# Patient Record
Sex: Male | Born: 1950 | Race: White | Hispanic: No | Marital: Single
Health system: Midwestern US, Community
[De-identification: ages and names within clinical notes are randomized; demographics above are authoritative.]

## PROBLEM LIST (undated history)

## (undated) DIAGNOSIS — E785 Hyperlipidemia, unspecified: Secondary | ICD-10-CM

## (undated) DIAGNOSIS — K219 Gastro-esophageal reflux disease without esophagitis: Secondary | ICD-10-CM

## (undated) DIAGNOSIS — Z87442 Personal history of urinary calculi: Secondary | ICD-10-CM

## (undated) DIAGNOSIS — N200 Calculus of kidney: Secondary | ICD-10-CM

## (undated) DIAGNOSIS — R0789 Other chest pain: Secondary | ICD-10-CM

## (undated) DIAGNOSIS — E669 Obesity, unspecified: Secondary | ICD-10-CM

## (undated) DIAGNOSIS — I10 Essential (primary) hypertension: Secondary | ICD-10-CM

## (undated) DIAGNOSIS — M1990 Unspecified osteoarthritis, unspecified site: Secondary | ICD-10-CM

## (undated) DIAGNOSIS — I350 Nonrheumatic aortic (valve) stenosis: Secondary | ICD-10-CM

## (undated) DIAGNOSIS — R011 Cardiac murmur, unspecified: Secondary | ICD-10-CM

## (undated) DIAGNOSIS — R739 Hyperglycemia, unspecified: Secondary | ICD-10-CM

## (undated) DIAGNOSIS — R5383 Other fatigue: Secondary | ICD-10-CM

## (undated) DIAGNOSIS — Z0000 Encounter for general adult medical examination without abnormal findings: Secondary | ICD-10-CM

## (undated) DIAGNOSIS — N529 Male erectile dysfunction, unspecified: Secondary | ICD-10-CM

## (undated) DIAGNOSIS — E039 Hypothyroidism, unspecified: Secondary | ICD-10-CM

## (undated) DIAGNOSIS — R1012 Left upper quadrant pain: Secondary | ICD-10-CM

## (undated) DIAGNOSIS — R5381 Other malaise: Secondary | ICD-10-CM

## (undated) DIAGNOSIS — L089 Local infection of the skin and subcutaneous tissue, unspecified: Secondary | ICD-10-CM

## (undated) DIAGNOSIS — Z13220 Encounter for screening for lipoid disorders: Secondary | ICD-10-CM

## (undated) DIAGNOSIS — S81812A Laceration without foreign body, left lower leg, initial encounter: Secondary | ICD-10-CM

## (undated) DIAGNOSIS — M25512 Pain in left shoulder: Secondary | ICD-10-CM

## (undated) DIAGNOSIS — M79602 Pain in left arm: Secondary | ICD-10-CM

## (undated) DIAGNOSIS — Z139 Encounter for screening, unspecified: Secondary | ICD-10-CM

## (undated) DIAGNOSIS — K635 Polyp of colon: Secondary | ICD-10-CM

## (undated) DIAGNOSIS — M79675 Pain in left toe(s): Principal | ICD-10-CM

## (undated) DIAGNOSIS — L309 Dermatitis, unspecified: Secondary | ICD-10-CM

## (undated) HISTORY — DX: Essential (primary) hypertension: I10

## (undated) HISTORY — DX: Calculus of kidney: N20.0

## (undated) HISTORY — PX: APPENDECTOMY: SHX54

## (undated) HISTORY — DX: Atherosclerotic heart disease of native coronary artery without angina pectoris: I25.10

## (undated) HISTORY — DX: Hyperglycemia, unspecified: R73.9

## (undated) HISTORY — DX: Obesity, unspecified: E66.9

## (undated) HISTORY — DX: Hyperlipidemia, unspecified: E78.5

## (undated) HISTORY — DX: Unspecified osteoarthritis, unspecified site: M19.90

## (undated) HISTORY — DX: Other chest pain: R07.89

## (undated) HISTORY — DX: Nonrheumatic aortic (valve) stenosis: I35.0

---

## 1999-10-03 ENCOUNTER — Ambulatory Visit (HOSPITAL_COMMUNITY): Admission: RE | Admit: 1999-10-03 | Discharge: 1999-10-03 | Payer: Self-pay | Admitting: Cardiology

## 2001-02-28 ENCOUNTER — Encounter: Payer: Self-pay | Admitting: Emergency Medicine

## 2001-02-28 ENCOUNTER — Emergency Department (HOSPITAL_COMMUNITY): Admission: EM | Admit: 2001-02-28 | Discharge: 2001-02-28 | Payer: Self-pay | Admitting: Emergency Medicine

## 2001-08-11 ENCOUNTER — Ambulatory Visit (HOSPITAL_BASED_OUTPATIENT_CLINIC_OR_DEPARTMENT_OTHER): Admission: RE | Admit: 2001-08-11 | Discharge: 2001-08-11 | Payer: Self-pay | Admitting: Orthopedic Surgery

## 2007-05-16 ENCOUNTER — Ambulatory Visit: Payer: Self-pay | Admitting: Gastroenterology

## 2007-05-26 ENCOUNTER — Encounter: Payer: Self-pay | Admitting: Gastroenterology

## 2007-05-26 ENCOUNTER — Ambulatory Visit: Payer: Self-pay | Admitting: Gastroenterology

## 2008-07-30 ENCOUNTER — Encounter: Admission: RE | Admit: 2008-07-30 | Discharge: 2008-07-30 | Payer: Self-pay | Admitting: Emergency Medicine

## 2010-04-17 ENCOUNTER — Encounter: Payer: Self-pay | Admitting: Emergency Medicine

## 2010-08-08 NOTE — Assessment & Plan Note (Signed)
[facility]                         GASTROENTEROLOGY OFFICE NOTE   NAME:[name], [name]                     MRN:          [id number]  DATE:05/16/2007                            DOB:          07-30-1950    REASON FOR CONSULTATION:  Colorectal cancer screening on Aggrenox.   HISTORY OF PRESENT ILLNESS:  Malik [name] is a 60 year old white male  who relates a history of a prior cerebrovascular accident.  He is  followed by Dr. Avie [name] and he has been maintained on Aggrenox for  about the past 18 months.  He states he has occasional hemorrhoidal  swelling but no bleeding or itching.  He notes no change in bowel  movements, melena, hematochezia, or change in stool caliber.  There is  no family history of colon cancer, colon polyps, or inflammatory bowel  disease.   PAST MEDICAL HISTORY:  1. Hypertension.  2. Prior cerebrovascular accident.  3. Possible sleep apnea.   PAST SURGICAL HISTORY:  1. Status post left knee surgery for repair of a meniscus tear, 2003.  2. Status post appendectomy, 1968.   CURRENT MEDICATIONS:  Listed on the chart updated and reviewed.   MEDICATION ALLERGIES:  None known.   SOCIAL HISTORY:  Per the handwritten form.   REVIEW OF SYSTEMS:  Per the handwritten form.   PHYSICAL EXAMINATION:  GENERAL:  An obese white male in no acute  distress.  VITAL SIGNS:  Height 6 feet, weight 332.6 pounds, blood pressure is  116/80, pulse 64 and regular.  HEENT:  Anicteric sclerae.  Oropharynx clear.  CHEST:  Clear to auscultation bilaterally.  CARDIAC:  Regular rate and rhythm without murmurs appreciated.  ABDOMEN:  Large, soft, nontender, nondistended.  Normoactive bowel  sounds.  No palpable organomegaly, masses, or hernias.  RECTAL:  Deferred to time of colonoscopy.  EXTREMITIES:  Without clubbing, cyanosis, or edema.  NEUROLOGIC:  Alert and oriented x3.  Grossly nonfocal.   ASSESSMENT/PLAN:  Average risk for colon cancer.   Colonoscopy  recommended for colorectal cancer screening.  Risks, benefits, and  alternatives to colonoscopy with possible biopsy and possible  polypectomy, performed off Aggrenox for 7 days, discussed with the  patient and he consents to proceed.  This will be scheduled electively.  We will obtain clearance from Dr. Sandria [name] for a temporary hold on Aggrenox  with plan to restart Aggrenox on the day of the procedure.     Venita Lick. Russella Dar, MD, [facility]  Electronically Signed    MTS/MedQ  DD: 05/16/2007  DT: 05/17/2007  Job #: [id number]   cc:   Malik Lacks, MD  Stan Head Malik [name], M.D.

## 2010-08-11 NOTE — Cardiovascular Report (Signed)
[facility]  Patient:    Malik [name], Malik [name]                     MRN: [id number] Proc. Date: 10/03/99 Adm. Date:  [id number] Attending:  [place], Peter Manning CC:         [name], M.D.                        Cardiac Catheterization  INDICATION FOR PROCEDURE:  The patient is a 60 year old white male, who presents with refractory chest pain symptoms.  Prior adenosine Cardiolite study was equivocal.  The patient has multiple cardiac risk factors including hyperlipidemia, hypertension, obesity, and family history of early coronary disease.  ACCESS:  Via the right femoral artery using the standard Seldinger technique.  EQUIPMENT:  A 6 French 4 cm right and left Judkins catheter, 6 French pigtail catheter, 6 French arterial sheath.  MEDICATIONS:  Local anesthesia with 1% Xylocaine.  CONTRAST:  Omnipaque 140 cc.  HEMODYNAMIC DATA:  Aortic pressure is 130/86, left ventricular pressure is 126 with an EDP of 20.  ANGIOGRAPHIC DATA:  Left coronary artery:  The left coronary artery arises and distributes normally.  Left main:  The left main coronary artery is normal.  Left anterior descending:  The left anterior descending artery is normal.  Left circumflex:  The left circumflex coronary artery gives rise to a single large obtuse marginal vessel and then proceeds in the AV groove.  The circumflex distribution has minor wall irregularities less than 10%.  Right coronary artery:  The right coronary artery arises and distributes normally and is a dominant vessel.  There are minor wall irregularities in the proximal mid vessel that are less than 10%.  LEFT VENTRICULAR ANGIOGRAPHY:  The left ventricular angiography was performed in the RAO view.  This demonstrates normal left ventricular size and contractility with normal systolic function.  The ejection fraction is difficult to estimate due to catheter-induced PVCs, but appears within normal limits,  55-65%.  There is no significant mitral insufficiency.  FINAL INTERPRETATION: 1. No significant atherosclerotic coronary artery disease. 2. Normal left ventricular function. DD:  10/03/99 TD:  10/03/99 Job: [id number]

## 2011-05-09 ENCOUNTER — Other Ambulatory Visit: Payer: Self-pay | Admitting: Family Medicine

## 2011-05-09 MED ORDER — SIMVASTATIN 80 MG PO TABS: 80.0000 mg | ORAL_TABLET | Freq: Every day | ORAL | Status: DC

## 2011-05-10 ENCOUNTER — Other Ambulatory Visit: Payer: Self-pay

## 2011-05-10 MED ORDER — METOPROLOL SUCCINATE ER 100 MG PO TB24: 100.0000 mg | ORAL_TABLET | Freq: Every day | ORAL | Status: DC

## 2011-07-10 ENCOUNTER — Ambulatory Visit: Payer: Self-pay | Admitting: Emergency Medicine

## 2011-08-12 ENCOUNTER — Other Ambulatory Visit: Payer: Self-pay | Admitting: Emergency Medicine

## 2011-08-21 ENCOUNTER — Encounter: Payer: Self-pay | Admitting: Emergency Medicine

## 2011-08-21 ENCOUNTER — Ambulatory Visit: Payer: 59

## 2011-08-21 ENCOUNTER — Ambulatory Visit (INDEPENDENT_AMBULATORY_CARE_PROVIDER_SITE_OTHER): Payer: 59 | Admitting: Emergency Medicine

## 2011-08-21 VITALS — BP 129/76 | HR 68 | Temp 96.4°F | Resp 16 | Ht 71.5 in | Wt 321.0 lb

## 2011-08-21 DIAGNOSIS — E785 Hyperlipidemia, unspecified: Secondary | ICD-10-CM

## 2011-08-21 DIAGNOSIS — I6529 Occlusion and stenosis of unspecified carotid artery: Secondary | ICD-10-CM

## 2011-08-21 DIAGNOSIS — M1990 Unspecified osteoarthritis, unspecified site: Secondary | ICD-10-CM

## 2011-08-21 DIAGNOSIS — M159 Polyosteoarthritis, unspecified: Secondary | ICD-10-CM

## 2011-08-21 DIAGNOSIS — R739 Hyperglycemia, unspecified: Secondary | ICD-10-CM

## 2011-08-21 DIAGNOSIS — R7989 Other specified abnormal findings of blood chemistry: Secondary | ICD-10-CM

## 2011-08-21 DIAGNOSIS — E782 Mixed hyperlipidemia: Secondary | ICD-10-CM

## 2011-08-21 DIAGNOSIS — I10 Essential (primary) hypertension: Secondary | ICD-10-CM

## 2011-08-21 LAB — LIPID PANEL
Cholesterol: 115 mg/dL (ref 0–200)
HDL: 23 mg/dL — ABNORMAL LOW (ref >39)
Total CHOL/HDL Ratio: 5 Ratio
VLDL: 20 mg/dL (ref 0–40)

## 2011-08-21 LAB — COMPREHENSIVE METABOLIC PANEL
AST: 18 U/L (ref 0–37)
Albumin: 4.3 g/dL (ref 3.5–5.2)
Alkaline Phosphatase: 58 U/L (ref 39–117)
BUN: 17 mg/dL (ref 6–23)
Potassium: 4 mEq/L (ref 3.5–5.3)
Total Bilirubin: 0.7 mg/dL (ref 0.3–1.2)

## 2011-08-21 MED ORDER — LISINOPRIL 20 MG PO TABS: 20.0000 mg | ORAL_TABLET | Freq: Every day | ORAL | Status: DC

## 2011-08-21 MED ORDER — HYDROCHLOROTHIAZIDE 25 MG PO TABS: 25.0000 mg | ORAL_TABLET | Freq: Every day | ORAL | Status: DC

## 2011-08-21 MED ORDER — MELOXICAM 15 MG PO TABS: 15.0000 mg | ORAL_TABLET | Freq: Every day | ORAL | Status: AC

## 2011-08-21 MED ORDER — METOPROLOL SUCCINATE ER 100 MG PO TB24: 100.0000 mg | ORAL_TABLET | Freq: Every day | ORAL | Status: DC

## 2011-08-21 MED ORDER — SIMVASTATIN 80 MG PO TABS: 80.0000 mg | ORAL_TABLET | Freq: Every day | ORAL | Status: DC

## 2011-08-21 NOTE — Progress Notes (Signed)
  Subjective:    Patient ID: Malik [name], male    DOB: [birth year], 61 y.o.   MRN: [id number]  HPI    Review of Systems     Objective:   Physical Exam    [facility] reading (PRIMARY) by  Dr. Cleta Alberts x-ray reveals narrowing of the medial joint space without other acute abnormality . Results for orders placed in visit on 08/21/11  GLUCOSE, POCT (MANUAL RESULT ENTRY)      Component Value Range   POC Glucose 99  70 - 99 (mg/dl)  POCT GLYCOSYLATED HEMOGLOBIN (HGB A1C)      Component Value Range   Hemoglobin A1C 5.6       Assessment & Plan:  Patient stable. meloxicam refilled

## 2011-08-24 ENCOUNTER — Ambulatory Visit
Admission: RE | Admit: 2011-08-24 | Discharge: 2011-08-24 | Disposition: A | Discharge status: Home / Self Care | Payer: 59 | Source: Ambulatory Visit | Attending: Emergency Medicine | Admitting: Emergency Medicine

## 2011-08-24 DIAGNOSIS — I6529 Occlusion and stenosis of unspecified carotid artery: Secondary | ICD-10-CM

## 2011-08-29 ENCOUNTER — Telehealth: Payer: Self-pay | Admitting: Radiology

## 2011-08-29 NOTE — Telephone Encounter (Signed)
lmom of normal results. 

## 2011-08-29 NOTE — Telephone Encounter (Signed)
Message copied by Luretha Murphy on Wed Aug 29, 2011  8:48 AM ------      Message from: [name] A      Created: Fri Aug 24, 2011  7:57 PM       Please call patient let them know there was no significant stenosis on his ultrasound study.

## 2011-09-25 ENCOUNTER — Other Ambulatory Visit: Payer: Self-pay | Admitting: *Deleted

## 2011-09-25 DIAGNOSIS — I10 Essential (primary) hypertension: Secondary | ICD-10-CM

## 2011-09-25 MED ORDER — HYDROCHLOROTHIAZIDE 25 MG PO TABS: 25.0000 mg | ORAL_TABLET | Freq: Every day | ORAL | Status: DC

## 2011-12-18 ENCOUNTER — Ambulatory Visit (INDEPENDENT_AMBULATORY_CARE_PROVIDER_SITE_OTHER): Payer: 59 | Admitting: Emergency Medicine

## 2011-12-18 VITALS — BP 113/78 | HR 67 | Temp 96.8°F | Resp 16 | Ht 72.0 in | Wt 324.0 lb

## 2011-12-18 DIAGNOSIS — M1990 Unspecified osteoarthritis, unspecified site: Secondary | ICD-10-CM

## 2011-12-18 DIAGNOSIS — R739 Hyperglycemia, unspecified: Secondary | ICD-10-CM | POA: Insufficient documentation

## 2011-12-18 DIAGNOSIS — E669 Obesity, unspecified: Secondary | ICD-10-CM | POA: Insufficient documentation

## 2011-12-18 DIAGNOSIS — E785 Hyperlipidemia, unspecified: Secondary | ICD-10-CM

## 2011-12-18 DIAGNOSIS — R7309 Other abnormal glucose: Secondary | ICD-10-CM

## 2011-12-18 DIAGNOSIS — I10 Essential (primary) hypertension: Secondary | ICD-10-CM

## 2011-12-18 DIAGNOSIS — Z23 Encounter for immunization: Secondary | ICD-10-CM

## 2011-12-18 LAB — POCT GLYCOSYLATED HEMOGLOBIN (HGB A1C): Hemoglobin A1C: 6

## 2011-12-18 NOTE — Progress Notes (Signed)
  Subjective:    Patient ID: Malik [name], male    DOB: 1950/10/31, 61 y.o.   MRN: [id number]  HPI patient enters for followup of high blood pressure hyperlipidemia, hyperglycemia. Last hemoglobin A1c was in range. Since his last visit here he has done great well without complaints he denies chest pain shortness of breath problems with his lower extremities. He continues to exercise everyday.    Review of Systems     Objective:   Physical Exam HEENT exam is unremarkable. His neck supple without carotid bruits. Chest is clear to auscultation and percussion. Cardiac exam is without murmur extremity exam reveals onychomycosis dorsalis pedis posterior tibial pulses 2+ without peripheral edema.  Results for orders placed in visit on 12/18/11  GLUCOSE, POCT (MANUAL RESULT ENTRY)      Component Value Range   POC Glucose 105 (*) 70 - 99 mg/dl  POCT GLYCOSYLATED HEMOGLOBIN (HGB A1C)      Component Value Range   Hemoglobin A1C 6.0          Assessment & Plan:  Stable at present hemoglobin A1c is at goal. No change in medications at present we'll do a complete physical in 3-4 months.

## 2011-12-18 NOTE — Patient Instructions (Signed)
Continued to work on diet and exercise and weight loss. No change in current medications. Recheck in 3-4 months with complete physical at that time.

## 2012-05-06 ENCOUNTER — Ambulatory Visit (INDEPENDENT_AMBULATORY_CARE_PROVIDER_SITE_OTHER): Payer: 59 | Admitting: Emergency Medicine

## 2012-05-06 ENCOUNTER — Ambulatory Visit: Payer: 59

## 2012-05-06 ENCOUNTER — Encounter: Payer: Self-pay | Admitting: Emergency Medicine

## 2012-05-06 VITALS — BP 138/78 | HR 74 | Temp 97.6°F | Resp 16 | Ht 72.0 in | Wt 322.0 lb

## 2012-05-06 DIAGNOSIS — E785 Hyperlipidemia, unspecified: Secondary | ICD-10-CM

## 2012-05-06 DIAGNOSIS — M79643 Pain in unspecified hand: Secondary | ICD-10-CM

## 2012-05-06 DIAGNOSIS — M79609 Pain in unspecified limb: Secondary | ICD-10-CM

## 2012-05-06 DIAGNOSIS — E119 Type 2 diabetes mellitus without complications: Secondary | ICD-10-CM

## 2012-05-06 LAB — LIPID PANEL
Cholesterol: 132 mg/dL (ref 0–200)
Triglycerides: 121 mg/dL (ref <150)
VLDL: 24 mg/dL (ref 0–40)

## 2012-05-06 NOTE — Progress Notes (Signed)
  Subjective:    Patient ID: Malik [name], male    DOB: 1950-10-05, 62 y.o.   MRN: [id number]  HPI patient here to recheck diabetes high blood high cholesterol. He may have altered some over the holidays but overall has been doing well. He continues to exercise regularly. He has had no chest pain shortness of breath or swelling in his legs. Sensation maintains normal feeling in his legs    Review of Systems     Objective:   Physical Exam HEENT exam is unremarkable. His neck is supple. Chest clear to auscultation and percussion. Heart regular rate no murmurs. Abdomen obese without masses extremities he does have normal filament testing he does have significant onychomycosis  [facility] reading (PRIMARY) by  Dr.Daub from film is normal there are 2 accessory bones around the MCP joint of the thumb       Results for orders placed in visit on 05/06/12  GLUCOSE, POCT (MANUAL RESULT ENTRY)      Result Value Range   POC Glucose 103 (*) 70 - 99 mg/dl  POCT GLYCOSYLATED HEMOGLOBIN (HGB A1C)      Result Value Range   Hemoglobin A1C 5.6     Assessment & Plan:  Patient stable at present he does not want to see the orthopedist. No changes in medication. His hemoglobin A1c is 5.6 which is normal due to his continued exercise program

## 2012-08-28 ENCOUNTER — Telehealth: Payer: Self-pay | Admitting: Radiology

## 2012-08-28 DIAGNOSIS — I10 Essential (primary) hypertension: Secondary | ICD-10-CM

## 2012-08-28 MED ORDER — HYDROCHLOROTHIAZIDE 25 MG PO TABS: 25.0000 mg | ORAL_TABLET | Freq: Every day | ORAL | Status: DC

## 2012-08-28 MED ORDER — LISINOPRIL 20 MG PO TABS: 20.0000 mg | ORAL_TABLET | Freq: Every day | ORAL | Status: DC

## 2012-08-28 NOTE — Telephone Encounter (Signed)
Patients meds sent in for one month supply he is due for follow up prior to additional refills.

## 2012-09-16 ENCOUNTER — Encounter: Payer: Self-pay | Admitting: Emergency Medicine

## 2012-09-16 ENCOUNTER — Ambulatory Visit (INDEPENDENT_AMBULATORY_CARE_PROVIDER_SITE_OTHER): Payer: 59 | Admitting: Emergency Medicine

## 2012-09-16 VITALS — BP 130/84 | HR 71 | Temp 97.6°F | Resp 16 | Ht 72.0 in | Wt 325.4 lb

## 2012-09-16 DIAGNOSIS — E782 Mixed hyperlipidemia: Secondary | ICD-10-CM

## 2012-09-16 DIAGNOSIS — Z0000 Encounter for general adult medical examination without abnormal findings: Secondary | ICD-10-CM

## 2012-09-16 DIAGNOSIS — I10 Essential (primary) hypertension: Secondary | ICD-10-CM

## 2012-09-16 DIAGNOSIS — R011 Cardiac murmur, unspecified: Secondary | ICD-10-CM

## 2012-09-16 DIAGNOSIS — E785 Hyperlipidemia, unspecified: Secondary | ICD-10-CM

## 2012-09-16 LAB — CBC WITH DIFFERENTIAL/PLATELET
Eosinophils Relative: 3 % (ref 0–5)
HCT: 46.2 % (ref 39.0–52.0)
Hemoglobin: 16 g/dL (ref 13.0–17.0)
Lymphocytes Relative: 31 % (ref 12–46)
Lymphs Abs: 1.5 10*3/uL (ref 0.7–4.0)
MCV: 88 fL (ref 78.0–100.0)
Monocytes Absolute: 0.6 10*3/uL (ref 0.1–1.0)
Monocytes Relative: 11 % (ref 3–12)
RBC: 5.25 MIL/uL (ref 4.22–5.81)
RDW: 13.6 % (ref 11.5–15.5)
WBC: 5 10*3/uL (ref 4.0–10.5)

## 2012-09-16 LAB — POCT URINALYSIS DIPSTICK
Bilirubin, UA: NEGATIVE
Ketones, UA: NEGATIVE
Protein, UA: NEGATIVE
Spec Grav, UA: 1.015

## 2012-09-16 LAB — COMPREHENSIVE METABOLIC PANEL
Albumin: 4.4 g/dL (ref 3.5–5.2)
BUN: 14 mg/dL (ref 6–23)
CO2: 30 mEq/L (ref 19–32)
Calcium: 10.6 mg/dL — ABNORMAL HIGH (ref 8.4–10.5)
Chloride: 99 mEq/L (ref 96–112)
Creat: 1.08 mg/dL (ref 0.50–1.35)
Potassium: 4.1 mEq/L (ref 3.5–5.3)

## 2012-09-16 LAB — TSH: TSH: 2.383 u[IU]/mL (ref 0.350–4.500)

## 2012-09-16 LAB — LIPID PANEL
HDL: 31 mg/dL — ABNORMAL LOW (ref >39)
Triglycerides: 152 mg/dL — ABNORMAL HIGH (ref <150)

## 2012-09-16 LAB — PSA: PSA: 0.62 ng/mL (ref <=4.00)

## 2012-09-16 MED ORDER — SIMVASTATIN 80 MG PO TABS: 80.0000 mg | ORAL_TABLET | Freq: Every day | ORAL | Status: DC

## 2012-09-16 MED ORDER — METOPROLOL SUCCINATE ER 100 MG PO TB24: 100.0000 mg | ORAL_TABLET | Freq: Every day | ORAL | Status: DC

## 2012-09-16 MED ORDER — HYDROCHLOROTHIAZIDE 25 MG PO TABS: 25.0000 mg | ORAL_TABLET | Freq: Every day | ORAL | Status: DC

## 2012-09-16 MED ORDER — LISINOPRIL 20 MG PO TABS: 20.0000 mg | ORAL_TABLET | Freq: Every day | ORAL | Status: DC

## 2012-09-16 NOTE — Progress Notes (Signed)
  Subjective:    Patient ID: Malik [name], male    DOB: 02/18/1951, 62 y.o.   MRN: [id number]  HPI    Review of Systems  Constitutional: Negative.   HENT: Negative.   Eyes: Negative.   Respiratory: Negative.   Cardiovascular: Negative.   Gastrointestinal: Negative.   Endocrine: Negative.   Genitourinary: Negative.   Musculoskeletal: Negative.   Skin: Negative.   Allergic/Immunologic: Negative.   Neurological: Negative.   Hematological: Negative.   Psychiatric/Behavioral: Negative.        Objective:   Physical Exam reveals an overweight gentleman who is in no distress. Pupils are equal and reactive. TMs are clear. Throat is normal. Chest clear to auscultation percussion. Heart regular rate there is a 2/6 systolic murmur at the left are more. Abdomen is obese without tenderness genitourinary is normal. Rectal exam is normal prostate. He was sure was obtained.  Results for orders placed in visit on 09/16/12  IFOBT (OCCULT BLOOD)      Result Value Range   IFOBT Negative    POCT URINALYSIS DIPSTICK      Result Value Range   Color, UA yellow     Clarity, UA clear     Glucose, UA neg     Bilirubin, UA neg     Ketones, UA neg     Spec Grav, UA 1.015     Blood, UA trace     pH, UA 5.5     Protein, UA neg     Urobilinogen, UA 0.2     Nitrite, UA neg     Leukocytes, UA Negative          Assessment & Plan:  Meds refilled for one year recheck 3-4 months .

## 2012-09-23 LAB — HEMOGLOBIN A1C
Estimated Avg Glucose: 108 mg/dL
Hemoglobin A1C: 5.4 % (ref 4.0–6.0)

## 2012-09-23 LAB — DHEA-SULFATE: DHEAS (DHEA Sulfate): 54.1 ug/dL (ref 42–290)

## 2012-09-26 LAB — ESTROGENS, FRACTIONATED
Estradiol: 20.1 pg/mL
Estrogen Total: 40.8 pg/mL
Estrone: 20.7 pg/mL

## 2012-11-17 ENCOUNTER — Ambulatory Visit (INDEPENDENT_AMBULATORY_CARE_PROVIDER_SITE_OTHER): Payer: 59 | Admitting: Cardiovascular Disease

## 2012-11-17 ENCOUNTER — Encounter: Payer: Self-pay | Admitting: *Deleted

## 2012-11-17 VITALS — BP 148/90 | HR 83 | Wt 326.0 lb

## 2012-11-17 DIAGNOSIS — R011 Cardiac murmur, unspecified: Secondary | ICD-10-CM | POA: Insufficient documentation

## 2012-11-17 DIAGNOSIS — R0600 Dyspnea, unspecified: Secondary | ICD-10-CM | POA: Insufficient documentation

## 2012-11-17 DIAGNOSIS — E785 Hyperlipidemia, unspecified: Secondary | ICD-10-CM

## 2012-11-17 DIAGNOSIS — I10 Essential (primary) hypertension: Secondary | ICD-10-CM

## 2012-11-17 DIAGNOSIS — E669 Obesity, unspecified: Secondary | ICD-10-CM

## 2012-11-17 NOTE — Patient Instructions (Addendum)
  Your physician recommends that you schedule a follow-up appointment in: AS NEEDED  Your physician recommends that you continue on your current medications as directed. Please refer to the Current Medication list given to you today.  Your physician has requested that you have an exercise tolerance test. For further information please visit [email or web address] Please also follow instruction sheet, as given.   Your physician has requested that you have an echocardiogram. Echocardiography is a painless test that uses sound waves to create images of your heart. It provides your doctor with information about the size and shape of your heart and how well your heart's chambers and valves are working. This procedure takes approximately one hour. There are no restrictions for this procedure. CALCIUM  SCORE ONLY OUT OF  POCKET  $150.00

## 2012-11-17 NOTE — Assessment & Plan Note (Signed)
Cholesterol is at goal.  Continue current dose of statin and diet Rx.  No myalgias or side effects.  F/U  LFT's in 6 months. Lab Results  Component Value Date   LDLCALC 71 09/16/2012

## 2012-11-17 NOTE — Assessment & Plan Note (Signed)
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

## 2012-11-17 NOTE — Assessment & Plan Note (Signed)
Discussed low carb diet and lap band surgery with patient.  He has lots of work to do on diet and exercise

## 2012-11-17 NOTE — Progress Notes (Signed)
Patient ID: Malik [name], male   DOB: 1950-04-30, 62 y.o.   MRN: [id number] 62 yo with HTN and elevated lipids  Referred by primary Daub for murmur  Not previously described He is a retired Theatre stage manager.  He has bad knees that limits exercise His diet is poor and he likes to eat too much.  Exertional dyspnea worse over the last year.  Compliant with meds.  No chest pain but limited activity due to knee pain and dyspnea  TC 132 LDL 71 from 09/17/12  ROS: Denies fever, malais, weight loss, blurry vision, decreased visual acuity, cough, sputum, SOB, hemoptysis, pleuritic pain, palpitaitons, heartburn, abdominal pain, melena, lower extremity edema, claudication, or rash.  All other systems reviewed and negative   General: Affect appropriate Obese white male  HEENT: normal Neck supple with no adenopathy JVP normal no bruits no thyromegaly Lungs clear with no wheezing and good diaphragmatic motion Heart:  S1/S2 systolic ejection murmur 3/6 no ,rub, gallop or click PMI normal Abdomen: benighn, BS positve, no tenderness, no AAA no bruit.  No HSM or HJR Distal pulses intact with no bruits No edema Neuro non-focal Skin warm and dry No muscular weakness  Medications Current Outpatient Prescriptions  Medication Sig Dispense Refill  . aspirin 325 MG EC tablet Take 325 mg by mouth daily.      . hydrochlorothiazide (HYDRODIURIL) 25 MG tablet Take 1 tablet (25 mg total) by mouth daily. NEED REFILLS FOR 1 YEAR  90 tablet  3  . lisinopril (PRINIVIL,ZESTRIL) 20 MG tablet Take 1 tablet (20 mg total) by mouth daily. NEED REFILLS FOR 1 YEAR  90 tablet  3  . metoprolol succinate (TOPROL-XL) 100 MG 24 hr tablet Take 1 tablet (100 mg total) by mouth daily. Take with or immediately following a meal. NEED REFILLS FOR 1 YEAR  90 tablet  3  . simvastatin (ZOCOR) 80 MG tablet Take 1 tablet (80 mg total) by mouth at bedtime. NEED REFILLS FOR  1 YEAR  90 tablet  3   No current facility-administered medications  for this visit.    Allergies Review of patient's allergies indicates no known allergies.  Family History: Family History  Problem Relation Age of Onset  . Cancer Mother   . Heart disease Father     Social History: History   Social History  . Marital Status: Divorced    Spouse Name: N/A    Number of Children: N/A  . Years of Education: N/A   Occupational History  . Not on file.   Social History Main Topics  . Smoking status: Former Smoker -- 20 years    Types: Cigarettes, Cigars    Quit date: 08/19/1978  . Smokeless tobacco: Not on file  . Alcohol Use: No  . Drug Use: Not on file  . Sexual Activity: Not on file   Other Topics Concern  . Not on file   Social History Narrative  . No narrative on file    Electrocardiogram:  SR rate 83 normal ECG   Assessment and Plan

## 2012-11-17 NOTE — Assessment & Plan Note (Signed)
Likely from obesity  F/U ETT  To clear for exercise program and possible lab band eval.

## 2012-11-17 NOTE — Assessment & Plan Note (Signed)
Well controlled.  Continue current medications and low sodium Dash type diet.    

## 2012-11-25 ENCOUNTER — Encounter

## 2012-11-25 MED ORDER — SILDENAFIL CITRATE 100 MG PO TABS
100 | ORAL_TABLET | ORAL | Status: DC | PRN
Start: 2012-11-25 — End: 2012-11-28

## 2012-11-25 NOTE — Telephone Encounter (Signed)
rx printed; ok to fax

## 2012-11-25 NOTE — Telephone Encounter (Signed)
PT CALLING INTO REQUEST WE FAX OVER A NEW ORDER FOR HIS VIAGRA TO [facility] [phone] PLEASE ADVISE

## 2012-11-28 ENCOUNTER — Encounter

## 2012-12-02 MED ORDER — SILDENAFIL CITRATE 100 MG PO TABS
100 | ORAL_TABLET | ORAL | Status: DC | PRN
Start: 2012-12-02 — End: 2014-05-13

## 2012-12-02 NOTE — Telephone Encounter (Signed)
Pt needs to pick up viagra rx to [facility] to [facility], since it is in [place].    [facility]

## 2012-12-02 NOTE — Telephone Encounter (Signed)
Ok per [employer]

## 2012-12-03 MED ORDER — SILDENAFIL CITRATE 100 MG PO TABS
100 | ORAL_TABLET | ORAL | Status: DC | PRN
Start: 2012-12-03 — End: 2012-12-05

## 2012-12-03 NOTE — Telephone Encounter (Signed)
This is done

## 2012-12-05 ENCOUNTER — Encounter: Payer: Self-pay | Admitting: *Deleted

## 2012-12-05 MED ORDER — SILDENAFIL CITRATE 100 MG PO TABS
100 | ORAL_TABLET | ORAL | Status: DC | PRN
Start: 2012-12-05 — End: 2013-11-26

## 2012-12-09 ENCOUNTER — Ambulatory Visit (INDEPENDENT_AMBULATORY_CARE_PROVIDER_SITE_OTHER): Payer: 59 | Admitting: Physician Assistant

## 2012-12-09 DIAGNOSIS — R0602 Shortness of breath: Secondary | ICD-10-CM

## 2012-12-09 DIAGNOSIS — R0600 Dyspnea, unspecified: Secondary | ICD-10-CM

## 2012-12-09 NOTE — Progress Notes (Signed)
Exercise Treadmill Test  Pre-Exercise Testing Evaluation Rhythm: normal sinus  Rate: 69     Test  Exercise Tolerance Test Ordering MD: [name], MD  Interpreting MD: Tereso Newcomer, PA-C  Unique Test No: 1  Treadmill:  1  Indication for ETT: exertional dyspnea  Contraindication to ETT: No   Stress Modality: exercise - treadmill  Cardiac Imaging Performed: non   Protocol: modified Bruce - maximal  Max BP:  192/57  Max MPHR (bpm):  158 85% MPR (bpm):  134  MPHR obtained (bpm):  141 % MPHR obtained:  89  Reached 85% MPHR (min:sec):  10:59 Total Exercise Time (min-sec):  12:00  Workload in METS:  7.0 Borg Scale: 15  Reason ETT Terminated:  patient's desire to stop    ST Segment Analysis At Rest: normal ST segments - no evidence of significant ST depression With Exercise: no evidence of significant ST depression  Other Information Arrhythmia:  No Angina during ETT:  absent (0) Quality of ETT:  diagnostic  ETT Interpretation:  normal - no evidence of ischemia by ST analysis  Comments: Fair exercise capacity. No chest pain. Normal BP response to exercise. No significant ST-T changes to suggest ischemia.   Recommendations: F/u with Dr. [name] as directed. Signed,  Tereso Newcomer, PA-C   12/09/2012 3:38 PM

## 2012-12-09 NOTE — Progress Notes (Signed)
Normal ETT 

## 2012-12-12 ENCOUNTER — Ambulatory Visit (HOSPITAL_COMMUNITY): Payer: 59 | Attending: Cardiology | Admitting: Radiology

## 2012-12-12 ENCOUNTER — Other Ambulatory Visit (HOSPITAL_COMMUNITY): Payer: 59 | Admitting: Radiology

## 2012-12-12 ENCOUNTER — Ambulatory Visit (INDEPENDENT_AMBULATORY_CARE_PROVIDER_SITE_OTHER)
Admission: RE | Admit: 2012-12-12 | Discharge: 2012-12-12 | Disposition: A | Discharge status: Home / Self Care | Payer: Self-pay | Source: Ambulatory Visit | Attending: Cardiovascular Disease | Admitting: Cardiovascular Disease

## 2012-12-12 DIAGNOSIS — R0609 Other forms of dyspnea: Secondary | ICD-10-CM

## 2012-12-12 DIAGNOSIS — R011 Cardiac murmur, unspecified: Secondary | ICD-10-CM

## 2012-12-12 DIAGNOSIS — I079 Rheumatic tricuspid valve disease, unspecified: Secondary | ICD-10-CM | POA: Insufficient documentation

## 2012-12-12 DIAGNOSIS — E669 Obesity, unspecified: Secondary | ICD-10-CM

## 2012-12-12 DIAGNOSIS — R0600 Dyspnea, unspecified: Secondary | ICD-10-CM

## 2012-12-12 MED ORDER — PERFLUTREN PROTEIN A MICROSPH IV SUSP
1.5000 mL | Freq: Once | INTRAVENOUS | Status: AC
  Administered 2012-12-12: 1.5 mL via INTRAVENOUS

## 2012-12-12 NOTE — Progress Notes (Signed)
Echocardiogram performed with [employer].  

## 2012-12-16 ENCOUNTER — Telehealth: Payer: Self-pay | Admitting: *Deleted

## 2012-12-16 NOTE — Telephone Encounter (Signed)
[name] More Detail >>      Wendall Stade, MD      Sent: Sat December 13, 2012  2:02 PM      To: Collene Gobble, MD; Alois Cliche, LPN              Message      [name] - make sure patient knows to have f/u no contrast CT in a year for LUL lung nodule seen by radiologist on calcium score.           Attached Reports    The sender attached the following reports to this message:                     Results   CT Cardiac Scoring (Order [id number])        CT Cardiac Scoring  Status: Edited Result - FINAL     Visible to patient: This result is not viewable by the patient.     Next appt: 01/06/2013 at 08:45 AM in Family Medicine (DAUB, Stan Head, MD)     Dx: Dyspnea; Obesity            Details    Reading Physician Reading Date Result Priority    Wendall Stade, MD [name], MD 12/12/2012 12/12/2012         Addendum    **ADDENDUM** CREATED: 12/12/2012 13:13:58  OVER-READ INTERPRETATION - CT CHEST  The following report is an over-read performed by radiologist Dr. [name], M.D. of [facility] on 12/12/2012 13:13:58.  This over-read does not include interpretation of cardiac or coronary anatomy or pathology.  The CTA interpretation by the cardiologist is attached.  Comparison:  07/30/2008  Findings: Within the left upper lobe there is a 5 mm, image 9/series 4.  This is new when compared with the previous exam.  No airspace consolidation or atelectasis identified.  The heart size appears normal.  There is no pericardial effusion.  No adenopathy identified.  The visualized portions of the esophagus appear normal.  Coronary artery calcifications are noted involving the LAD, left circumflex, and RCA coronary arteries.  Spondylosis is identified within the thoracic spine.  IMPRESSION:  1.  5 mm nodule is identified in the left upper lobe.  Not seen on previous exam. If the patient is at high risk for bronchogenic carcinoma,  follow-up chest CT at 6-12 months is recommended.  If the patient is at low risk for bronchogenic carcinoma, follow-up chest CT at 12 months is recommended.  This recommendation follows the consensus statement: Guidelines for Management of Small Pulmonary Nodules Detected on CT Scans: A Statement from the Fleischner Society as published in Radiology 2005; 237:395-400.  These results will be called to the ordering clinician or representative by the Radiologist Assistant, and communication documented in the PACS Dashboard.  **END ADDENDUM** SIGNED BY: [name], M.D.    Signed by [name], MD on 12/12/2012  1:20 PM       Narrative        Coronary Calcium Score:  Indication: Risk Stratification  Protocol:  The patient was scanned on a Siemens Sensation 16 slice scanner.  3mm axial non contrast images were carried out through the heart. The data set was scored on a dedicated work station using the [employer]   Findings:  Ascneding Aorta some calcification and mildly dilated 3.8 cm  Pericardium Normal  Calcium Score 141 scattered in the mid and  distal LAD , RCA and proximal circumflex  Impression:  Coronary calcium Score 141  This is 66th percentile for age and sex matched controls.  [name] MD [facility]  Original Report Authenticated By: [name], M.D.        Last Resulted: 12/12/12 10:19 AM                  Order Questions    Question Answer Comment    Reason for Exam (SYMPTOM  OR DIAGNOSIS REQUIRED) DYSPNEA  OBESITY      Note:  Appropriate reason for exam on pre-op X-rays should indicate the reason for the surgery. Examples include: Coronary Artery disease, osteoartiritis of the knee, brain tumor, etc.    Preferred imaging location? [address]                  Reviewed by List    Wendall Stade, MD on 12/13/2012  2:07 PM        Encounter    View Encounter       Result Information    Status    Edited Result - FINAL (12/12/2012  10:19 AM)    Provider Status: Reviewed        Lab Information     RADIOLOGY               PACS Images    Show images for CT Cardiac Scoring       Signed      Addended on 12/12/12 at 1019 EDT   Electronically signed on 12/12/12 at 1019 EDT       Order-Level Documents:    There are no order-level documents.        CT Cardiac Scoring (Order [id number])  Imaging  Order: [id number]   Released By: Automatic Release User  Authorizing: Wendall Stade, MD   Date: 12/12/2012  Department: [facility] CT IMAGING [address]               Wendall Stade, MD  NPI: [id number]             Order Information    Order Date/Time Release Date/Time Start Date/Time End Date/Time    12/12/12 08:57 AM 12/12/12 08:57 AM 12/12/12 08:57 AM 12/12/2012       Order Details    Frequency Duration Priority Order Class    As needed 1  occurrence Routine Ancillary Performed       Comments    RM/SHARON X879/NO LABS NEEDED/COLLECT $150.00 AT TIME OF VISIT, PT AWARE) (ECHO TO FOLLOW CT)       Order Questions    Question Answer Comment    Reason for Exam (SYMPTOM  OR DIAGNOSIS REQUIRED) DYSPNEA  OBESITY      Note:  Appropriate reason for exam on pre-op X-rays should indicate the reason for the surgery. Examples include: Coronary Artery disease, osteoartiritis of the knee, brain tumor, etc.    Preferred imaging location? [place]          Order History Inpatient    Date/Time Action Taken User Additional Information     0000 Result Marily Lente In process    12/12/12 8469 Release Elsie Saas From Order: [id number]    12/12/12 1019 Result Rad Results In Interface Final-Edited    12/12/12 1019 Result Rad Results In Interface Final           Associated Diagnoses    Dyspnea       Obesity  Appointments for this Order    12/12/2012  9:00 AM  - 30 min Lbct-Ct 1 (Resource) L[facility] Imaging        Collection Information    Resulting Agency: [facility]                   Verbal Order Info    Action Order Mode Communicator Responsible Provider Signed By Signed On    Ordering Verbal with readback Alois Cliche, LPN Wendall Stade, MD Wendall Stade, MD 11/17/12 1224         Patient Information    Patient Name Sex DOB SSN    Malik [name], Malik [name] Male July 13, 1950 [id number]       Additional Information    Associated Reports    View Parent Encounter    Priority and Order Details       Reviewed by List    Wendall Stade, MD on 12/13/2012  2:07 PM       Order Provider Info        Office phone Pager/beeper E-mail    Ordering User Alois Cliche, LPN [phone] -- [email or web address]    Authorizing Provider Wendall Stade, MD [phone] -- [email or web address]    Billing Provider Wendall Stade, MD [phone] -- [email or web address]         Encounter    View Encounter        Order-Level Documents:    There are no order-level documents.      LMTCB .Zack Seal

## 2012-12-16 NOTE — Progress Notes (Signed)
[facility] [place]  [address]  [place] [address]  Dept: [phone]  Dept Fax: [phone]    Dustin Zimmerman is a 62 y.o. male who presents today for his medical conditions/complaints as noted below.  Dustin Zimmerman is c/o of Rash      Have you seen any other physician or provider since your last visit? yes - ophthalmologist     Have you had any other diagnostic tests since your last visit? no    Have you changed or stopped any medications since your last visit including any over-the-counter medicines, vitamins, or herbal medicines? no     Are you taking all your prescribed medications? Yes  If NO, why? -  N/A           Patient Self-Management Goal for this visit.   What is your goal for your visit today? - discuss referral   Barriers to success: none   Plan for overcoming my barriers: N/A      Confidence: 10/10   Date goal set: 12/16/2012   Date expected to reach goal: 3months    HPI:     Rash  This is a recurrent problem. The current episode started more than 1 month ago (years ago). The problem has been gradually worsening since onset. The rash is diffuse. The rash is characterized by itchiness. Pertinent negatives include no shortness of breath. The treatment provided no relief.       Past Medical History   Diagnosis Date   ??? Hypogonadism    ??? Lumbago    ??? Malaise and fatigue    ??? Myalgia       Past Surgical History   Procedure Laterality Date   ??? Foot surgery       middle toe   ??? Knee surgery     ??? Shoulder surgery         No family history on file.    History   Substance Use Topics   ??? Smoking status: Never Smoker    ??? Smokeless tobacco: Never Used   ??? Alcohol Use: Yes      Current Outpatient Prescriptions   Medication Sig Dispense Refill   ??? dorzolamide-timolol (COSOPT) 22.3-6.8 MG/ML ophthalmic solution        ??? LOTEMAX 0.5 % ophthalmic gel        ??? levothyroxine (SYNTHROID) 50 MCG tablet Take 1 tablet by mouth daily.  90 tablet  3   ???  testosterone (ANDROGEL; TESTIM) 50 MG/5GM GEL 1% gel Apply 0.05 g topically daily.  90 Tube  3   ??? prednisoLONE acetate (PRED FORTE) 1 % ophthalmic suspension        ??? sildenafil (VIAGRA) 100 MG tablet Take 1 tablet by mouth as needed for Erectile Dysfunction.  40 tablet  3   ??? sildenafil (VIAGRA) 100 MG tablet Take 1 tablet by mouth as needed for Erectile Dysfunction.  40 tablet  1     No current facility-administered medications for this visit.     Allergies   Allergen Reactions   ??? Motrin [Ibuprofen]    ??? Percocet [Oxycodone-Acetaminophen] Hives       Health Maintenance   Topic Date Due   ??? FLU VACCINE YEARLY (ADULT)  01/23/2013   ??? ZOSTAVAX VACCINE  02/22/2013   ??? TETANUS VACCINE ADULT (11 YEARS AND UP)  12/17/2022   ??? COLON CANCER SCREENING COLONOSCOPY  12/17/2022       Subjective:  Review of Systems   Constitutional: Positive for appetite change (traveling to up due to sick mother). Negative for activity change.   Respiratory: Negative for chest tightness and shortness of breath.    Skin: Positive for rash.       Objective:     Physical Exam   Constitutional: He appears well-developed.   HENT:   Head: Normocephalic.   Eyes: Pupils are equal, round, and reactive to light.   Neck: Normal range of motion.   Cardiovascular: Normal rate.    Pulmonary/Chest: Effort normal.   Abdominal: Soft.   Musculoskeletal: Normal range of motion.   Skin: Rash (generalized; dry; also some duiscrete dark colored lesion) noted.   Nursing note and vitals reviewed.    BP 118/86   Pulse 74   Temp(Src) 98.2 ??F (36.8 ??C) (Oral)   Wt 202 lb (91.627 kg)   SpO2 97%    Assessment:      1. Hypothyroidism     2. Hypogonadism     3. Lumbago     4. Skin lesions  External Referral To Dermatology   5. Plantar wart of left foot         Plan:    1. Check labs next visit  2. Podiatry referral; he will call with name.  Return in about 6 months (around 06/15/2013).    Orders Placed This Encounter   Procedures   ??? External Referral To Dermatology      Referral Priority:  Routine     Referral Type:  Consult for Advice and Opinion     Referral Reason:  Specialty Services Required     Requested Specialty:  Dermatology     Number of Visits Requested:  1     No orders of the defined types were placed in this encounter.       Patient given educational materials - see patient instructions.  Discussed use, benefit, and side effects of prescribed medications.  All patient questions answered.  Pt voiced understanding. Reviewed health maintenance.  Instructed to continue current medications, diet and exercise.  Patient agreed with treatment plan. Follow up as directed.     Electronically signed by Alger Memos, MD on 12/16/2012 at 9:36 AM

## 2012-12-18 NOTE — Telephone Encounter (Signed)
Follow up ° ° °Pt returning your call °

## 2012-12-18 NOTE — Telephone Encounter (Signed)
LMTCB ./CY 

## 2012-12-19 NOTE — Telephone Encounter (Signed)
PT  AWARE OF CARDIAC CT RESULTS .Zack Seal

## 2012-12-19 NOTE — Telephone Encounter (Signed)
Follow Up ° °Pt returning call.  °

## 2013-01-06 ENCOUNTER — Ambulatory Visit (INDEPENDENT_AMBULATORY_CARE_PROVIDER_SITE_OTHER): Payer: 59 | Admitting: Emergency Medicine

## 2013-01-06 ENCOUNTER — Encounter: Payer: Self-pay | Admitting: Emergency Medicine

## 2013-01-06 VITALS — BP 130/84 | HR 70 | Temp 98.4°F | Resp 16 | Ht 71.5 in | Wt 325.0 lb

## 2013-01-06 DIAGNOSIS — E119 Type 2 diabetes mellitus without complications: Secondary | ICD-10-CM

## 2013-01-06 DIAGNOSIS — R739 Hyperglycemia, unspecified: Secondary | ICD-10-CM

## 2013-01-06 DIAGNOSIS — M25561 Pain in right knee: Secondary | ICD-10-CM

## 2013-01-06 DIAGNOSIS — I350 Nonrheumatic aortic (valve) stenosis: Secondary | ICD-10-CM

## 2013-01-06 DIAGNOSIS — I359 Nonrheumatic aortic valve disorder, unspecified: Secondary | ICD-10-CM

## 2013-01-06 DIAGNOSIS — M25569 Pain in unspecified knee: Secondary | ICD-10-CM

## 2013-01-06 DIAGNOSIS — R7309 Other abnormal glucose: Secondary | ICD-10-CM

## 2013-01-06 DIAGNOSIS — I10 Essential (primary) hypertension: Secondary | ICD-10-CM

## 2013-01-06 LAB — POCT GLYCOSYLATED HEMOGLOBIN (HGB A1C): Hemoglobin A1C: 5.5

## 2013-01-06 NOTE — Progress Notes (Signed)
  Subjective:    Patient ID: Malik [name], male    DOB: Feb 08, 1951, 62 y.o.   MRN: [id number]  HPI patient enters for followup of his diabetes. He has been doing well recently with regard to this. He recently had an echocardiogram done at the cardiologist and was found to have moderate aortic stenosis. A repeat echocardiogram has been suggested to be done in one year. While having his exercise stress test he injured his right knee and he has had persistent pain in the knee since that time. He [facility] his flu shot [facility] and overall except for his right knee pain he is doing well     Review of Systems     Objective:   Physical Exam   HEENT exam is unremarkable. His carotids are normal without bruits his chest is clear his cardiac exam reveals a 2/6 systolic murmur at the left sternal border. His abdomen is without tenderness. Extremities revealed 2+ pulses with a normal filament test. His right knee exam reveals degenerative changes. He does have normal flexion extension his McMurray's testing anterior drawer testing are all normal the Results for orders placed in visit on 01/06/13  GLUCOSE, POCT (MANUAL RESULT ENTRY)      Result Value Range   POC Glucose 104 (*) 70 - 99 mg/dl  POCT GLYCOSYLATED HEMOGLOBIN (HGB A1C)      Result Value Range   Hemoglobin A1C 5.5        Assessment & Plan:      His diabetes is doing well. I am concerned about his knee and he will let know if he continues to have problems. He has had flu shot. We'll recheck in 3 months. I did advise him to be sure he needs to have his echo done once year to follow his aortic stenosis.

## 2013-01-14 ENCOUNTER — Telehealth: Payer: Self-pay | Admitting: Cardiovascular Disease

## 2013-01-14 NOTE — Telephone Encounter (Signed)
PT  HAD QUESTIONS,  RE  BILL RECEIVED,  PT  STATES  HE OWES  $300.00 AND SOME  DOLLARS    WANTED  TO KNOW ABOUT    CODING  INFORMED PT   DX  WAS  STATED AS   MURMUR WHICH  SHOULD COVER  NOT SURE  WHAT TYPE OF COVERAGE  PT  HAS  PER PT  WILL STILL INVESTIGATE./CY

## 2013-01-14 NOTE — Telephone Encounter (Signed)
New problem   A procedure that he had ( echo ) he has questions about it. Please call patient before 5pm if possible

## 2013-01-15 ENCOUNTER — Telehealth: Payer: Self-pay | Admitting: Cardiovascular Disease

## 2013-01-15 NOTE — Telephone Encounter (Signed)
New message   Pt had an echo and treadmill test. He said it should have been coded as "preventive" because that is what he was told. He got a Optometrist

## 2013-01-15 NOTE — Telephone Encounter (Signed)
Reviewed with Charmaine in Precert/Billing dept and pt should call number at bottom of bill.  If unable to resolve after calling this number he should call our office back and ask to speak with Danielle Dess in the billing dept. I spoke with pt and gave him this information. He reports he has called the number on bill several times already and issue not resolved.  I told pt I would forward to [place] in billing to contact pt.  Will also forward to Baron Sane, business Furniture conservator/restorer.

## 2013-02-23 ENCOUNTER — Inpatient Hospital Stay
Admit: 2013-02-23 | Disposition: A | Discharge status: Home / Self Care | Payer: BLUE CROSS/BLUE SHIELD | Admitting: Internal Medicine

## 2013-02-23 LAB — MICROSCOPIC URINALYSIS
Epithelial Cells UA: 0 /HPF
RBC, UA: 5 /HPF (ref 0–2)
WBC, UA: 0 /HPF (ref 0–5)

## 2013-02-23 LAB — BASIC METABOLIC PANEL
Anion Gap: 11 mmol/L
BUN: 15 mg/dL (ref 8–23)
Bun/Cre Ratio: 20 (ref 9–20)
CO2: 32 mmol/L — ABNORMAL HIGH (ref 20–31)
Calcium: 8.6 mg/dL (ref 8.6–10.4)
Chloride: 97 mmol/L — ABNORMAL LOW (ref 98–107)
Creatinine: 0.76 mg/dL (ref 0.70–1.20)
GFR African American: >60 mL/min (ref >60)
GFR Non-African American: >60 mL/min (ref >60)
Glucose: 150 mg/dL — ABNORMAL HIGH (ref 70–99)
Potassium: 4.1 mmol/L (ref 3.7–5.3)
Sodium: 136 mmol/L (ref 135–144)

## 2013-02-23 LAB — CBC WITH AUTO DIFFERENTIAL
Absolute Eos #: 0 10*3/uL (ref 0.0–0.4)
Absolute Lymph #: 0.84 10*3/uL — ABNORMAL LOW (ref 1.0–4.8)
Absolute Mono #: 0.82 10*3/uL — ABNORMAL HIGH (ref 0.2–0.8)
Basophils Absolute: 0.14 10*3/uL (ref 0.0–0.2)
Basophils: 1 % (ref 0–2)
Eosinophils %: 0 % — ABNORMAL LOW (ref 1–4)
Hematocrit: 47.2 % (ref 41–53)
Hemoglobin: 15.9 g/dL (ref 13.5–17.5)
Lymphocytes: 5 % — ABNORMAL LOW (ref 24–44)
MCH: 31.7 pg (ref 26–34)
MCHC: 33.7 g/dL (ref 31–37)
MCV: 94 fL (ref 80–100)
MPV: 7.5 fL (ref 6.0–12.0)
Monocytes: 5 % (ref 1–7)
Platelets: 293 10*3/uL (ref 130–400)
RBC: 5.02 m/uL (ref 4.5–5.9)
RDW: 13.9 % (ref 11.5–14.5)
Seg Neutrophils: 89 % — ABNORMAL HIGH (ref 36–66)
Segs Absolute: 14.71 10*3/uL — ABNORMAL HIGH (ref 1.8–7.7)
WBC: 16.5 10*3/uL — ABNORMAL HIGH (ref 3.5–11.0)

## 2013-02-23 LAB — HEPATIC FUNCTION PANEL
ALT: 19 U/L (ref 5–41)
AST: 21 U/L (ref <40)
Albumin: 4 g/dL (ref 3.5–5.2)
Alkaline Phosphatase: 62 U/L (ref 40–129)
Bilirubin, Direct: <0.08 mg/dL (ref <0.31)
Total Bilirubin: 0.43 mg/dL (ref 0.3–1.2)
Total Protein: 6.8 g/dL (ref 6.4–8.3)

## 2013-02-23 LAB — URINALYSIS
Bilirubin Urine: NEGATIVE
Ketones, Urine: NEGATIVE
Leukocyte Esterase, Urine: NEGATIVE
Nitrite, Urine: NEGATIVE
Protein, UA: NEGATIVE
Specific Gravity, UA: 1.025 (ref 1.005–1.030)
Urobilinogen, Urine: NORMAL
pH, UA: 6.5 (ref 5.0–8.0)

## 2013-02-23 LAB — LIPASE: Lipase: 115 U/L — ABNORMAL HIGH (ref 13–60)

## 2013-02-23 LAB — AMYLASE: Amylase: 98 U/L (ref 28–100)

## 2013-02-23 MED ORDER — HYDROMORPHONE HCL PF 1 MG/ML IJ SOLN
1 | INTRAMUSCULAR | Status: DC | PRN
Start: 2013-02-23 — End: 2013-02-25

## 2013-02-23 MED ADMIN — morphine (PF) injection 2 mg: INTRAVENOUS | @ 16:00:00 | NDC 00409189001

## 2013-02-23 MED ADMIN — HYDROmorphone (DILAUDID) injection 2 mg: INTRAVENOUS | @ 21:00:00 | NDC 00409131230

## 2013-02-23 MED ADMIN — ondansetron (ZOFRAN) injection 4 mg: INTRAVENOUS | @ 16:00:00 | NDC 23155037831

## 2013-02-23 MED ADMIN — ioversol (OPTIRAY) 74 % injection 125 mL: INTRAVENOUS | @ 18:00:00 | NDC 00019133387

## 2013-02-23 MED ADMIN — pantoprazole (PROTONIX) injection 40 mg: INTRAVENOUS | @ 21:00:00 | NDC 00008400101

## 2013-02-23 MED ADMIN — sodium chloride (PF) 0.9 % injection 10 mL: INTRAVENOUS | @ 21:00:00 | NDC 00409488810

## 2013-02-23 MED ADMIN — 0.9 % sodium chloride infusion: INTRAVENOUS | @ 21:00:00 | NDC 00338004904

## 2013-02-23 MED ADMIN — 0.9 % sodium chloride bolus: INTRAVENOUS | @ 16:00:00 | NDC 00338004904

## 2013-02-23 MED FILL — ONDANSETRON HCL 4 MG/2ML IJ SOLN: 4 MG/2ML | INTRAMUSCULAR | Qty: 2

## 2013-02-23 MED FILL — PROTONIX 40 MG IV SOLR: 40 MG | INTRAVENOUS | Qty: 40

## 2013-02-23 MED FILL — HYDROMORPHONE HCL 2 MG/ML IJ SOLN: 2 MG/ML | INTRAMUSCULAR | Qty: 1

## 2013-02-23 MED FILL — MORPHINE SULFATE (PF) 2 MG/ML IV SOLN: 2 mg/mL | INTRAVENOUS | Qty: 1

## 2013-02-23 MED FILL — HYDROMORPHONE HCL PF 1 MG/ML IJ SOLN: 1 MG/ML | INTRAMUSCULAR | Qty: 1

## 2013-02-23 NOTE — ED Notes (Signed)
Pt sts decrease in nausea and pain     Lavonda JumboKim M Kaleb Sek, RN  02/23/13 1257

## 2013-02-23 NOTE — ED Notes (Signed)
C/o pain to lower abd with vomiting started yesterday.denies diarrhea or fever.denies urinary symptoms.abd soft.sts pain constant and feels better to lay on side.small bm this am.color fair skin warm et dry.    Lavonda JumboKim M Tiandra Swoveland, RN  02/23/13 1140

## 2013-02-23 NOTE — ED Notes (Signed)
Returned from ct    Lavonda JumboKim M Magin Balbi, RN  02/23/13 1256

## 2013-02-23 NOTE — Other (Signed)
02/23/13 1900   Encounter Summary   Services provided to: Patient and family together   Referral/Consult From: Rounding   Support System Family members   Continue Visiting (02-23-13)   Complexity of Encounter Low   Length of Encounter 15 minutes   Routine   Type Initial   Assessment Calm;Approachable   Intervention Explored feelings, thoughts, concerns   Outcome Refused/declined

## 2013-02-23 NOTE — Other (Signed)
Abdominal pain states has no ride home, so dilaudid at this time held until [place] NP has talked to pt and decided on plan of care.

## 2013-02-23 NOTE — Other (Signed)
Pt admitted to room 2008-1 from the ED.  Pt oriented to room and surroundings.   Reviewed doctors orders. Call light with in reach. No distress noted.

## 2013-02-23 NOTE — ED Provider Notes (Signed)
[facility]  eMERGENCY dEPARTMENT eNCOUnter      Pt Name: Dustin Zimmerman  MRN: [id number]  Birthdate 01/04/1951  Date of evaluation: 02/23/2013  Provider: Bufford Spikes, MD    CHIEF COMPLAINT       Chief Complaint   Patient presents with   ??? Abdominal Pain         HISTORY OF PRESENT ILLNESS  (Location/Symptom, Timing/Onset, Context/Setting, Quality, Duration, Modifying Factors, Severity.)   Dustin Zimmerman is a 62 y.o. male who presents to the emergency department sense with history of abdominal pain which she describes as a 10 on a scale of 1-10 left upper quadrant exacerbated by movement or cough.  Nausea without vomiting feels constipated denies any diarrhea .  Patient was seen with [facility] nurse practitioner  Nursing Notes were reviewed.    ALLERGIES     Motrin and Percocet    CURRENT MEDICATIONS       Discharge Medication List as of 02/25/2013  1:47 PM      CONTINUE these medications which have NOT CHANGED    Details   Levothyroxine Sodium (SYNTHROID PO) Take  by mouth daily.             PAST MEDICAL HISTORY         Diagnosis Date   ??? Hypothyroid        SURGICAL HISTORY           Procedure Laterality Date   ??? Eye surgery           FAMILY HISTORY     History reviewed. No pertinent family history.  No family status information on file.        SOCIAL HISTORY      reports that he has never smoked. He does not have any smokeless tobacco history on file. He reports that he drinks alcohol.    REVIEW OF SYSTEMS    (2-9 systems for level 4, 10 or more for level 5)     Review of Systems   Constitutional: Positive for activity change, appetite change and fatigue.   HENT: Negative.    Eyes: Negative.    Respiratory: Positive for cough and shortness of breath. Negative for choking, wheezing and stridor.    Cardiovascular: Negative for chest pain, palpitations and leg swelling.   Gastrointestinal: Positive for nausea, abdominal pain, constipation and abdominal distention. Negative for diarrhea, blood in stool and anal  bleeding.   Endocrine: Negative.    Genitourinary: Negative for dysuria, frequency, hematuria, flank pain and enuresis.   Musculoskeletal: Positive for back pain. Negative for myalgias, joint swelling, arthralgias and gait problem.   Skin: Negative.    Allergic/Immunologic: Negative.    Neurological: Negative.    Hematological: Negative.    Psychiatric/Behavioral: Negative.         Except as noted above the remainder of the review of systems was reviewed and negative.     PHYSICAL EXAM    (up to 7 for level 4, 8 or more for level 5)   ED Triage Vitals   BP Temp Temp Source Pulse Resp SpO2 Height Weight   02/23/13 1019 02/23/13 1019 02/23/13 1019 02/23/13 1019 02/23/13 1019 02/23/13 1019 02/23/13 1019 02/23/13 1019   143/81 mmHg 97.5 ??F (36.4 ??C) Oral 91 16 97 % 6\' 1"  (1.854 m) 200 lb 6.4 oz (90.9 kg)       By exam was a secondary exam after medication which his spleen had improved considerably there was  no evidence of acute surgical abdomen.      DIAGNOSTIC RESULTS     EKG: All EKG's are interpreted by the Emergency Department Physician who either signs or Co-signs this chart in the absence of a cardiologist.        RADIOLOGY:   Non-plain film images such as CT, Ultrasound and MRI are read by the radiologist. Plain radiographic images are visualized and preliminarily interpreted by the emergency physician with the below findings:    Changes consistent with pancreatitis are noted there is also pericolonic gutter fluid and dilatation of the common bile duct    Interpretation per the Radiologist below, if available at the time of this note:    CT ABDOMEN PELVIS W IV CONTRAST    Final Result:        CT ABDOMEN PELVIS W IV CONTRAST    (Canceled)         ED BEDSIDE ULTRASOUND:   Performed by ED Physician - none    LABS:  Labs Reviewed   CBC WITH AUTO DIFFERENTIAL - Abnormal; Notable for the following:     WBC 16.5 (*)     Seg Neutrophils 89 (*)     Lymphocytes 5 (*)     Eosinophils Relative Percent 0 (*)     Segs Absolute  14.71 (*)     Absolute Lymph # 0.84 (*)     Absolute Mono # 0.82 (*)     All other components within normal limits   BASIC METABOLIC PANEL - Abnormal; Notable for the following:     Glucose 150 (*)     Chloride 97 (*)     CO2 32 (*)     All other components within normal limits   URINALYSIS - Abnormal; Notable for the following:     Turbidity UA SLIGHTLY CLOUDY (*)     Glucose, Ur TRACE (*)     Urine Hgb TRACE (*)     All other components within normal limits   MICROSCOPIC URINALYSIS - Abnormal; Notable for the following:     Mucus, UA 2+ (*)     All other components within normal limits   LIPASE - Abnormal; Notable for the following:     Lipase 115 (*)     All other components within normal limits   COMP METABOLIC W/ BILI PROFILE - Abnormal; Notable for the following:     Alb 3.1 (*)     Calcium 7.9 (*)     Glucose 119 (*)     Total Protein 5.4 (*)     All other components within normal limits   COMPREHENSIVE METABOLIC PANEL - Abnormal; Notable for the following:     Glucose 136 (*)     Calcium 8.0 (*)     Total Protein 5.3 (*)     Alb 2.9 (*)     All other components within normal limits   AMYLASE   HEPATIC FUNCTION PANEL   LIPASE   AMYLASE   LIPID PANEL   HEMOGLOBIN A1C   LIPASE       All other labs were within normal range or not returned as of this dictation.    EMERGENCY DEPARTMENT COURSE and DIFFERENTIAL DIAGNOSIS/MDM:   Vitals:    Filed Vitals:    02/23/13 1019 02/24/13 0800 02/24/13 2000 02/25/13 0700   BP:  96/50 102/61 103/86   Pulse:  90 84 86   Temp:  98.2 ??F (36.8 ??C) 99.1 ??F (37.3 ??C) 97.7 ??F (36.5 ??C)  TempSrc:  Oral Oral Oral   Resp:  16 16 20    Height: 6\' 1"  (1.854 m)      Weight: 200 lb 6.4 oz (90.9 kg)      SpO2:  94% 94% 96%     Patient will be admitted to the hospital for further evaluation and treatment was observed in the emergency department for over 9 hours was admitted to the hospital    CONSULTS:  IP CONSULT TO HOSPITALIST  IP CONSULT TO GI    PROCEDURES:  Not clinically  indicated.      FINAL IMPRESSION    No diagnosis found.      DISPOSITION/PLAN   DISPOSITION Admitted    PATIENT REFERRED TO:   Alger Memos, MD  [address]  [place] [address]  [phone]            DISCHARGE MEDICATIONS:     Discharge Medication List as of 02/25/2013  1:47 PM              (Please note that portions of this note were completed with a voice recognition program.  Efforts were made to edit the dictations but occasionally words are mis-transcribed.)    Bufford Spikes, MD  Attending Emergency Physician          Bufford Spikes, MD  02/27/13 [phone]

## 2013-02-23 NOTE — ED Provider Notes (Signed)
eMERGENCY dEPARTMENT eNCOUnter      CHIEF COMPLAINT    Chief Complaint   Patient presents with   ??? Abdominal Pain       HPI    Dustin Zimmerman is a 62 y.o. male who presents With complaints of lower abdominal pain that started yesterday.  He complains of low abdominal pain over the bladder and in the left and right lower quadrants that he rates a 9 and describes as bloating and cramping.  He admits to nausea and vomiting since this pain started yesterday as well.  No diarrhea or constipation.  His last bowel movement was this morning.  No recent fevers or illnesses.  The pain does not radiate to the groin or back.  No dysuria or hematuria.  No chest pain or shortness of breath.  He does get some relief when laying on his side.  The abdominal pain is worse when laying flat on his back.    REVIEW OF SYSTEMS    See HPI for further details. Review of systems otherwise negative.     PAST MEDICAL HISTORY    Past Medical History   Diagnosis Date   ??? Hypothyroid        SURGICAL HISTORY    Past Surgical History   Procedure Laterality Date   ??? Eye surgery         CURRENT MEDICATIONS    Current Outpatient Rx   Name  Route  Sig  Dispense  Refill   ??? Levothyroxine Sodium (SYNTHROID PO)    Oral    Take  by mouth.                 ALLERGIES    Allergies   Allergen Reactions   ??? Motrin [Ibuprofen]    ??? Percocet [Oxycodone-Acetaminophen]        FAMILY HISTORY    History reviewed. No pertinent family history.    SOCIAL HISTORY    History     Social History   ??? Marital Status: Single     Spouse Name: N/A     Number of Children: N/A   ??? Years of Education: N/A     Social History Main Topics   ??? Smoking status: Never Smoker    ??? Smokeless tobacco: None   ??? Alcohol Use: Yes      Comment: occ   ??? Drug Use: None   ??? Sexual Activity: None     Other Topics Concern   ??? None     Social History Narrative       PHYSICAL EXAM    VITAL SIGNS: BP 143/81   Pulse 91   Temp(Src) 97.5 ??F (36.4 ??C) (Oral)   Resp 16   Ht 6\' 1"  (1.854 m)   Wt 200 lb 6.4  oz (90.9 kg)   BMI 26.45 kg/m2   SpO2 97%  Constitutional:  Well developed, well nourished, no acute distress, non-toxic appearance   Eyes:  conjunctiva normal   HENT:  Atraumatic, external ears normal, nose normal, oropharynx moist. Neck supple   Respiratory:  No respiratory distress, normal breath sounds   Cardiovascular:  Normal rate, normal rhythm  GI:  Abdomen is soft and slightly distended.  He is tender with palpation of the right and left lower quadrants as well as over the bladder.  No masses could be appreciated.   GU:  No CVA tenderness   Musculoskeletal:  No edema   Integument:  Well hydrated     RADIOLOGY/PROCEDURES  Ct Abdomen Pelvis W Iv Contrast  02/23/2013   ** FINAL ** Procedure:  ACT  Feb 23 2013 12:53PM   [id number] CT ABD AND PELVIS WITH  CONTRAST  Reason for Exam:  ^Pain IMPRESSION:     1. There are inflammatory changes around the pancreas with associated  free fluid in the peripancreatic region as well as the right paracolic  gutter. Findings are consistent with pancreatitis. No focal fluid  collection is present to suggest an abscess or pseudocyst. 2. Common duct measures 6-7 mm but no intrahepatic biliary ductal  dilatation is identified. 3. Small left pleural effusion  Report Electronically signed by Tillie Rung, M.D. on 02/23/2013 1:13 PM Transcribed by: [facility] on Feb 23 2013  1:15P  Read by:  Tillie Rung, M.D.  [phone] on Feb 23 2013  1:15P  Electronically Signed by:  DR. Tillie Rung, M.D. on:  Feb 23 2013   1:15P                                                                                  ED COURSE & MEDICAL DECISION MAKING    Pertinent Labs & Imaging studies reviewed. (See chart for details)   This is a pleasant 62 year old male who is not in any acute distress. IV access was obtained. He was hydrated with normal saline and medicated for pain. Urinalysis shows trace hemoglobin but no signs of urinary tract infection.  Blood work was obtained and his white count is elevated at  16.5. Amylase is normal and lipase is elevated at 115.  He has normal liver function panel.  CT of the abdomen demonstrates pancreatitis as above.  He continued to have abdominal pain and it was decided that he would be admitted for further observation.  He was agreeable to this admission.    FINAL IMPRESSION    1.  Abdominal pain  2.  pancreatitis      Julaine Fusi Jaxson Keener, NP  02/23/13 1604

## 2013-02-23 NOTE — ED Notes (Signed)
Informed unit will be up at 1800 hrs for report and transfer of care of pt.     Casimiro Needle, RN  02/23/13 1750

## 2013-02-23 NOTE — ED Notes (Signed)
Taken for ct    Lavonda JumboKim M Azel Gumina, RN  02/23/13 1256

## 2013-02-24 LAB — COMPREHENSIVE METABOLIC PANEL WITH BILIRUBIN
ALT: 13 U/L (ref 5–41)
AST: 14 U/L (ref <40)
Albumin: 3.1 g/dL — ABNORMAL LOW (ref 3.5–5.2)
Alkaline Phosphatase: 46 U/L (ref 40–129)
Anion Gap: 11 mmol/L
BUN: 13 mg/dL (ref 8–23)
Bilirubin, Direct: <0.08 mg/dL (ref <0.31)
CO2: 30 mmol/L (ref 20–31)
Calcium: 7.9 mg/dL — ABNORMAL LOW (ref 8.6–10.4)
Chloride: 103 mmol/L (ref 98–107)
Creatinine: 0.86 mg/dL (ref 0.70–1.20)
GFR African American: >60 mL/min (ref >60)
GFR Non-African American: >60 mL/min (ref >60)
Glucose: 119 mg/dL — ABNORMAL HIGH (ref 70–99)
Potassium: 4.4 mmol/L (ref 3.7–5.3)
Sodium: 140 mmol/L (ref 135–144)
Total Bilirubin: 0.43 mg/dL (ref 0.3–1.2)
Total Protein: 5.4 g/dL — ABNORMAL LOW (ref 6.4–8.3)

## 2013-02-24 LAB — LIPID PANEL
Chol/HDL Ratio: 2.8 (ref <5)
Cholesterol: 125 mg/dL (ref <200)
HDL: 45 mg/dL (ref >40)
LDL Cholesterol: 66 mg/dL (ref 0–130)
Triglycerides: 70 mg/dL (ref <150)

## 2013-02-24 LAB — AMYLASE: Amylase: 57 U/L (ref 28–100)

## 2013-02-24 LAB — LIPASE: Lipase: 48 U/L (ref 13–60)

## 2013-02-24 MED ADMIN — 0.9 % sodium chloride infusion: INTRAVENOUS | @ 14:00:00 | NDC 00338004904

## 2013-02-24 MED ADMIN — pantoprazole (PROTONIX) injection 40 mg: INTRAVENOUS | @ 14:00:00 | NDC 00008400101

## 2013-02-24 MED ADMIN — 0.9 % sodium chloride infusion: INTRAVENOUS | @ 04:00:00 | NDC 00338004904

## 2013-02-24 MED ADMIN — sodium chloride (PF) 0.9 % injection 10 mL: INTRAVENOUS | @ 14:00:00 | NDC 00409488810

## 2013-02-24 MED FILL — PROTONIX 40 MG IV SOLR: 40 MG | INTRAVENOUS | Qty: 40

## 2013-02-24 NOTE — Consults (Signed)
Gastroenterology Consult Note      Patient: Dustin Zimmerman  DOB: 09/16/1950  Acct#:  [id number]     Date:  02/24/2013    Subjective:       History of Present Illness  Patient is a 62 y.o. Caucasian male admitted with ABDOMINAL PAININPT who is seen in consult for pancreatitis   Pt came with lower abdominal pain that started 2 days ago, He complains of low abdominal pain over the bladder and in the left and right lower quadrants that he rates a 9 and describes as bloating and cramping. He admits to nausea and vomiting since this pain started yesterday as well. No diarrhea or constipation. His last bowel movement was this morning. No recent fevers or illnesses. The pain does not radiate to the groin or back. No dysuria or hematuria. No chest pain or shortness of breath. He does get some relief when laying on his side. The abdominal pain is worse when laying flat on his back.  Ct showed severe pancreatitis signs, labs showed mild elevation of Amy/lipase which has improved since yesterday, LFTs were normal  Calcium, TG are normal and pt denied any significant ETOH use, no new medication also.    Past Medical History   Diagnosis Date   ??? Hypothyroid       Past Surgical History   Procedure Laterality Date   ??? Eye surgery        Past Endoscopic History egd, multiple colonoscopies     Admission Meds  No current facility-administered medications on file prior to encounter.     No current outpatient prescriptions on file prior to encounter.       Patient   Does Use ASA, NSAID No  Allergies  Allergies   Allergen Reactions   ??? Motrin [Ibuprofen]    ??? Percocet [Oxycodone-Acetaminophen]         Social   History   Substance Use Topics   ??? Smoking status: Never Smoker    ??? Smokeless tobacco: Not on file   ??? Alcohol Use: Yes      Comment: occ        PSYCH HISTORY:  Depression No  Anxiety No  Suicide No       History reviewed. No pertinent family history.   No family history of colon cancer, Crohn's disease, or ulcerative  colitis.     Review of Systems  Constitutional: negative  Eyes: negative  Ears, nose, mouth, throat, and face: negative  Respiratory: negative  Cardiovascular: negative  Gastrointestinal: negative  Genitourinary:negative  Integument/breast: negative  Hematologic/lymphatic: negative  Musculoskeletal:negative  Endocrine: negative           Physical Exam  Blood pressure 96/50, pulse 90, temperature 98.2 ??F (36.8 ??C), temperature source Oral, resp. rate 16, height 6\' 1"  (1.854 m), weight 200 lb 6.4 oz (90.9 kg), SpO2 94 %.         General Appearance: alert and oriented to person, place and time, well-developed and well-nourished, in no acute distress  Skin: warm and dry, no rash or erythema  Head: normocephalic and atraumatic  Eyes: pupils equal, round, and reactive to light, extraocular eye movements intact, conjunctivae normal  ENT: hearing grossly normal bilaterally  Neck: neck supple and non tender without mass, no thyromegaly or thyroid nodules, no cervical lymphadenopathy   Pulmonary/Chest: clear to auscultation bilaterally- no wheezes, rales or rhonchi, normal air movement, no respiratory distress  Cardiovascular: normal rate, regular rhythm, normal S1 and S2, no murmurs, rubs,  clicks or gallops, distal pulses intact, no carotid bruits  Abdomen: soft, non-tender, non-distended, normal bowel sounds, no masses or organomegaly  Extremities: no cyanosis, clubbing or edema  Musculoskeletal: normal range of motion, no joint swelling, deformity or tenderness  Neurologic: no cranial nerve deficit and muscle strength normal    Data Review:    Recent Labs      02/23/13   1116   WBC  16.5*   HGB  15.9   HCT  47.2   MCV  94.0   PLT  293     Recent Labs      02/23/13   1116  02/24/13   0546   NA  136  140   K  4.1  4.4   CL  97*  103   CO2  32*  30   BUN  15  13   CREATININE  0.76  0.86     Recent Labs      02/23/13   1116  02/24/13   0546   AST  21  14   ALT  19  13   BILIDIR  <0.08  <0.08   BILITOT  0.43  0.43   ALKPHOS  62   46     Recent Labs      02/23/13   1116  02/24/13   0546   LIPASE  115*  48   AMYLASE  98  57     No results found for this basename: PROTIME, INR,  in the last 72 hours  No results found for this basename: PTT,  in the last 72 hours  No results found for this basename: OCCULTBLD,  in the last 72 hours  CEA:    No results found for this basename: CEA     Ca 125:    No results found for this basename: CA125     Ca 19-9:    No results found for this basename: CA199     Ca 15-3:    No results found for this basename: CA153     AFP:    No components found with this basename: AFAFP     Beta HCG:    No components found with this basename: BHCG     Neuron Specific Enolase:    No results found for this basename: NSE     Imaging Studies:                           All appropriate imaging studies and reports reviewed: Yes     IMPRESSION:    1. There are inflammatory changes around the pancreas with associated    free fluid in the peripancreatic region as well as the right paracolic    gutter. Findings are consistent with pancreatitis. No focal fluid    collection is present to suggest an abscess or pseudocyst.   2. Common duct measures 6-7 mm but no intrahepatic biliary ductal    dilatation is identified.   3. Small left pleural effusion      Report Electronically signed by Tillie Rung, M.D. on 02/23/2013 1:13 PM   Transcribed by: [facility] on Feb 23 2013 1:15P    Read by: Tillie Rung, M.D. [phone] on Feb 23 2013 1:15P    Electronically Signed by: DR. Tillie Rung, M.D. on: Feb 23 2013    1:15P                   Assessment:  Principal Problem:    Acute pancreatitis (HCC)  Active Problems:    Hypothyroid    Hyperglycemia    Although the abd pain is not typical for pancreatitis, but the levation in Amy/lipase and the significant CT findings are diagnostic    Recommendations:   PPI  IVF  Tumor markers  Daily labs  Clear liquid diet.      Thank you for allowing me to participate in the care of your patient.  Please feel free to  contact me with any questions or concerns.     Marcia Brash, MD

## 2013-02-24 NOTE — H&P (Signed)
Internal Medicine History and Physical      Name: Dustin Zimmerman  MRN: [id number]     Acct: [id number]  Room: [id number]    Admit Date: 02/23/2013  PCP: Alger Memos, MD    Chief Complaint:     Chief Complaint   Patient presents with   ??? Abdominal Pain       History Obtained From:     patient, electronic medical record    History of Present Illness:      Dustin Zimmerman is a Caucasian 62 y.o.  male who presents with Abdominal Pain      Dustin Zimmerman is a 62 y.o. male who presents With complaints of lower abdominal pain that started yesterday. He complains of low abdominal pain over the bladder and in the left and right lower quadrants that he rates a 9 and describes as bloating and cramping. He admits to nausea and vomiting since this pain started yesterday as well. No diarrhea or constipation. His last bowel movement was this morning. No recent fevers or illnesses. The pain does not radiate to the groin or back.  He denies any CP or SOB.    He initially thought that I evaluated him in the ED yesterday, which I did not.    Past Medical History:     Past Medical History   Diagnosis Date   ??? Hypothyroid         Past Surgical History:     Past Surgical History   Procedure Laterality Date   ??? Eye surgery          Medications Prior to Admission:       Prior to Admission medications    Medication Sig Start Date End Date Taking? Authorizing Provider   Levothyroxine Sodium (SYNTHROID PO) Take  by mouth daily.   Yes Historical Provider, MD        Allergies:       Motrin and Percocet    Social History:     Tobacco:    reports that he has never smoked. He does not have any smokeless tobacco history on file.  Alcohol:      reports that he drinks alcohol.  Drug Use:  has no drug history on file.    Family History:     History reviewed. No pertinent family history.    Review of Systems:     Positive and Negative as described in HPI    Constitutional:  negative for fevers, chills, sweats, fatigue, weight loss  HEENT:   negative for vision, hearing changes  Respiratory:  negative for shortness of breath, cough, congestion  Cardiovascular:  negative for chest pain, palpitations  Gastrointestinal:  negative for nausea, vomiting, diarrhea, constipation, abdominal pain  Genitourinary:  negative for frequency, dysuria  Integument/Breast:  negative for rash, skin lesions  Musculoskeletal:  negative for muscle aches or joint pain  Neurological:  negative for headaches, dizziness, lightheadedness, numbness, pain and tingling extrimities  Behavior/Psych:  negative for depression and anxiety    Code Status:  No Order    Physical Exam:     Vitals:  BP 96/50   Pulse 90   Temp(Src) 98.2 ??F (36.8 ??C) (Oral)   Resp 16   Ht 6\' 1"  (1.854 m)   Wt 200 lb 6.4 oz (90.9 kg)   BMI 26.45 kg/m2   SpO2 94%  Temp (24hrs), Avg:98.3 ??F (36.8 ??C), Min:97.5 ??F (36.4 ??C), Max:99.4 ??F (37.4 ??C)      General  appearance - alert, well appearing, and in no acute distress  Mental status - oriented to person, place, and time with normal affect  Head - normocephalic and atraumatic  Eyes - pupils equal and reactive, extraocular eye movements intact, conjunctiva clear  Ears - hearing appears to be intact  Nose - no drainage noted  Mouth - mucous membranes moist  Neck - supple, no carotid bruits, thyroid not palpable  Chest - clear to auscultation, normal effort  Heart - normal rate, regular rhythm, no murmur  Abdomen - soft, nontender, nondistended, bowel sounds present all four quadrants, no masses, hepatomegaly or splenomegaly  Neurological - normal speech, no focal findings or movement disorder noted, cranial nerves II through XII grossly intact  Extremities - peripheral pulses palpable, no pedal edema or calf pain with palpation  Skin - no gross lesions, rashes, or induration noted        Data:     Hematology:  Recent Labs      02/23/13   1116   WBC  16.5*   HGB  15.9   HCT  47.2   PLT  293     Chemistry:  Recent Labs      02/23/13   1116  02/24/13   0546   NA  136   140   K  4.1  4.4   CL  97*  103   CO2  32*  30   GLUCOSE  150*  119*   BUN  15  13   CREATININE  0.76  0.86   CALCIUM  8.6  7.9*     Recent Labs      02/23/13   1116  02/24/13   0546   PROT  6.8  5.4*   LABALBU  4.0  3.1*   AST  21  14   ALT  19  13   ALKPHOS  62  46   BILITOT  0.43  0.43   BILIDIR  <0.08  <0.08   AMYLASE  98  57   LIPASE  115*  48   CHOL   --   125   TRIG   --   70   HDL   --   45     CT abd:    IMPRESSION:   1. There are inflammatory changes around the pancreas with associated   free fluid in the peripancreatic region as well as the right paracolic   gutter. Findings are consistent with pancreatitis. No focal fluid   collection is present to suggest an abscess or pseudocyst.  2. Common duct measures 6-7 mm but no intrahepatic biliary ductal   dilatation is identified.  3. Small left pleural effusion      Assesment:     Primary Problem  Acute pancreatitis [facility])    Active Hospital Problems    Diagnosis Date Noted   ??? Acute pancreatitis (HCC) [577.0] 02/24/2013   ??? Hypothyroid [244.9] 02/24/2013   ??? Hyperglycemia [790.29] 02/24/2013       Plan:     1. Admit  2. IV fluids, lipase normal now  3. GI consult pending, ? GB US or MRCP?  Triglycerides normal  4. Pain control  5. Hopefully dc planning soon      Electronically signed by Janeece Agee L. Doylene Canard, MD on 02/24/2013 at 3:59 PM     Copy sent to Dr. Alger Memos, MD

## 2013-02-24 NOTE — Plan of Care (Signed)
Problem: Falls - Risk of  Goal: Absence of falls  Outcome: Ongoing  Patient remains ambulatory, free from falls and injuries, fall risk assessment completed per shift and prn. Bed in lowest position with wheels locked, call light at reach and utilized appropriately, frequently used items within reach, low fall risk per fall risk assessment,hourly rounding implemented. Will continue to monitor and educate on safety as needed.

## 2013-02-24 NOTE — Plan of Care (Signed)
Problem: Pain  Goal: Control of acute pain  Outcome: Ongoing  Patient resting in bed. No s/s of distress noted. Patient rates abdomen pain a 5 on 0-10 pain scale. Patient state pain level is tolerable. Patient refused pain medication during shift. Call light and belongings in reach. Will continue to monitor.

## 2013-02-24 NOTE — Other (Signed)
Patient states that he is unable to receive flu/pneumonia vaccines d/t recent cornea implants and this would cause a rejection

## 2013-02-24 NOTE — Care Coordination-Inpatient (Signed)
Per ED DC assessment, no needs identified @ this time.  Will follow.

## 2013-02-25 LAB — COMPREHENSIVE METABOLIC PANEL
ALT: 16 U/L (ref 5–41)
AST: 18 U/L (ref <40)
Albumin: 2.9 g/dL — ABNORMAL LOW (ref 3.5–5.2)
Alkaline Phosphatase: 49 U/L (ref 40–129)
Anion Gap: 11 mmol/L
BUN: 9 mg/dL (ref 8–23)
Bun/Cre Ratio: 12 (ref 9–20)
CO2: 30 mmol/L (ref 20–31)
Calcium: 8 mg/dL — ABNORMAL LOW (ref 8.6–10.4)
Chloride: 104 mmol/L (ref 98–107)
Creatinine: 0.78 mg/dL (ref 0.70–1.20)
GFR African American: >60 mL/min (ref >60)
GFR Non-African American: >60 mL/min (ref >60)
Glucose: 136 mg/dL — ABNORMAL HIGH (ref 70–99)
Potassium: 4 mmol/L (ref 3.7–5.3)
Sodium: 141 mmol/L (ref 135–144)
Total Bilirubin: 0.32 mg/dL (ref 0.3–1.2)
Total Protein: 5.3 g/dL — ABNORMAL LOW (ref 6.4–8.3)

## 2013-02-25 LAB — HEMOGLOBIN A1C
Estimated Avg Glucose: 123 mg/dL
Hemoglobin A1C: 5.9 % (ref 4.0–6.0)

## 2013-02-25 LAB — LIPASE: Lipase: 33 U/L (ref 13–60)

## 2013-02-25 MED ADMIN — 0.9 % sodium chloride infusion: INTRAVENOUS | @ 06:00:00 | NDC 00338004903

## 2013-02-25 MED ADMIN — 0.9 % sodium chloride infusion: INTRAVENOUS | NDC 00338004904

## 2013-02-25 MED ADMIN — sodium chloride (PF) 0.9 % injection 10 mL: INTRAVENOUS | @ 14:00:00 | NDC 00409488810

## 2013-02-25 MED ADMIN — pantoprazole (PROTONIX) injection 40 mg: INTRAVENOUS | @ 14:00:00 | NDC 00008400101

## 2013-02-25 MED ADMIN — 0.9 % sodium chloride infusion: INTRAVENOUS | @ 08:00:00 | NDC 00338004903

## 2013-02-25 MED ADMIN — 0.9 % sodium chloride infusion: INTRAVENOUS | @ 12:00:00 | NDC 00338004903

## 2013-02-25 MED FILL — PROTONIX 40 MG IV SOLR: 40 MG | INTRAVENOUS | Qty: 40

## 2013-02-25 NOTE — Progress Notes (Signed)
Pt discharged to home with friend. DC instructions given.

## 2013-02-25 NOTE — Plan of Care (Signed)
Problem: Falls - Risk of  Goal: Absence of falls  Outcome: Ongoing

## 2013-02-25 NOTE — Plan of Care (Signed)
Problem: Falls - Risk of  Goal: Absence of falls  Outcome: Ongoing  Patient is a fall risk during this admission. Fall risk assessment was performed. Patient is absent of falls. Bed is in the lowest position. Wheels on the bed are locked. Call light and bed side table are within reach. Clutter is removed. Patient was educated to call out when needing assistance or wanting to get out of bed. Patient offered toileting assistance during rounding. Hourly rounds have been performed.

## 2013-02-25 NOTE — Plan of Care (Signed)
Problem: Pain  Goal: Control of acute pain  Outcome: Ongoing  Patient dangled and stood at bedside then got back in bed. Patient denies abdomen pain. Patient state "feeling hungry" and ate ice cream and 2 jello. No s/s of distress noted. Call light and belongings in reach. Will continue to monitor.

## 2013-02-25 NOTE — Discharge Instructions (Signed)
Pancreatitis: After Your Visit  Your Care Instructions     The pancreas is an organ behind the stomach. It makes hormones and enzymes to help your body digest food.  But if these enzymes attack the pancreas, it can get inflamed. This is called pancreatitis. Most cases are caused by gallstones or by heavy alcohol use.  If you take care of yourself at home, it will help you get better. It will also help you avoid more problems with your pancreas.  Follow-up care is a key part of your treatment and safety. Be sure to make and go to all appointments, and call your doctor if you are having problems. It's also a good idea to know your test results and keep a list of the medicines you take.  How can you care for yourself at home?   Drink clear liquids and eat bland foods until you feel better. Bland foods include rice, dry toast, and crackers. They also include bananas and applesauce.   Eat a low-fat diet until your doctor says your pancreas is healed.   Do not drink alcohol. Tell your doctor if you need help to quit. Counseling, support groups, and sometimes medicines can help you stay sober.   Be safe with medicines. Read and follow all instructions on the label.   If the doctor gave you a prescription medicine for pain, take it as prescribed.   If you are not taking a prescription pain medicine, ask your doctor if you can take an over-the-counter medicine.   If your doctor prescribed antibiotics, take them as directed. Do not stop taking them just because you feel better. You need to take the full course of antibiotics.   Get extra rest until you feel better.  To prevent future problems with your pancreas   Do not drink alcohol.   Tell your doctors and pharmacist that you've had pancreatitis. They can help you avoid medicines that may cause this problem again.  When should you call for help?  Call your doctor now or seek immediate medical care if:   You have new or severe belly pain.   You have a new or  higher fever.   You can't keep fluid or medicines down.  Watch closely for changes in your health, and be sure to contact your doctor if:   The symptoms you had when you first started feeling sick come back.   You do not get better as expected.   You need help to stop drinking alcohol.   Where can you learn more?   Go to [email or web address] and sign in to your MyChart account. Enter J381 in the Search Health Information box to learn more about "Pancreatitis: After Your Visit."    If you do not have an account, please click on the "Sign Up Now" link.      2006-2014 [employer]. Care instructions adapted under license by [facility]. This care instruction is for use with your licensed healthcare professional. If you have questions about a medical condition or this instruction, always ask your healthcare professional. [employer], Inc[employer] disclaims any warranty or liability for your use of this information.  Content Version: 10.3.368381; Current as of: November 28, 2011

## 2013-02-25 NOTE — Discharge Summary (Signed)
[facility] Medicine Discharge Summary      Patient ID: Dustin Zimmerman    MRN: [id number]     Acct:  [id number]       Patient's PCP: Alger Memos, MD    Admit Date: 02/23/2013     Discharge Date: 02/25/2013      Admitting Physician: Meriel Flavors, MD    Discharge Physician: Chaney Malling. Conner, MD     Discharge Diagnoses:  Primary Problem:  Acute pancreatitis [facility])  Additional Diagnoses:  Active Hospital Problems    Diagnosis Date Noted   ??? Acute pancreatitis (HCC) [577.0] 02/24/2013   ??? Hypothyroid [244.9] 02/24/2013   ??? Hyperglycemia [790.29] 02/24/2013   Chronic Problems:  Past Medical History   Diagnosis Date   ??? Hypothyroid      The patient was seen and examined on day of discharge and this discharge summary is in conjunction with any daily progress note from day of discharge.    Hospital Course: ARTEMIO DOBIE is a 62 y.o. male who presents With complaints of lower abdominal pain that started yesterday. He complainedf low abdominal pain over the bladder and in the left and right lower quadrants that he rates a 9 and describes as bloating and cramping. He admited to nausea and vomiting as well. No diarrhea or constipation. His last bowel movement was this morning. No recent fevers or illnesses. The pain does not radiate to the groin or back. He denies any CP or SOB.  He was found to have an elevated lipase and inflammation around the pancreas on CT abd.  He was admitted for acute pancreatitis and started on IVF.  No evidence of gallstones or heavy drinking.  Triglycerides were normal.  GI was consulted and didn't think he needed an MRI.  Tumor markers are pending.  His lipase quickly returned to normal and he is tolerating a po diet.  Stable for discharge.  Follow up with PCP and GI.      Consults:  GI    Significant Diagnostic Studies: as above, and as follows:     CT abd:  IMPRESSION:   1. There are inflammatory changes around the pancreas with associated   free fluid in the  peripancreatic region as well as the right paracolic   gutter. Findings are consistent with pancreatitis. No focal fluid   collection is present to suggest an abscess or pseudocyst.  2. Common duct measures 6-7 mm but no intrahepatic biliary ductal   dilatation is identified.  3. Small left pleural effusion    Treatments: as above    Disposition: home    Discharged Condition: Stable    Follow Up:  Alger Memos, MD in two weeks    Discharge Medications:    [place], [place]   Home Medication Instructions AVW:[id number]    Printed on:02/25/13 1509   Medication Information                      Levothyroxine Sodium (SYNTHROID PO)  Take  by mouth daily.                  Activity: activity as tolerated    Diet: regular diet    Time Spent on discharge is more than 20 minutes in the examination, evaluation, counseling and review of medications and discharge plan.      Electronically signed by Chaney Malling. Doylene Canard, MD on 02/25/2013 at 3:09 PM     Thank you  Dr. Alger Memos, MD for the opportunity to be involved in this patient's care.

## 2013-02-25 NOTE — Progress Notes (Signed)
Progress Note    02/25/2013   1:32 PM    Name:  Dustin Zimmerman  MRN:    [id number]     Acct:     [id number]   Room:  [id number]  IP Day: 2     Admit Date: 02/23/2013  9:57 AM  PCP: Alger Memos, MD    Subjective:     C/C:   Chief Complaint   Patient presents with   ??? Abdominal Pain       Interval History: Status: improved.  Patient's abdominal pain is mostly resolved.  He only feels it when he coughs.  No nausea, vomiting.  He tolerated a regular diet, wants to go home.    ROS:  Constitutional: negative for chills, fevers, sweats  Respiratory: negative for cough, dyspnea on exertion, hemoptysis, shortness of breath, wheezing  Cardiovascular: negative for chest pain, chest pressure/discomfort, dyspnea, lower extremity edema, palpitations  Gastrointestinal: negative for abdominal pain, constipation, diarrhea, nausea, vomiting  Neurological: negative for dizziness and headaches    Medications:     Allergies:   Allergies   Allergen Reactions   ??? Motrin [Ibuprofen]    ??? Percocet [Oxycodone-Acetaminophen]        Current Meds:   0.9 % sodium chloride bolus Once   0.9 % sodium chloride bolus Once   pantoprazole (PROTONIX) injection 40 mg Daily   And    sodium chloride (PF) 0.9 % injection 10 mL Daily   ondansetron (ZOFRAN) injection 4 mg Q6H PRN   HYDROmorphone HCl PF (DILAUDID) injection SOLN 1 mg Q3H PRN       Data:     Code Status:  No Order    History reviewed. No pertinent family history.    History     Social History   ??? Marital Status: Single     Spouse Name: N/A     Number of Children: N/A   ??? Years of Education: N/A     Occupational History   ??? Not on file.     Social History Main Topics   ??? Smoking status: Never Smoker    ??? Smokeless tobacco: Not on file   ??? Alcohol Use: Yes      Comment: occ   ??? Drug Use: Not on file   ??? Sexual Activity: Not on file     Other Topics Concern   ??? Not on file     Social History Narrative       Vitals:  BP 103/86   Pulse 86   Temp(Src) 97.7 ??F (36.5 ??C) (Oral)   Resp 20   Ht 6'  1" (1.854 m)   Wt 200 lb 6.4 oz (90.9 kg)   BMI 26.45 kg/m2   SpO2 96%  Temp (24hrs), Avg:98.4 ??F (36.9 ??C), Min:97.7 ??F (36.5 ??C), Max:99.1 ??F (37.3 ??C)    No results found for this basename: POCGLU,  in the last 72 hours    I/O (24Hr):    Intake/Output Summary (Last 24 hours) at 02/25/13 1332  Last data filed at 02/25/13 0600   Gross per 24 hour   Intake   2394 ml   Output   1775 ml   Net    619 ml       Labs:  Hematology:  Recent Labs      02/23/13   1116   WBC  16.5*   HGB  15.9   HCT  47.2   PLT  293     Chemistry:  Recent Labs      02/23/13   1116  02/24/13   0546  02/25/13   0535   NA  136  140  141   K  4.1  4.4  4.0   CL  97*  103  104   CO2  32*  30  30   GLUCOSE  150*  119*  136*   BUN  15  13  9    CREATININE  0.76  0.86  0.78   CALCIUM  8.6  7.9*  8.0*     Recent Labs      02/23/13   1116  02/24/13   0546  02/25/13   0535   PROT  6.8  5.4*  5.3*   LABALBU  4.0  3.1*  2.9*   LABA1C  5.9   --    --    AST  21  14  18    ALT  19  13  16    ALKPHOS  62  46  49   BILITOT  0.43  0.43  0.32   BILIDIR  <0.08  <0.08   --    AMYLASE  98  57   --    LIPASE  115*  48  33   CHOL   --   125   --    TRIG   --   70   --    HDL   --   45   --        Physical Examination:        General appearance: alert, cooperative and no distress  Mental Status: oriented to person, place and time and normal affect  Lungs: clear to auscultation bilaterally, normal effort  Heart: regular rate and rhythm, no murmur  Abdomen: soft, nontender, nondistended, bowel sounds present all four quadrants, no masses, hepatomegaly or splenomegaly  Extremities: no edema, redness or tenderness in the calves  Skin: no gross lesions, rashes, or induration    Assessment:        Primary Problem  Acute pancreatitis [facility])     Active Hospital Problems    Diagnosis Date Noted   ??? Acute pancreatitis (HCC) [577.0] 02/24/2013   ??? Hypothyroid [244.9] 02/24/2013   ??? Hyperglycemia [790.29] 02/24/2013     Past Medical History   Diagnosis Date   ??? Hypothyroid          Plan:        1. Tolerating po diet  2. Apprec GI consult, no MRI needed.  Tumor markers pending  3. Ok to [employer] home today  4. Follow up with PCP in 1-2 weeks      Electronically signed by Janeece Agee L. Conner, MD on 02/25/2013 at 1:32 PM

## 2013-02-25 NOTE — Plan of Care (Signed)
Problem: Pain  Goal: Control of acute pain  Outcome: Ongoing  Pt denies pain at this time. Pt resting.

## 2013-03-06 ENCOUNTER — Encounter

## 2013-03-06 NOTE — Progress Notes (Signed)
[facility] [place]  [address]  [place] [address]  Dept: [phone]  Dept Fax: [phone]    Dustin Zimmerman is a 62 y.o. male who presents today for his medical conditions/complaints as noted below.  JYAIRE KOUDELKA is c/o of Follow-Up from Hospital; and Abdominal Pain      Have you seen any other physician or provider since your last visit? yes [facility]    Have you had any other diagnostic tests since your last visit? yes     Have you changed or stopped any medications since your last visit including any over-the-counter medicines, vitamins, or herbal medicines? no     Are you taking all your prescribed medications? Yes  If NO, why? -  N/A           Patient Self-Management Goal for this visit.   What is your goal for your visit today? - discuss hospital visit and abdominal pain   Barriers to success: none   Plan for overcoming my barriers: N/A       Confidence: 10/10   Date goal set: 03/06/2013   Date expected to reach goal: 2week    Medical history Review  Past Medical, Family, and Social History reviewed and does contribute to the patient presenting condition    No health maintenance topics applied.    HPI:     HPI Comments: hosp for pancreastitis; l arm hurt and swelling    Abdominal Pain  This is a new problem. The current episode started 1 to 4 weeks ago. The problem has been gradually improving. The pain is located in the RLQ and LLQ. The pain is moderate. The quality of the pain is aching and sharp. The abdominal pain does not radiate. He has tried antibiotics for the symptoms. The treatment provided mild relief.       Past Medical History   Diagnosis Date   ??? Hypothyroid       Past Surgical History   Procedure Laterality Date   ??? Eye surgery         No family history on file.    History   Substance Use Topics   ??? Smoking status: Never Smoker    ??? Smokeless tobacco: Not on file   ??? Alcohol Use: Yes      Comment: occ      Current Outpatient  Prescriptions   Medication Sig Dispense Refill   ??? triamcinolone (KENALOG) 0.1 % cream     4   ??? TESTIM 50 MG/5GM GEL 1% gel        ??? LOTEMAX 0.5 % ophthalmic gel     10   ??? Levothyroxine Sodium (SYNTHROID PO) Take  by mouth daily.         No current facility-administered medications for this visit.     Allergies   Allergen Reactions   ??? Motrin [Ibuprofen]    ??? Percocet [Oxycodone-Acetaminophen]        No health maintenance topics applied.    Subjective:      Review of Systems   Constitutional: Positive for activity change. Negative for appetite change.   Respiratory: Negative for chest tightness.    Gastrointestinal: Positive for abdominal pain.       Objective:     Physical Exam   Constitutional: He appears well-developed.   HENT:   Head: Normocephalic.   Eyes: Pupils are equal, round, and reactive to light.   Neck: Normal range  of motion.   Cardiovascular: Normal rate.    Pulmonary/Chest: Effort normal.   Nursing note and vitals reviewed.    BP 138/92   Pulse 87   Temp(Src) 98.7 ??F (37.1 ??C) (Oral)   Ht 6' (1.829 m)   Wt 197 lb (89.359 kg)   BMI 26.71 kg/m2   SpO2 98%    Assessment:      1. Acute pancreatitis (HCC)    2. Left arm swelling        Plan:    check l arm doppler; recheck labs; send to dr Sherryll Burger for gi whom he has seen in the past  No Follow-up on file.    No orders of the defined types were placed in this encounter.     No orders of the defined types were placed in this encounter.       Patient given educational materials - see patient instructions.  Discussed use, benefit, and side effects of prescribed medications.  All patient questions answered.  Pt voiced understanding. Reviewed health maintenance.  Instructed to continue current medications, diet and exercise.  Patient agreed with treatment plan. Follow up as directed.     Electronically signed by Alger Memos, MD on 03/06/2013 at 2:20 PM

## 2013-04-15 LAB — CREATININE
Creatinine: 0.83 mg/dL (ref 0.70–1.20)
GFR African American: >60 mL/min (ref >60)
GFR Non-African American: >60 mL/min (ref >60)

## 2013-04-15 LAB — BASIC METABOLIC PANEL
Anion Gap: 13 mmol/L (ref 8–16)
BUN: 23 mg/dL (ref 8–23)
CO2: 29 mmol/L (ref 20–31)
Calcium: 8.8 mg/dL (ref 8.6–10.4)
Chloride: 104 mmol/L (ref 98–107)
Creatinine: 0.86 mg/dL (ref 0.70–1.20)
GFR African American: >60 mL/min (ref >60)
GFR Non-African American: >60 mL/min (ref >60)
Glucose: 103 mg/dL — ABNORMAL HIGH (ref 70–99)
Potassium: 4.5 mmol/L (ref 3.7–5.3)
Sodium: 141 mmol/L (ref 135–144)

## 2013-04-15 LAB — SEDIMENTATION RATE: Sed Rate: 1 mm (ref 0–10)

## 2013-04-15 LAB — LIPASE
Lipase: 41 U/L (ref 13–60)
Lipase: 41 U/L (ref 13–60)

## 2013-04-15 LAB — BUN: BUN: 24 mg/dL — ABNORMAL HIGH (ref 8–23)

## 2013-04-15 LAB — AMYLASE: Amylase: 42 U/L (ref 28–100)

## 2013-04-15 MED ADMIN — ioversol (OPTIRAY) 74 % injection 100 mL: 100 mL | INTRAVENOUS | @ 13:00:00 | NDC 00019133390

## 2013-04-21 ENCOUNTER — Ambulatory Visit: Payer: 59 | Admitting: Emergency Medicine

## 2013-04-22 MED ORDER — GADOPENTETATE DIMEGLUMINE 469.01 MG/ML IV SOLN
469.01 | Freq: Once | INTRAVENOUS | Status: AC | PRN
Start: 2013-04-22 — End: 2013-04-22
  Administered 2013-04-22: 20:00:00 20 mL via INTRAVENOUS

## 2013-06-02 MED ORDER — CICLOPIROX 8 % EX SOLN
8 % | CUTANEOUS | Status: DC
Start: 2013-06-02 — End: 2014-05-13

## 2013-06-02 MED ORDER — TAMSULOSIN HCL 0.4 MG PO CAPS
0.4 MG | ORAL_CAPSULE | Freq: Every day | ORAL | Status: DC
Start: 2013-06-02 — End: 2013-11-26

## 2013-06-02 NOTE — Progress Notes (Signed)
[facility]  [id number]  [place], [place] [id number]  [phone]      Dustin Zimmerman 63 y.o. male who presents today for his medical conditions/complaints as noted below.  Dustin Zimmerman is c/o of Annual Exam  .    Have you seen any other physician or provider since your last visit YES- [facility]    Have you had any other diagnostic tests since your last visit? yes - SEE EPIC    Have you changed or stopped any medications since your last visit including any over-the-counter medicines, vitamins, or herbal medicines? no     Are you taking all your prescribed medications? Yes  If NO, why? -  N/Zimmerman           Patient Self-Management Goal for this visit.   What is your goal for your visit today? - TO GET Zimmerman WELL EXAM AND LAB SLIP FOR ANNUAL BLOOD WORK    Barriers to success: none   Plan for overcoming my barriers: N/Zimmerman      Confidence: 10/10   Date goal set: 06/02/2013   Date expected to reach goal: 1day    Medical history Review  Past Medical, Family, and Social History reviewed and does contribute to the patient presenting condition    No health maintenance topics applied.      HPI:     HPI Comments: Episode of pancreatitis; now resolved; ct showed small l distal jugular thrombus; now feeling better. Also co onychomycosis    Other  Pertinent negatives include no anorexia, arthralgias, change in bowel habit or chills.   PT HERE FOR WELL EXAM, NORMAL SLEEP PATTERN, NORMAL AMOUNT OF STRESS, EXERCISES EXCESSIVELY. NEG FOR URINARY SX OR ISSUES. WANTS TO DISCUSS URINARY FREQUENCY AT NIGHT. ALSO WANTS TO DISCUSS RT GREAT TOE X'S 6 MONTHS.      Past Medical History   Diagnosis Date   ??? Hypothyroid       Past Surgical History   Procedure Laterality Date   ??? Eye surgery       No family history on file.  History   Substance Use Topics   ??? Smoking status: Never Smoker    ??? Smokeless tobacco: Never Used   ??? Alcohol Use: Yes      Comment: occ      Current Outpatient Prescriptions   Medication Sig Dispense  Refill   ??? valACYclovir (VALTREX) 500 MG tablet Take 1 tablet by mouth.       ??? dorzolamide-timolol (COSOPT) 22.3-6.8 MG/ML ophthalmic solution Place 2 drops into both eyes.    4   ??? triamcinolone (KENALOG) 0.1 % cream     4   ??? LOTEMAX 0.5 % ophthalmic gel     10   ??? Levothyroxine Sodium (SYNTHROID PO) Take  by mouth daily.         No current facility-administered medications for this visit.     Allergies   Allergen Reactions   ??? Motrin [Ibuprofen] Nausea And Vomiting     kidney   ??? Percocet [Oxycodone-Acetaminophen] Rash       Health Maintenance   Topic Date Due   ??? FLU VACCINE YEARLY (ADULT)  11/24/2013   ??? COLON CANCER SCREENING COLONOSCOPY  03/27/2015   ??? TETANUS VACCINE ADULT (11 YEARS AND UP)  06/03/2023   ??? ZOSTAVAX VACCINE  Addressed       Subjective:      Review of Systems   Constitutional: Negative for  chills and appetite change.   Respiratory: Negative for choking and chest tightness.    Gastrointestinal: Negative for anorexia and change in bowel habit.   Musculoskeletal: Negative for arthralgias.         Objective:     Physical Exam   Constitutional: He appears well-developed.   HENT:   Head: Normocephalic.   Eyes: Pupils are equal, round, and reactive to light.   Neck: Normal range of motion.   Cardiovascular: Normal rate.    Pulmonary/Chest: Effort normal.   Abdominal: Soft.   Genitourinary:   Prostate sl enlarged; no masses or nodules; ob- stool.   Musculoskeletal: He exhibits edema.   Nursing note and vitals reviewed.      Assessment:      1. Pancreatitis (HCC)    2. Well adult        Plan:    meds as written; he is off testim; check labs; flomax for bph sx  No Follow-up on file.  No orders of the defined types were placed in this encounter.     No orders of the defined types were placed in this encounter.       Patient given educational materials - see patient instructions.  Discussed use, benefit, and side effects of prescribed medications.  All patient questions answered.  Pt voiced  understanding. Reviewed health maintenance.  Instructed to continue current medications, diet and exercise.  Patient agreed with treatment plan. Follow up as directed below.     Electronically signed by Alger Memos, MD on 06/02/2013 at 10:16 AM

## 2013-06-12 LAB — LIPID PANEL
Chol/HDL Ratio: 4
Cholesterol, Total: 178
HDL: 44 mg/dL (ref 35–70)
LDL Calculated: 115 mg/dL (ref 0–160)
Triglycerides: 97 mg/dL
VLDL: 19 mg/dL

## 2013-09-08 ENCOUNTER — Encounter: Payer: Self-pay | Admitting: Emergency Medicine

## 2013-09-08 ENCOUNTER — Ambulatory Visit (INDEPENDENT_AMBULATORY_CARE_PROVIDER_SITE_OTHER): Payer: 59 | Admitting: Emergency Medicine

## 2013-09-08 VITALS — BP 110/76 | HR 67 | Temp 98.8°F | Resp 16 | Ht 71.0 in | Wt 330.6 lb

## 2013-09-08 DIAGNOSIS — R21 Rash and other nonspecific skin eruption: Secondary | ICD-10-CM

## 2013-09-08 DIAGNOSIS — R030 Elevated blood-pressure reading, without diagnosis of hypertension: Secondary | ICD-10-CM

## 2013-09-08 DIAGNOSIS — R7309 Other abnormal glucose: Secondary | ICD-10-CM

## 2013-09-08 DIAGNOSIS — IMO0001 Reserved for inherently not codable concepts without codable children: Secondary | ICD-10-CM

## 2013-09-08 DIAGNOSIS — E785 Hyperlipidemia, unspecified: Secondary | ICD-10-CM

## 2013-09-08 DIAGNOSIS — R739 Hyperglycemia, unspecified: Secondary | ICD-10-CM

## 2013-09-08 LAB — COMPLETE METABOLIC PANEL WITH GFR
ALT: 40 U/L (ref 0–53)
AST: 25 U/L (ref 0–37)
Albumin: 4.3 g/dL (ref 3.5–5.2)
Alkaline Phosphatase: 52 U/L (ref 39–117)
BUN: 12 mg/dL (ref 6–23)
CALCIUM: 9.5 mg/dL (ref 8.4–10.5)
CHLORIDE: 103 meq/L (ref 96–112)
CO2: 28 meq/L (ref 19–32)
CREATININE: 0.93 mg/dL (ref 0.50–1.35)
GFR, Est Non African American: 87 mL/min
Glucose, Bld: 82 mg/dL (ref 70–99)
Potassium: 3.9 mEq/L (ref 3.5–5.3)
Sodium: 139 mEq/L (ref 135–145)
Total Bilirubin: 0.8 mg/dL (ref 0.2–1.2)
Total Protein: 7.1 g/dL (ref 6.0–8.3)

## 2013-09-08 LAB — CBC WITH DIFFERENTIAL/PLATELET
BASOS ABS: 0.1 10*3/uL (ref 0.0–0.1)
Basophils Relative: 1 % (ref 0–1)
EOS PCT: 8 % — AB (ref 0–5)
Eosinophils Absolute: 0.4 10*3/uL (ref 0.0–0.7)
HEMATOCRIT: 44.1 % (ref 39.0–52.0)
Hemoglobin: 15.6 g/dL (ref 13.0–17.0)
LYMPHS ABS: 1.7 10*3/uL (ref 0.7–4.0)
LYMPHS PCT: 33 % (ref 12–46)
MCH: 30.9 pg (ref 26.0–34.0)
MCHC: 35.4 g/dL (ref 30.0–36.0)
MCV: 87.3 fL (ref 78.0–100.0)
MONO ABS: 0.6 10*3/uL (ref 0.1–1.0)
Monocytes Relative: 12 % (ref 3–12)
Neutro Abs: 2.4 10*3/uL (ref 1.7–7.7)
Neutrophils Relative %: 46 % (ref 43–77)
Platelets: 190 10*3/uL (ref 150–400)
RBC: 5.05 MIL/uL (ref 4.22–5.81)
RDW: 14.3 % (ref 11.5–15.5)
WBC: 5.3 10*3/uL (ref 4.0–10.5)

## 2013-09-08 LAB — LIPID PANEL
Cholesterol: 139 mg/dL (ref 0–200)
HDL: 30 mg/dL — ABNORMAL LOW (ref >39)
LDL Cholesterol: 82 mg/dL (ref 0–99)
Total CHOL/HDL Ratio: 4.6 Ratio
Triglycerides: 136 mg/dL (ref <150)
VLDL: 27 mg/dL (ref 0–40)

## 2013-09-08 LAB — POCT GLYCOSYLATED HEMOGLOBIN (HGB A1C): Hemoglobin A1C: 5.8

## 2013-09-08 LAB — GLUCOSE, POCT (MANUAL RESULT ENTRY): POC Glucose: 86 mg/dl (ref 70–99)

## 2013-09-08 MED ORDER — BETAMETHASONE DIPROPIONATE AUG 0.05 % EX CREA: TOPICAL_CREAM | Freq: Two times a day (BID) | CUTANEOUS | Status: DC

## 2013-09-08 NOTE — Progress Notes (Signed)
Subjective:    Patient ID: Malik GranaJimmy W [name], male    DOB: 01/23/1951, 63 y.o.   MRN: [id number] This chart was scribed for Malik GobbleSteven A Murna Backer, MD by Valera CastleSteven [name], ED Scribe. This patient was seen in room 21 and the patient's care was started at 1:54 PM.  Chief Complaint  Patient presents with  . Hypertension  . Hyperlipidemia   HPI Malik [name] is a 63 y.o. male Pt presents with h/o HTN, hyperlipidemia, and DM presents for a follow up visit. His last visit here was 12/2012.  Pt reports he has been taking his medication regularly and states he has been feeling well. He takes Simvastatin 80 mg. He has been exercising regularly. He last ate oatmeal this morning around 8:30 AM.   He reports a red, itching rash over his right scapula, onset 3 days ago. He states he was mowing outside a day or two before onset of his rash. He denies the rash being painful. He denies any other symptoms.   Patient Active Problem List   Diagnosis Date Noted  . Aortic stenosis, moderate 01/06/2013  . Murmur 11/17/2012  . Dyspnea 11/17/2012  . Hypertension 12/18/2011  . Hyperglycemia 12/18/2011  . Hyperlipidemia 12/18/2011  . Arthritis 12/18/2011  . Obesity 12/18/2011   Past Medical History  Diagnosis Date  . Hypertension   . Hyperglycemia   . Hyperlipidemia   . Arthritis   . Obesity    Past Surgical History  Procedure Laterality Date  . Appendectomy     No Known Allergies Prior to Admission medications   Medication Sig Start Date End Date Taking? Authorizing Provider  aspirin 325 MG EC tablet Take 325 mg by mouth daily.    Historical Provider, MD  hydrochlorothiazide (HYDRODIURIL) 25 MG tablet Take 1 tablet (25 mg total) by mouth daily. NEED REFILLS FOR 1 YEAR 09/16/12   Malik GobbleSteven A Destan Franchini, MD  lisinopril (PRINIVIL,ZESTRIL) 20 MG tablet Take 1 tablet (20 mg total) by mouth daily. NEED REFILLS FOR 1 YEAR 09/16/12   Malik GobbleSteven A Malik Steagall, MD  metoprolol succinate (TOPROL-XL) 100 MG 24 hr tablet Take 1 tablet  (100 mg total) by mouth daily. Take with or immediately following a meal. NEED REFILLS FOR 1 YEAR 09/16/12 09/16/13  Malik GobbleSteven A Malik Sliter, MD  simvastatin (ZOCOR) 80 MG tablet Take 1 tablet (80 mg total) by mouth at bedtime. NEED REFILLS FOR  1 YEAR 09/16/12 09/16/13  Malik GobbleSteven A Malik Hallum, MD   Review of Systems  Constitutional: Negative for fever, chills and diaphoresis.  Respiratory: Negative for cough.   Cardiovascular: Negative for chest pain.  Gastrointestinal: Negative for abdominal pain.  Musculoskeletal: Negative for arthralgias and myalgias.  Skin: Positive for color change and rash (over right scapula, red with itchiness).      Objective:   Physical Exam  Nursing note and vitals reviewed. Constitutional: He is oriented to person, place, and time. He appears well-developed and well-nourished. No distress.  HENT:  Head: Normocephalic and atraumatic.  Eyes: Conjunctivae and EOM are normal. Pupils are equal, round, and reactive to light. No scleral icterus.  Neck: Normal range of motion. Neck supple. No thyromegaly present.  Cardiovascular: Normal rate and regular rhythm.   Murmur heard.  Systolic murmur is present with a grade of 2/6  2/6 harsh systolic murmur in the aortic area.  Pulmonary/Chest: Effort normal and breath sounds normal. No respiratory distress. He exhibits no tenderness.  Abdominal: Soft. Bowel sounds are normal. He exhibits no mass. There is no tenderness.  Musculoskeletal: Normal range of motion.  Lymphadenopathy:    He has no cervical adenopathy.  Neurological: He is alert and oriented to person, place, and time.  Skin: Skin is warm and dry. Rash noted.  Confluent rash over the back of right shoulder with multiple tiny firm blisters.  Psychiatric: He has a normal mood and affect. His behavior is normal.   Results for orders placed in visit on 09/08/13  POCT GLYCOSYLATED HEMOGLOBIN (HGB A1C)      Result Value Ref Range   Hemoglobin A1C 5.8    GLUCOSE, POCT (MANUAL RESULT  ENTRY)      Result Value Ref Range   POC Glucose 86  70 - 99 mg/dl   BP 295/62110/76  Pulse 67  Temp(Src) 98.8 F (37.1 C) (Oral)  Resp 16  Ht 5\' 11"  (1.803 m)  Wt 330 lb 9.6 oz (149.959 kg)  BMI 46.13 kg/m2  SpO2 97%     Assessment & Plan:  No change in medication sugars good blood pressures good recheck four months  I personally performed the services described in this documentation, which was scribed in my presence. The recorded information has been reviewed and is accurate.

## 2013-09-11 LAB — WOUND CULTURE
GRAM STAIN: NONE SEEN
Gram Stain: NONE SEEN

## 2013-09-29 NOTE — Progress Notes (Signed)
After obtaining consent, and per orders of Kelly Smith,CNP, injection of Tdap given by Amariya Liskey. Patient instructed to remain in clinic for 20 minutes afterwards, and to report any adverse reaction to me immediately.

## 2013-11-09 ENCOUNTER — Other Ambulatory Visit: Payer: Self-pay | Admitting: Emergency Medicine

## 2013-11-26 MED ORDER — LEVOTHYROXINE SODIUM 50 MCG PO TABS
50 MCG | ORAL_TABLET | Freq: Every day | ORAL | Status: DC
Start: 2013-11-26 — End: 2015-01-28

## 2013-11-26 NOTE — Progress Notes (Signed)
[facility] [place]  [address]  [place] [address]  Dept: [phone]  Dept Fax: [phone]    Dustin Zimmerman is a 63 y.o. male who presents today for his medical conditions/complaints as noted below.  Dustin Zimmerman is c/o of   Chief Complaint   Patient presents with   ??? Annual Exam       Have you seen any other physician or provider since your last visit? no    Have you had any other diagnostic tests since your last visit? no    Have you changed or stopped any medications since your last visit including any over-the-counter medicines, vitamins, or herbal medicines? no     Are you taking all your prescribed medications? Yes  If NO, why? -  N/A           Patient Self-Management Goal for this visit.   What is your goal for your visit today? - WELL EXAM   Barriers to success: none   Plan for overcoming my barriers: N/A      Confidence: 10/10   Date goal set: 11/26/13   Date expected to reach goal: 1day    Medical history Review  Past Medical, Family, and Social History reviewed and does contribute to the patient presenting condition    Health Maintenance Due   Topic Date Due   ??? FLU VACCINE YEARLY (ADULT)  11/24/2013         HPI:     HPI Comments: Well check; feeling ok; labs done in march looked ok   Patient here for well exam, no new complaints at this time.     Hemoglobin A1C (%)   Date Value   02/23/2013 5.9    09/22/2012 5.4              ( goal A1C is < 7)   No results found for this basename: labmicr     LDL Cholesterol (mg/dL)   Date Value   72/07/3662 66         LDL Calculated (mg/dL)   Date Value   06/26/4740 115        (goal LDL is <100)   AST (U/L)   Date Value   02/25/2013 18         ALT (U/L)   Date Value   02/25/2013 16         BUN (mg/dL)   Date Value   5/95/6387 24*     BP Readings from Last 3 Encounters:   11/26/13 112/74   09/29/13 132/78   06/02/13 110/80          (goal 120/80)    Past Medical History   Diagnosis Date   ??? Hypogonadism    ???  Lumbago    ??? Malaise and fatigue    ??? Myalgia    ??? Hypothyroid       Past Surgical History   Procedure Laterality Date   ??? Foot surgery       middle toe   ??? Knee surgery     ??? Shoulder surgery     ??? Eye surgery         No family history on file.    History   Substance Use Topics   ??? Smoking status: Former Smoker   ??? Smokeless tobacco: Never Used   ??? Alcohol Use: 0.0 oz/week     0 Not specified per week  Comment: occ      Current Outpatient Prescriptions   Medication Sig Dispense Refill   ??? levothyroxine (SYNTHROID) 50 MCG tablet Take 1 tablet by mouth daily  90 tablet  3   ??? triamcinolone (KENALOG) 0.1 % cream     4   ??? prednisoLONE acetate (PRED FORTE) 1 % ophthalmic suspension        ??? sildenafil (VIAGRA) 100 MG tablet Take 1 tablet by mouth as needed for Erectile Dysfunction.  40 tablet  1   ??? ciclopirox (PENLAC) 8 % solution Apply topically nightly.  1 Bottle  2     No current facility-administered medications for this visit.     Allergies   Allergen Reactions   ??? Motrin [Ibuprofen]    ??? Percocet [Oxycodone-Acetaminophen] Hives   ??? Motrin [Ibuprofen] Nausea And Vomiting     kidney   ??? Percocet [Oxycodone-Acetaminophen] Rash       Health Maintenance   Topic Date Due   ??? FLU VACCINE YEARLY (ADULT)  11/24/2013   ??? COLON CANCER SCREENING COLONOSCOPY  12/17/2022   ??? TETANUS VACCINE ADULT (11 YEARS AND UP)  09/30/2023   ??? ZOSTAVAX VACCINE  Addressed       Subjective:      Review of Systems   Constitutional: Negative for activity change, appetite change and unexpected weight change.   HENT: Negative for dental problem, hearing loss and trouble swallowing.    Eyes: Negative for pain.   Respiratory: Negative for chest tightness and shortness of breath.    Cardiovascular: Negative for chest pain.   Gastrointestinal: Negative for blood in stool and abdominal distention.   Genitourinary: Negative for frequency.   Musculoskeletal: Negative for arthralgias.   Neurological: Negative for headaches.   Psychiatric/Behavioral:  Negative for decreased concentration.       Objective:     Physical Exam   Constitutional: He appears well-developed.   HENT:   Head: Normocephalic.   Eyes: Pupils are equal, round, and reactive to light.   Neck: Normal range of motion.   Cardiovascular: Normal rate.    Pulmonary/Chest: Effort normal.   Abdominal: Soft.   Musculoskeletal: Normal range of motion.   Neurological: He is alert.   Skin: Skin is warm and dry.   Psychiatric: He has a normal mood and affect.   Nursing note and vitals reviewed.    BP 112/74 mmHg   Pulse 109   Temp(Src) 97.6 ??F (36.4 ??C) (Oral)   Ht 6' (1.829 m)   Wt 198 lb (89.812 kg)   BMI 26.85 kg/m2   SpO2 97%    Assessment:      1. Well adult              Plan:    cont meds; rechjeck 6 months  No Follow-up on file.    No orders of the defined types were placed in this encounter.     Orders Placed This Encounter   Medications   ??? levothyroxine (SYNTHROID) 50 MCG tablet     Sig: Take 1 tablet by mouth daily     Dispense:  90 tablet     Refill:  3       Patient given educational materials - see patient instructions.  Discussed use, benefit, and side effects of prescribed medications.  All patient questions answered. Pt voiced understanding. Reviewed health maintenance.  Instructed to continue current medications, diet and exercise.  Patient agreed with treatment plan. Follow up as directed.  Electronically signed by Alger Memos, MD on 11/26/2013 at 3:49 PM

## 2013-11-29 MED ORDER — SULFAMETHOXAZOLE-TMP DS 800-160 MG PO TABS
800-160 MG | ORAL_TABLET | Freq: Two times a day (BID) | ORAL | Status: AC
Start: 2013-11-29 — End: 2013-12-06

## 2013-11-29 MED ADMIN — cefTRIAXone (ROCEPHIN) injection 1 g: 1 g | INTRAMUSCULAR | @ 20:00:00 | NDC 00409733201

## 2013-11-29 NOTE — Patient Instructions (Signed)
Ingrown Toenail: Care Instructions  Your Care Instructions     An ingrown toenail often occurs because a nail is not trimmed correctly or because shoes are too tight. An ingrown nail can cause an infection.  If your toe is infected, your doctor may prescribe antibiotics. Most ingrown toenails can be treated at home. You should trim toenails straight across, so the ends of the nail grow over the skin and not into it. Good nail care can prevent ingrown toenails.  Follow-up care is a key part of your treatment and safety. Be sure to make and go to all appointments, and call your doctor if you are having problems. It's also a good idea to know your test results and keep a list of the medicines you take.  How can you care for yourself at home?  ?? Trim the nails straight across. Leave the corners a little longer so they do not cut into the skin. To do this when you have an ingrown nail:  ?? Soak your foot in warm water for about 15 minutes to soften the nail.  ?? Wedge a small piece of wet cotton under the corner of the nail to cushion the nail and lift it slightly. This keeps it from cutting the skin.  ?? Repeat daily until the nail has grown out and can be trimmed.  ?? Do not use manicure scissors to dig under the ingrown nail. You might stab your toe, which could get infected.  ?? Do not trim your toenails too short.  ?? Check with your doctor before trimming your own toenails if you have been diagnosed with diabetes or peripheral arterial disease. These conditions increase the risk of an infection, because you may have decreased sensation in your toes and cut yourself without knowing it.  ?? Wear roomy, comfortable shoes.  ?? If your doctor prescribed antibiotics, take them as directed. Do not stop taking them just because you feel better. You need to take the full course of antibiotics.  When should you call for help?  Call your doctor now or seek immediate medical care if:  ?? You have signs of infection, such  as:  ?? Increased pain, swelling, warmth, or redness.  ?? Red streaks leading from the toe.  ?? Pus draining from the toe.  ?? A fever.  Watch closely for changes in your health, and be sure to contact your doctor if:  ?? You have problems trimming your nails.  ?? You do not get better as expected.   Where can you learn more?   Go to [email or web address] and sign in to your MyChart account. Enter R135 in the Search Health Information box to learn more about ???Ingrown Toenail: Care Instructions.???    If you do not have an account, please click on the ???Sign Up Now??? link.     ?? 2006-2015 [employer]. Care instructions adapted under license by [facility]. This care instruction is for use with your licensed healthcare professional. If you have questions about a medical condition or this instruction, always ask your healthcare professional. [employer] disclaims any warranty or liability for your use of this information.  Content Version: 10.6.465758; Current as of: May 15, 2013

## 2013-11-29 NOTE — Progress Notes (Signed)
[facility] [place]  [address]  [place] [address]  Dept: [phone]  Dept Fax: [phone]    Dustin Zimmerman is a 63 y.o. male who presents to the urgent care today for his medical conditions/complaints as noted below.  Dustin Zimmerman is c/o of Toe Pain      HPI:     Toe Pain   The incident occurred 2 days ago. The incident occurred at home. Injury mechanism: ingrown toe nail  The pain is present in the right toes. The quality of the pain is described as aching. Pertinent negatives include no numbness. He reports no foreign bodies present.     Has had ongoing issues with ingrown toenails, cut out concerning area 2 days ago, now red, swollen and tender, no fever or chills. Done a lot more walking today and aggravating it  Past Medical History   Diagnosis Date   ??? Hypogonadism    ??? Lumbago    ??? Malaise and fatigue    ??? Myalgia    ??? Hypothyroid         Current Outpatient Prescriptions   Medication Sig Dispense Refill   ??? sulfamethoxazole-trimethoprim (BACTRIM DS) 800-160 MG per tablet Take 1 tablet by mouth 2 times daily for 7 days  14 tablet  0   ??? levothyroxine (SYNTHROID) 50 MCG tablet Take 1 tablet by mouth daily  90 tablet  3   ??? ciclopirox (PENLAC) 8 % solution Apply topically nightly.  1 Bottle  2   ??? prednisoLONE acetate (PRED FORTE) 1 % ophthalmic suspension        ??? sildenafil (VIAGRA) 100 MG tablet Take 1 tablet by mouth as needed for Erectile Dysfunction.  40 tablet  1   ??? triamcinolone (KENALOG) 0.1 % cream     4     Current Facility-Administered Medications   Medication Dose Route Frequency Provider Last Rate Last Dose   ??? cefTRIAXone (ROCEPHIN) injection 1,000 mg  1,000 mg Intramuscular Once Tanna Furry, CNP         Allergies   Allergen Reactions   ??? Motrin [Ibuprofen]    ??? Percocet [Oxycodone-Acetaminophen] Hives   ??? Motrin [Ibuprofen] Nausea And Vomiting     kidney   ??? Percocet [Oxycodone-Acetaminophen] Rash       Subjective:      Review  of Systems   Constitutional: Positive for activity change. Negative for fever and chills.   Respiratory: Negative for shortness of breath.    Cardiovascular: Negative for chest pain.   Musculoskeletal: Positive for joint swelling. Negative for gait problem.   Skin: Positive for color change.   Allergic/Immunologic: Negative for immunocompromised state.   Neurological: Negative for weakness and numbness.       Objective:     Physical Exam   Constitutional: He is oriented to person, place, and time. He appears well-developed and well-nourished. No distress.   HENT:   Head: Normocephalic and atraumatic.   Musculoskeletal: Normal range of motion. He exhibits tenderness (right great toe, erythema, mild swelling and tenderenss, possible infection when cutting out nail).   Neurological: He is alert and oriented to person, place, and time.   Skin: Skin is warm and dry.   Psychiatric: He has a normal mood and affect. His behavior is normal.   Nursing note and vitals reviewed.    BP 133/90 mmHg   Pulse 82   Temp(Src) 98 ??F (36.7 ??C) (Tympanic)   Resp  18   Ht  (1.854 m)   Wt 195 lb (88.451 kg)   BMI 25.73 kg/m2    Assessment:      1. Ingrown right big toenail  cefTRIAXone (ROCEPHIN) injection 1,000 mg    sulfamethoxazole-trimethoprim (BACTRIM DS) 800-160 MG per tablet       Plan:      Return if symptoms worsen or fail to improve.    Orders Placed This Encounter   Medications   ??? cefTRIAXone (ROCEPHIN) injection 1,000 mg     Sig:    ??? sulfamethoxazole-trimethoprim (BACTRIM DS) 800-160 MG per tablet     Sig: Take 1 tablet by mouth 2 times daily for 7 days     Dispense:  14 tablet     Refill:  0    warm water soaks with epsom salt QID  Avoid cutting nails to short  Fu with pcp if not better in 2-3 days       Patient given educational materials - see patient instructions.  Discussed use, benefit, and side effects of prescribed medications.  All patient questions answered.  Pt voiced understanding.    Electronically signed by  Tanna Furry, CNP on 11/29/2013 at 3:29 PM

## 2013-12-30 ENCOUNTER — Telehealth: Payer: Self-pay | Admitting: *Deleted

## 2013-12-30 DIAGNOSIS — R911 Solitary pulmonary nodule: Secondary | ICD-10-CM

## 2013-12-30 NOTE — Telephone Encounter (Signed)
LMTCB RE  NEEDING  F/U CHEST  CT ON LUNG  NODULE WAS DUE IN SEPT .Zack Seal/CY

## 2014-01-06 NOTE — Telephone Encounter (Signed)
Spoke to [name] regarding scheduling Lung Ct.   Per patient [facility] He want  to discuss this with him.  He will call me back next week to let us know what he want to do. saf

## 2014-01-12 ENCOUNTER — Encounter: Payer: Self-pay | Admitting: Emergency Medicine

## 2014-01-12 ENCOUNTER — Ambulatory Visit (INDEPENDENT_AMBULATORY_CARE_PROVIDER_SITE_OTHER): Payer: 59 | Admitting: Emergency Medicine

## 2014-01-12 VITALS — BP 119/85 | HR 73 | Temp 98.1°F | Resp 16 | Ht 71.5 in | Wt 330.0 lb

## 2014-01-12 DIAGNOSIS — Z23 Encounter for immunization: Secondary | ICD-10-CM

## 2014-01-12 DIAGNOSIS — R739 Hyperglycemia, unspecified: Secondary | ICD-10-CM

## 2014-01-12 DIAGNOSIS — E669 Obesity, unspecified: Secondary | ICD-10-CM

## 2014-01-12 DIAGNOSIS — E785 Hyperlipidemia, unspecified: Secondary | ICD-10-CM

## 2014-01-12 DIAGNOSIS — IMO0001 Reserved for inherently not codable concepts without codable children: Secondary | ICD-10-CM

## 2014-01-12 DIAGNOSIS — R911 Solitary pulmonary nodule: Secondary | ICD-10-CM

## 2014-01-12 LAB — GLUCOSE, POCT (MANUAL RESULT ENTRY): POC Glucose: 118 mg/dl — AB (ref 70–99)

## 2014-01-12 LAB — POCT GLYCOSYLATED HEMOGLOBIN (HGB A1C): HEMOGLOBIN A1C: 5.7

## 2014-01-12 NOTE — Progress Notes (Signed)
Subjective:  This chart was scribed for Malik ChrisSteven Khiara Shuping, MD by Carl Bestelina Holson, Medical Scribe. This patient was seen in Room 23 and the patient's care was started at 8:59 AM.   Patient ID: Malik [name], male    DOB: 10/26/1950, 63 y.o.   MRN: [id number]  HPI HPI Comments: Malik GranaJimmy W [name] is a 63 y.o. male who [facility] the [facility] for a DM follow-up.  He had a CT scan done of his chest that revealed a 5mm nodule in the left upper lobe.  Pt denies any chest pain or abdominal symptoms.  He feels fine.  He wants to discuss the need for having a follow-up CT.     Patient Active Problem List   Diagnosis Date Noted  . Aortic stenosis, moderate 01/06/2013  . Murmur 11/17/2012  . Dyspnea 11/17/2012  . Hypertension 12/18/2011  . Hyperglycemia 12/18/2011  . Hyperlipidemia 12/18/2011  . Arthritis 12/18/2011  . Obesity 12/18/2011   Past Medical History  Diagnosis Date  . Hypertension   . Hyperglycemia   . Hyperlipidemia   . Arthritis   . Obesity    Past Surgical History  Procedure Laterality Date  . Appendectomy     No Known Allergies Prior to Admission medications   Medication Sig Start Date End Date Taking? Authorizing Provider  aspirin 325 MG EC tablet Take 325 mg by mouth daily.    Historical Provider, MD  augmented betamethasone dipropionate (DIPROLENE AF) 0.05 % cream Apply topically 2 (two) times daily. 09/08/13   Collene GobbleSteven A Peja Allender, MD  hydrochlorothiazide (HYDRODIURIL) 25 MG tablet TAKE 1 TABLET BY MOUTH EVERY DAY 11/10/13   Collene GobbleSteven A Fin Hupp, MD  lisinopril (PRINIVIL,ZESTRIL) 20 MG tablet TAKE 1 TABLET BY MOUTH EVERY DAY 11/10/13   Collene GobbleSteven A Hanifa Antonetti, MD  metoprolol succinate (TOPROL-XL) 100 MG 24 hr tablet TAKE 1 TABLET BY MOUTH ONCE DAILY. TAKE WITH OR IMMEDIATELY FOLLOWING A MEAL. 11/10/13   Collene GobbleSteven A Moana Munford, MD  simvastatin (ZOCOR) 80 MG tablet TAKE 1 TABLET BY MOUTH EVERY NIGHT AT BEDTIME 11/10/13   Collene GobbleSteven A Shylin Keizer, MD   History   Social History  . Marital Status:  Divorced    Spouse Name: N/A    Number of Children: N/A  . Years of Education: N/A   Occupational History  . Not on file.   Social History Main Topics  . Smoking status: Former Smoker -- 20 years    Types: Cigarettes, Cigars    Quit date: 08/19/1978  . Smokeless tobacco: Not on file  . Alcohol Use: No  . Drug Use: Not on file  . Sexual Activity: Not on file   Other Topics Concern  . Not on file   Social History Narrative  . No narrative on file    Review of Systems  Cardiovascular: Negative for chest pain.  Gastrointestinal: Negative for abdominal pain.     Objective:  Physical Exam  Nursing note and vitals reviewed. Constitutional: He is oriented to person, place, and time. He appears well-developed and well-nourished.  Significantly overweight.  HENT:  Head: Normocephalic and atraumatic.  Right Ear: External ear normal.  Left Ear: External ear normal.  Mouth/Throat: Oropharynx is clear and moist. No oropharyngeal exudate.  Eyes: Conjunctivae and EOM are normal. Pupils are equal, round, and reactive to light.  Neck: Normal range of motion. Neck supple. No thyromegaly present.  Cardiovascular: Normal rate, regular rhythm and normal heart sounds.   No murmur heard. Pulmonary/Chest: Effort normal and  breath sounds normal. No respiratory distress. He has no wheezes. He has no rales.  Abdominal: Soft. There is no tenderness.  Musculoskeletal: Normal range of motion.  Lymphadenopathy:    He has no cervical adenopathy.  Neurological: He is alert and oriented to person, place, and time.  Filament testing of his feet is normal.   Skin: Skin is warm and dry. No rash noted.  Psychiatric: He has a normal mood and affect. His behavior is normal.    Results for orders placed in visit on 01/12/14  GLUCOSE, POCT (MANUAL RESULT ENTRY)      Result Value Ref Range   POC Glucose 118 (*) 70 - 99 mg/dl  POCT GLYCOSYLATED HEMOGLOBIN (HGB A1C)      Result Value Ref Range    Hemoglobin A1C 5.7       BP 119/85  Pulse 73  Temp(Src) 98.1 F (36.7 C)  Resp 16  Ht 5' 11.5" (1.816 m)  Wt 330 lb (149.687 kg)  BMI 45.39 kg/m2  SpO2 92% Assessment & Plan:  Pt is at goal for his DM and BP.  Will continue with diet and exercise.  I advised him to have his follow-up CT scan done of his 5mm left upper lobe nodule.  Flu shot was administered today.  I personally performed the services described in this documentation, which was scribed in my presence. The recorded information has been reviewed and is accurate.

## 2014-01-15 NOTE — Telephone Encounter (Signed)
PT'S  PMD  ADDRESSED   AND ORDERED SEE OFFICE  NOTE .Zack Seal/CY

## 2014-01-28 ENCOUNTER — Ambulatory Visit
Admission: RE | Admit: 2014-01-28 | Discharge: 2014-01-28 | Disposition: A | Discharge status: Home / Self Care | Payer: 59 | Source: Ambulatory Visit | Attending: Emergency Medicine | Admitting: Emergency Medicine

## 2014-01-28 DIAGNOSIS — IMO0001 Reserved for inherently not codable concepts without codable children: Secondary | ICD-10-CM

## 2014-01-28 DIAGNOSIS — R911 Solitary pulmonary nodule: Secondary | ICD-10-CM

## 2014-03-10 ENCOUNTER — Other Ambulatory Visit: Payer: Self-pay | Admitting: Emergency Medicine

## 2014-03-11 NOTE — Telephone Encounter (Signed)
Dr Cleta Albertsaub, pt saw you in Oct for 4 mos check up but don't see HTN addressed. Do you want to OK 90 day supplies of all meds (pt has appt 05/13/14)

## 2014-05-13 ENCOUNTER — Ambulatory Visit: Admit: 2014-05-13 | Discharge: 2014-05-13 | Payer: BLUE CROSS/BLUE SHIELD | Attending: Family Medicine | Primary: Family

## 2014-05-13 ENCOUNTER — Encounter: Payer: Self-pay | Admitting: Emergency Medicine

## 2014-05-13 ENCOUNTER — Ambulatory Visit (INDEPENDENT_AMBULATORY_CARE_PROVIDER_SITE_OTHER): Payer: 59 | Admitting: Emergency Medicine

## 2014-05-13 VITALS — BP 132/84 | HR 71 | Temp 97.4°F | Resp 16 | Ht 71.5 in | Wt 331.0 lb

## 2014-05-13 DIAGNOSIS — R911 Solitary pulmonary nodule: Secondary | ICD-10-CM

## 2014-05-13 DIAGNOSIS — E119 Type 2 diabetes mellitus without complications: Secondary | ICD-10-CM

## 2014-05-13 DIAGNOSIS — I350 Nonrheumatic aortic (valve) stenosis: Secondary | ICD-10-CM

## 2014-05-13 DIAGNOSIS — R030 Elevated blood-pressure reading, without diagnosis of hypertension: Secondary | ICD-10-CM

## 2014-05-13 DIAGNOSIS — Z23 Encounter for immunization: Secondary | ICD-10-CM

## 2014-05-13 DIAGNOSIS — E785 Hyperlipidemia, unspecified: Secondary | ICD-10-CM

## 2014-05-13 DIAGNOSIS — Z0000 Encounter for general adult medical examination without abnormal findings: Secondary | ICD-10-CM

## 2014-05-13 DIAGNOSIS — IMO0001 Reserved for inherently not codable concepts without codable children: Secondary | ICD-10-CM

## 2014-05-13 DIAGNOSIS — Z125 Encounter for screening for malignant neoplasm of prostate: Secondary | ICD-10-CM

## 2014-05-13 DIAGNOSIS — K625 Hemorrhage of anus and rectum: Secondary | ICD-10-CM

## 2014-05-13 LAB — CBC WITH DIFFERENTIAL/PLATELET
BASOS ABS: 0 10*3/uL (ref 0.0–0.1)
BASOS PCT: 1 % (ref 0–1)
EOS ABS: 0.3 10*3/uL (ref 0.0–0.7)
EOS PCT: 6 % — AB (ref 0–5)
HCT: 45.2 % (ref 39.0–52.0)
Hemoglobin: 15.8 g/dL (ref 13.0–17.0)
Lymphocytes Relative: 32 % (ref 12–46)
Lymphs Abs: 1.4 10*3/uL (ref 0.7–4.0)
MCH: 30.7 pg (ref 26.0–34.0)
MCHC: 35 g/dL (ref 30.0–36.0)
MCV: 87.9 fL (ref 78.0–100.0)
MPV: 10.4 fL (ref 8.6–12.4)
Monocytes Absolute: 0.4 10*3/uL (ref 0.1–1.0)
Monocytes Relative: 9 % (ref 3–12)
Neutro Abs: 2.3 10*3/uL (ref 1.7–7.7)
Neutrophils Relative %: 52 % (ref 43–77)
Platelets: 178 10*3/uL (ref 150–400)
RBC: 5.14 MIL/uL (ref 4.22–5.81)
RDW: 14 % (ref 11.5–15.5)
WBC: 4.4 10*3/uL (ref 4.0–10.5)

## 2014-05-13 LAB — LIPID PANEL
CHOL/HDL RATIO: 5 ratio
Cholesterol: 129 mg/dL (ref 0–200)
HDL: 26 mg/dL — ABNORMAL LOW (ref >39)
LDL Cholesterol: 77 mg/dL (ref 0–99)
Triglycerides: 130 mg/dL (ref <150)
VLDL: 26 mg/dL (ref 0–40)

## 2014-05-13 LAB — COMPLETE METABOLIC PANEL WITH GFR
ALBUMIN: 4.2 g/dL (ref 3.5–5.2)
ALK PHOS: 56 U/L (ref 39–117)
ALT: 37 U/L (ref 0–53)
AST: 24 U/L (ref 0–37)
BUN: 11 mg/dL (ref 6–23)
CO2: 27 meq/L (ref 19–32)
CREATININE: 0.95 mg/dL (ref 0.50–1.35)
Calcium: 9.3 mg/dL (ref 8.4–10.5)
Chloride: 104 mEq/L (ref 96–112)
GFR, EST NON AFRICAN AMERICAN: 84 mL/min
GFR, Est African American: >89 mL/min
Glucose, Bld: 103 mg/dL — ABNORMAL HIGH (ref 70–99)
Potassium: 4 mEq/L (ref 3.5–5.3)
SODIUM: 140 meq/L (ref 135–145)
Total Bilirubin: 0.6 mg/dL (ref 0.2–1.2)
Total Protein: 6.8 g/dL (ref 6.0–8.3)

## 2014-05-13 LAB — POCT URINALYSIS DIPSTICK
BILIRUBIN UA: NEGATIVE
Blood, UA: NEGATIVE
GLUCOSE UA: NEGATIVE
Ketones, UA: NEGATIVE
LEUKOCYTES UA: NEGATIVE
NITRITE UA: NEGATIVE
Protein, UA: NEGATIVE
Spec Grav, UA: 1.01
Urobilinogen, UA: 0.2
pH, UA: 7

## 2014-05-13 LAB — POCT GLYCOSYLATED HEMOGLOBIN (HGB A1C): Hemoglobin A1C: 6.1

## 2014-05-13 LAB — TSH: TSH: 1.521 u[IU]/mL (ref 0.350–4.500)

## 2014-05-13 LAB — IFOBT (OCCULT BLOOD): IFOBT: NEGATIVE

## 2014-05-13 LAB — GLUCOSE, POCT (MANUAL RESULT ENTRY): POC GLUCOSE: 113 mg/dL — AB (ref 70–99)

## 2014-05-13 NOTE — Progress Notes (Addendum)
Subjective:  This chart was scribed for [name], MD by Elveria Rising, Medial Scribe. This patient was seen in room 22 and the patient's care was started at 9:39 AM.    Patient ID: Malik [name], male    DOB: 1951/03/10, 64 y.o.   MRN: [id number]  HPI HPI Comments: Malik [name] is a 64 y.o. male with PMHx of aortic stenosis, murmur, dyspnea, Hypertension, and Hyperglycemia who presents to the [facility] requesting complete physical examination. Per chart history patient, in October 2015 chest CT revealed 5mm nodule in patient's left lower lobe.  Patient has been suffering with laryngitis for the last month with non productive cough. Patient shares previous laryngoscopy years ago.  Patient states that he has also noticed urinary frequency but denies straining to urinate.   Patient Active Problem List   Diagnosis Date Noted  . Aortic stenosis, moderate 01/06/2013  . Murmur 11/17/2012  . Dyspnea 11/17/2012  . Hypertension 12/18/2011  . Hyperglycemia 12/18/2011  . Hyperlipidemia 12/18/2011  . Arthritis 12/18/2011  . Obesity 12/18/2011   Past Medical History  Diagnosis Date  . Hypertension   . Hyperglycemia   . Hyperlipidemia   . Arthritis   . Obesity    Past Surgical History  Procedure Laterality Date  . Appendectomy     No Known Allergies Prior to Admission medications   Medication Sig Start Date End Date Taking? Authorizing Provider  aspirin 325 MG EC tablet Take 325 mg by mouth daily.    Historical Provider, MD  hydrochlorothiazide (HYDRODIURIL) 25 MG tablet TAKE 1 TABLET BY MOUTH EVERY DAY 03/11/14   Collene Gobble, MD  lisinopril (PRINIVIL,ZESTRIL) 20 MG tablet TAKE 1 TABLET BY MOUTH EVERY DAY 03/11/14   Collene Gobble, MD  metoprolol succinate (TOPROL-XL) 100 MG 24 hr tablet TAKE 1 TABLET BY MOUTH ONCE DAILY WITH OR IMMEDIATELY FOLLOWING A MEAL 03/11/14   Collene Gobble, MD  simvastatin (ZOCOR) 80 MG tablet TAKE 1 TABLET BY MOUTH EVERY NIGHT AT  BEDTIME 03/11/14   Collene Gobble, MD   History   Social History  . Marital Status: Divorced    Spouse Name: N/A  . Number of Children: N/A  . Years of Education: N/A   Occupational History  . Not on file.   Social History Main Topics  . Smoking status: Former Smoker -- 20 years    Types: Cigarettes, Cigars    Quit date: 08/19/1978  . Smokeless tobacco: Not on file  . Alcohol Use: No  . Drug Use: No  . Sexual Activity: Not on file   Other Topics Concern  . Not on file   Social History Narrative     Review of Systems  Constitutional: Negative for fever and chills.  Respiratory: Positive for cough. Negative for shortness of breath.   Cardiovascular: Negative for chest pain.  Genitourinary: Positive for frequency. Negative for dysuria, urgency and hematuria.       Objective:   Physical Exam  Nursing note and vitals reviewed.   CONSTITUTIONAL: Well developed/well nourished HEAD: Normocephalic/atraumatic EYES: EOMI/PERRL ENMT: Mucous membranes moist; poor dentition  NECK: supple no meningeal signs SPINE/BACK:entire spine nontender CV: S1/S2 noted; 2/6 systolic murmur at the apex of the heart  LUNGS: Lungs are clear to auscultation bilaterally, no apparent distress ABDOMEN: soft, nontender, no rebound or guarding, bowel sounds noted throughout abdomen; markedly overweight but no masses  GU:no cva tenderness; prostate is normal size no masses  NEURO: Pt is  awake/alert/appropriate, moves all extremitiesx4.  No facial droop.   EXTREMITIES: pulses normal/equal, full ROM SKIN: warm, color normal; multiple skin tags in the upper thigh area PSYCH: no abnormalities of mood noted, alert and oriented to situation  Filed Vitals:   05/13/14 0921  BP: 132/84  Pulse: 71  Temp: 97.4 F (36.3 C)  Resp: 16  Height: 5' 11.5" (1.816 m)  Weight: 331 lb (150.141 kg)  SpO2: 93%   Results for orders placed or performed in visit on 05/13/14  POCT glycosylated hemoglobin (Hb A1C)   Result Value Ref Range   Hemoglobin A1C 6.1   POCT glucose (manual entry)  Result Value Ref Range   POC Glucose 113 (A) 70 - 99 mg/dl  IFOBT POC (occult bld, rslt in office)  Result Value Ref Range   IFOBT Negative        Assessment & Plan:   Hemoglobin A1c is up 0.4 points. He will continue to work on diet and exercise. Referral made to cardiology for follow-up of his known aortic stenosis. He stated he would make his own appointment to see Dr. Russella DarStark regarding colonoscopy. At the present time he declined referral to ENT to evaluate his hoarseness. I advised him if he is not better in 2 weeks he will have to see them. He does have a pulmonary nodule which will need follow-up in October 2016.I personally performed the services described in this documentation, which was scribed in my presence. The recorded information has been reviewed and is accurate.

## 2014-05-13 NOTE — Progress Notes (Signed)
[facility] [place]  [address]  [place] [address]  Dept: [phone]  Dept Fax: [phone]    Dustin Zimmerman is a 64 y.o. male who presents today for his medical conditions/complaints as noted below.  Dustin Zimmerman is c/o of   Chief Complaint   Patient presents with   ??? Annual Exam     WELL EXAM       Have you seen any other physician or provider since your last visit? yes - OPHTHALMOLOGIST    Have you had any other diagnostic tests since your last visit? no    Have you changed or stopped any medications since your last visit including any over-the-counter medicines, vitamins, or herbal medicines? no     Are you taking all your prescribed medications? Yes  If NO, why? -  N/A           Patient Self-Management Goal for this visit.   What is your goal for your visit today? - WELL EXAM FOR INS   Barriers to success: none   Plan for overcoming my barriers: N/A      Confidence: 10/10   Date goal set: 05/13/14   Date expected to reach goal: 1day    Medical history Review  Past Medical, Family, and Social History reviewed and does contribute to the patient presenting condition    There are no preventive care reminders to display for this patient.      HPI:     HPI Comments: Well check; some toe pain; worries about testosterone level;     Toe Pain   There was no injury mechanism. The pain is present in the left toes and right toes. The quality of the pain is described as aching. The pain is at a severity of 2/10. The pain is mild. The pain has been intermittent since onset. Pertinent negatives include no numbness or tingling. He reports no foreign bodies present. The symptoms are aggravated by palpation. He has tried nothing for the symptoms. The treatment provided no relief.   Rectal Bleeding   The current episode started more than 2 weeks ago (3 months off and on). The onset was gradual. The problem occurs occasionally. The problem has been gradually  worsening. The pain is mild. The stool is described as hard and streaked with blood. There was no prior successful therapy. There was no prior unsuccessful therapy. Associated symptoms include abdominal pain. Pertinent negatives include no fever, no diarrhea, no nausea, no vomiting, no hematuria, no chest pain and no headaches. He has been behaving normally. He has been eating and drinking normally. Urine output has been normal. The last void occurred less than 6 hours ago. Past medical history comments: hx of hemorrhoids. Sick contacts: [facility]. He has received no recent medical care.    Patient here for well exam for insurance, will need lab order.     HEMOGLOBIN A1C (%)   Date Value   02/23/2013 5.9   09/22/2012 5.4             ( goal A1C is < 7)   No results found for: LABMICR  LDL CHOLESTEROL (mg/dL)   Date Value   65/78/4696 66     LDL CALCULATED (mg/dL)   Date Value   29/52/8413 115       (goal LDL is <100)   AST (U/L)   Date Value   02/25/2013 18     ALT (  U/L)   Date Value   02/25/2013 16     BUN (mg/dL)   Date Value   16/10/960401/21/2015 24*     BP Readings from Last 3 Encounters:   05/13/14 130/86   11/29/13 133/90   11/26/13 112/74          (goal 120/80)    Past Medical History   Diagnosis Date   ??? Hypogonadism    ??? Lumbago    ??? Malaise and fatigue    ??? Myalgia    ??? Hypothyroid       Past Surgical History   Procedure Laterality Date   ??? Foot surgery       middle toe   ??? Knee surgery     ??? Shoulder surgery     ??? Eye surgery         No family history on file.    History   Substance Use Topics   ??? Smoking status: Former Smoker   ??? Smokeless tobacco: Never Used   ??? Alcohol Use: 0.0 oz/week     0 Not specified per week      Comment: occ      Current Outpatient Prescriptions   Medication Sig Dispense Refill   ??? levothyroxine (SYNTHROID) 50 MCG tablet Take 1 tablet by mouth daily 90 tablet 3   ??? triamcinolone (KENALOG) 0.1 % cream   4   ??? prednisoLONE acetate (PRED FORTE) 1 % ophthalmic suspension       ??? dorzolamide-timolol (COSOPT) 22.3-6.8 MG/ML ophthalmic solution   3     No current facility-administered medications for this visit.     Allergies   Allergen Reactions   ??? Motrin [Ibuprofen]    ??? Percocet [Oxycodone-Acetaminophen] Hives   ??? Motrin [Ibuprofen] Nausea And Vomiting     kidney   ??? Percocet [Oxycodone-Acetaminophen] Rash       Health Maintenance   Topic Date Due   ??? FLU VACCINE YEARLY (ADULT)  10/25/2014   ??? COLON CANCER SCREENING COLONOSCOPY  12/17/2022   ??? TETANUS VACCINE ADULT (11 YEARS AND UP)  09/30/2023   ??? ZOSTAVAX VACCINE  Addressed       Subjective:      Review of Systems   Constitutional: Negative for fever, activity change, appetite change and unexpected weight change.   HENT: Negative for dental problem, hearing loss and trouble swallowing.    Eyes: Negative for pain.   Respiratory: Negative for chest tightness and shortness of breath.    Cardiovascular: Negative for chest pain.   Gastrointestinal: Positive for abdominal pain and hematochezia. Negative for nausea, vomiting, diarrhea, blood in stool and abdominal distention.   Genitourinary: Negative for frequency and hematuria.   Musculoskeletal: Negative for arthralgias.   Neurological: Negative for tingling, numbness and headaches.   Psychiatric/Behavioral: Negative for decreased concentration.       Objective:     Physical Exam   Constitutional: He appears well-developed.   HENT:   Head: Normocephalic.   Eyes: Pupils are equal, round, and reactive to light.   Neck: Normal range of motion.   Cardiovascular: Normal rate.    Pulmonary/Chest: Effort normal.   Abdominal: Soft.   Musculoskeletal: Normal range of motion.   Neurological: He is alert.   Skin: Skin is warm and dry.   Psychiatric: He has a normal mood and affect.   Nursing note and vitals reviewed.    BP 130/86 mmHg   Pulse 74   Temp(Src) 97.7 ??F (36.5 ??C) (Oral)  Ht 6' (1.829 m)   Wt 199 lb (90.266 kg)   BMI 26.98 kg/m2   SpO2 97%    Assessment:      1. Rectal bleeding   COLONOSCOPY (Diagnostic)   2. Well adult exam  Comprehensive Metabolic Panel    Lipid Panel    CBC With Auto Differential    TSH    PSA Screening   3. Screening for lipoid disorders  Lipid Panel   4. Hypothyroidism, unspecified hypothyroidism type  TSH   5. Pain of toe of left foot  Uric Acid   6. Hypogonadism in male  Testosterone, Free             Plan:    Labs; podiaty consult; cont meds; check testosterone level.  No Follow-up on file.    Orders Placed This Encounter   Procedures   ??? COLONOSCOPY (Diagnostic)     Order Specific Question:  Screening or Diagnostic?     Answer:  Diagnostic   ??? Comprehensive Metabolic Panel     Standing Status: Future      Number of Occurrences:       Standing Expiration Date: 05/14/2015   ??? Lipid Panel     Standing Status: Future      Number of Occurrences:       Standing Expiration Date: 05/14/2015     Order Specific Question:  Is Patient Fasting?/# of Hours     Answer:  12   ??? CBC With Auto Differential     Standing Status: Future      Number of Occurrences:       Standing Expiration Date: 05/14/2015   ??? TSH     Standing Status: Future      Number of Occurrences:       Standing Expiration Date: 05/14/2015   ??? PSA Screening     Standing Status: Future      Number of Occurrences:       Standing Expiration Date: 05/14/2015   ??? Uric Acid     Standing Status: Future      Number of Occurrences:       Standing Expiration Date: 05/14/2015   ??? Testosterone, Free     Standing Status: Future      Number of Occurrences:       Standing Expiration Date: 05/14/2015     No orders of the defined types were placed in this encounter.       Patient given educational materials - see patient instructions.  Discussed use, benefit, and side effects of prescribed medications.  All patient questions answered. Pt voiced understanding. Reviewed health maintenance.  Instructed to continue current medications, diet and exercise.  Patient agreed with treatment plan. Follow up as directed.     Electronically signed by  Alger Memos, MD on 05/13/2014 at 12:29 PM

## 2014-05-14 LAB — PSA: PSA: 0.6 ng/mL (ref <=4.00)

## 2014-05-19 ENCOUNTER — Encounter: Payer: Self-pay | Admitting: *Deleted

## 2014-05-26 ENCOUNTER — Ambulatory Visit: Admit: 2014-05-26 | Discharge: 2014-05-26 | Payer: BLUE CROSS/BLUE SHIELD | Attending: Medical | Primary: Family

## 2014-05-26 DIAGNOSIS — J209 Acute bronchitis, unspecified: Secondary | ICD-10-CM

## 2014-05-26 MED ORDER — AZITHROMYCIN 250 MG PO TABS
250 MG | PACK | ORAL | Status: AC
Start: 2014-05-26 — End: 2014-06-05

## 2014-05-26 MED ORDER — METHYLPREDNISOLONE 4 MG PO TBPK
4 MG | PACK | ORAL | Status: AC
Start: 2014-05-26 — End: 2014-06-01

## 2014-05-26 NOTE — Progress Notes (Signed)
[place]  [place] [address]  Dept: [phone]  Dept Fax: [phone]    Dustin Zimmerman is a 64 y.o. male who presents today for his medical conditions/complaints as noted below.  Dustin Zimmerman is c/o of   Chief Complaint   Patient presents with   ??? Cough     Have you seen any other physician or provider since your last visit no    Have you had any other diagnostic tests since your last visit? no    Have you changed or stopped any medications since your last visit including any over-the-counter medicines, vitamins, or herbal medicines? no     Are you taking all your prescribed medications? Yes  If NO, why? -  N/A           Patient Self-Management Goal for this visit.   What is your goal for your visit today? - discuss cough   Barriers to success: none   Plan for overcoming my barriers: N/A      Confidence: 10/10   Date goal set: 05/26/14   Date expected to reach goal: 1day        HPI:     Cough  This is a new problem. The current episode started in the past 7 days. The problem has been gradually worsening. The problem occurs every few minutes. The cough is productive of sputum. Associated symptoms include nasal congestion, postnasal drip and rhinorrhea. Pertinent negatives include no chest pain, ear pain, fever, rash, sore throat, shortness of breath, sweats or wheezing. Nothing aggravates the symptoms. He has tried OTC cough suppressant for the symptoms. The treatment provided mild relief.   patient states OTC medications not helping. No fevers. Little sinus drainage. Has been going on for the last week.     HEMOGLOBIN A1C (%)   Date Value   02/23/2013 5.9   09/22/2012 5.4             ( goal A1C is < 7)   No results found for: LABMICR  LDL CHOLESTEROL (mg/dL)   Date Value   02/24/2013 66     LDL CALCULATED (mg/dL)   Date Value   06/12/2013 115       (goal LDL is <100)   AST (U/L)   Date Value   02/25/2013 18     ALT (U/L)   Date  Value   02/25/2013 16     BUN (mg/dL)   Date Value   04/15/2013 24*     BP Readings from Last 3 Encounters:   05/26/14 110/62   05/13/14 130/86   11/29/13 133/90          (goal 120/80)    Past Medical History   Diagnosis Date   ??? Hypogonadism    ??? Lumbago    ??? Malaise and fatigue    ??? Myalgia    ??? Hypothyroid       Past Surgical History   Procedure Laterality Date   ??? Foot surgery       middle toe   ??? Knee surgery     ??? Shoulder surgery     ??? Eye surgery         History reviewed. No pertinent family history.    History   Substance Use Topics   ??? Smoking status: Former Smoker   ??? Smokeless tobacco: Never Used   ??? Alcohol Use: 0.0 oz/week  0 Not specified per week      Comment: occ      Current Outpatient Prescriptions   Medication Sig Dispense Refill   ??? azithromycin (ZITHROMAX) 250 MG tablet 2 tablets now then 1 daily until gone. 1 packet 0   ??? methylPREDNISolone (MEDROL, PAK,) 4 MG tablet Take as directed 1 kit 0   ??? dorzolamide-timolol (COSOPT) 22.3-6.8 MG/ML ophthalmic solution   3   ??? levothyroxine (SYNTHROID) 50 MCG tablet Take 1 tablet by mouth daily 90 tablet 3   ??? triamcinolone (KENALOG) 0.1 % cream   4   ??? prednisoLONE acetate (PRED FORTE) 1 % ophthalmic suspension        No current facility-administered medications for this visit.     Allergies   Allergen Reactions   ??? Motrin [Ibuprofen]    ??? Percocet [Oxycodone-Acetaminophen] Hives   ??? Motrin [Ibuprofen] Nausea And Vomiting     kidney   ??? Percocet [Oxycodone-Acetaminophen] Rash       Health Maintenance   Topic Date Due   ??? FLU VACCINE YEARLY (ADULT)  10/25/2014   ??? COLON CANCER SCREENING COLONOSCOPY  12/17/2022   ??? TETANUS VACCINE ADULT (11 YEARS AND UP)  09/30/2023   ??? ZOSTAVAX VACCINE  Addressed       Subjective:      Review of Systems   Constitutional: Negative for fever, activity change, appetite change and fatigue.        BP 110/62 mmHg   Pulse 67   Temp(Src) 97.6 ??F (36.4 ??C) (Oral)   Ht 6' (1.829 m)   Wt 199 lb (90.266 kg)   BMI 26.98 kg/m2    SpO2 98%   HENT: Positive for postnasal drip and rhinorrhea. Negative for congestion, ear discharge, ear pain, sinus pressure and sore throat.    Respiratory: Positive for cough. Negative for shortness of breath and wheezing.    Cardiovascular: Negative for chest pain and palpitations.   Gastrointestinal: Negative for nausea, vomiting, abdominal pain, diarrhea and constipation.   Genitourinary: Negative for dysuria.   Skin: Negative for color change, pallor, rash and wound.   Neurological: Negative for weakness.   Hematological: Negative for adenopathy.   Psychiatric/Behavioral: The patient is not nervous/anxious.        Objective:     Physical Exam   Constitutional: He is oriented to person, place, and time. He appears well-developed and well-nourished.   HENT:   Head: Normocephalic and atraumatic.   Right Ear: Tympanic membrane, external ear and ear canal normal.   Left Ear: Tympanic membrane, external ear and ear canal normal.   Nose: No rhinorrhea. Right sinus exhibits no maxillary sinus tenderness and no frontal sinus tenderness. Left sinus exhibits no maxillary sinus tenderness and no frontal sinus tenderness.   Mouth/Throat: Oropharynx is clear and moist. No oropharyngeal exudate, posterior oropharyngeal edema or posterior oropharyngeal erythema.   Neck: Neck supple.   Cardiovascular: Normal rate, regular rhythm and normal heart sounds.    No murmur heard.  Pulmonary/Chest: Effort normal. No respiratory distress. He has no wheezes. He has no rales (rhonchi bilateral lower lobes, scattered, good air flow).   Neurological: He is alert and oriented to person, place, and time.   Skin: Skin is warm and dry.   Psychiatric: He has a normal mood and affect. His behavior is normal. Judgment and thought content normal.   Nursing note and vitals reviewed.    BP 110/62 mmHg   Pulse 67   Temp(Src) 97.6 ??F (36.4 ??C) (  Oral)   Ht 6' (1.829 m)   Wt 199 lb (90.266 kg)   BMI 26.98 kg/m2   SpO2 98%    Assessment:      1. Acute  bronchitis, unspecified organism  azithromycin (ZITHROMAX) 250 MG tablet    methylPREDNISolone (MEDROL, PAK,) 4 MG tablet             Plan:      Return if symptoms worsen or fail to improve.    Stop OTC medication  Start antibiotic and steroids  Encouraged fluids and rest  Patient agreed with tmreantplna  Health Maintenance reviewed.      Orders Placed This Encounter   Medications   ??? azithromycin (ZITHROMAX) 250 MG tablet     Sig: 2 tablets now then 1 daily until gone.     Dispense:  1 packet     Refill:  0   ??? methylPREDNISolone (MEDROL, PAK,) 4 MG tablet     Sig: Take as directed     Dispense:  1 kit     Refill:  0       Patient given educational materials - see patient instructions.  Discussed use, benefit, and side effects of prescribed medications.  All patient questions answered. Pt voiced understanding. Reviewed health maintenance.  Instructed to continue current medications, diet and exercise.  Patient agreed with treatment plan. Follow up as directed.     Electronically signed by Prince Solian, PA-C on 05/26/2014 at 10:39 AM

## 2014-05-27 ENCOUNTER — Encounter: Attending: Family Medicine | Primary: Family

## 2014-06-04 ENCOUNTER — Encounter: Attending: Family Medicine | Primary: Family

## 2014-06-11 ENCOUNTER — Telehealth

## 2014-06-11 NOTE — Telephone Encounter (Signed)
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    Cbc w-diff  Vit d 25 hydroxy  cmp  Lipid  hga1c  hscrp  Homocysteine  Total testosterone  Free testosterone  shbg  Total estrogen  Estradiol  phea-s  tsh  Free t3  Free t4      Pt knows that he may have to pay for some of these labs and is okay with that. I wasn't sure what dx code to use for these.

## 2014-06-13 NOTE — Progress Notes (Signed)
Patient ID: Malik [name], male   DOB: [birth year], 64 y.o.   MRN: [id number]     Cardiology Office Note   Date:  06/13/2014   ID:  Malik [name], Malik [name] 1951-03-13, MRN [id number]  PCP:  Lucilla Edin, MD  Cardiologist:   [name], MD   No chief complaint on file.     History of Present Illness: Malik [name] is a 64 y.o. male who presents for evaluation of aortic stenosis Last echo from 9/14 reviewed mean gradient 20 mmHg and peak 31 mmHg Study Conclusions  - Left ventricle: The cavity size was normal. Wall thickness was increased in a pattern of mild LVH. Systolic function was normal. The estimated ejection fraction was in the range of 55% to 60%. Wall motion was normal; there were no regional wall motion abnormalities. Features are consistent with a pseudonormal left ventricular filling pattern, with concomitant abnormal relaxation and increased filling pressure (grade 2 diastolic dysfunction). - Aortic valve: There was mild to moderate stenosis. - Left atrium: The atrium was mildly dilated.  Also reviewed ETT 12/09/12  Normal went 12 minutes on Bruce protoco  Has not been seen since then  Still overweight  Poor diet Using elliptical  Retired From fire department  Denies chest pain, palpitations syncope    Past Medical History  Diagnosis Date  . Hypertension   . Hyperglycemia   . Hyperlipidemia   . Arthritis   . Obesity   . Aortic stenosis     Past Surgical History  Procedure Laterality Date  . Appendectomy       Current Outpatient Prescriptions  Medication Sig Dispense Refill  . aspirin 325 MG EC tablet Take 325 mg by mouth daily.    . hydrochlorothiazide (HYDRODIURIL) 25 MG tablet TAKE 1 TABLET BY MOUTH EVERY DAY 90 tablet 0  . lisinopril (PRINIVIL,ZESTRIL) 20 MG tablet TAKE 1 TABLET BY MOUTH EVERY DAY 90 tablet 0  . metoprolol succinate (TOPROL-XL) 100 MG 24 hr tablet TAKE 1 TABLET BY MOUTH ONCE DAILY WITH OR IMMEDIATELY FOLLOWING A  MEAL 90 tablet 0  . simvastatin (ZOCOR) 80 MG tablet TAKE 1 TABLET BY MOUTH EVERY NIGHT AT BEDTIME 90 tablet 0   No current facility-administered medications for this visit.    Allergies:   Review of patient's allergies indicates no known allergies.    Social History:  The patient  reports that he quit smoking about 35 years ago. His smoking use included Cigarettes and Cigars. He quit after 20 years of use. He does not have any smokeless tobacco history on file. He reports that he does not drink alcohol or use illicit drugs.   Family History:  The patient's family history includes Cancer in his brother and mother; Heart disease in his brother and father.    ROS:  Please see the history of present illness.   Otherwise, review of systems are positive for none.   All other systems are reviewed and negative.    PHYSICAL EXAM: Affect appropriate Overweight white male  HEENT: normal Neck supple with no adenopathy JVP normal no bruits no thyromegaly Lungs clear with no wheezing and good diaphragmatic motion Heart:  S1/S2 preserved AS  murmur, no rub, gallop or click PMI normal Abdomen: benighn, BS positve, no tenderness, no AAA no bruit.  No HSM or HJR Distal pulses intact with no bruits Plus one bilateral edema Neuro non-focal Skin warm and dry No muscular weakness    EKG:  11/17/12  SR rate 80  normal ECG  06/14/14  SR rate 71 normal no signficant change    Recent Labs: 05/13/2014: ALT 37; BUN 11; Creatinine 0.95; Hemoglobin 15.8; Platelets 178; Potassium 4.0; Sodium 140; TSH 1.521    Lipid Panel    Component Value Date/Time   CHOL 129 05/13/2014 1031   TRIG 130 05/13/2014 1031   HDL 26* 05/13/2014 1031   CHOLHDL 5.0 05/13/2014 1031   VLDL 26 05/13/2014 1031   LDLCALC 77 05/13/2014 1031      Wt Readings from Last 3 Encounters:  05/13/14 331 lb (150.141 kg)  01/12/14 330 lb (149.687 kg)  09/08/13 330 lb 9.6 oz (149.959 kg)      Other studies Reviewed: Additional  studies/ records that were reviewed today include: [facility] records and Epic records 2014 see HPI.    ASSESSMENT AND PLAN:  1.  AS  F/u echo does not sound severe on exam   2. Obesity  Discussed exercise and low carb diet   Current medicines are reviewed at length with the patient today.  The patient does not have concerns regarding medicines.  The following changes have been made:  no change  Labs/ tests ordered today include:  Echo   No orders of the defined types were placed in this encounter.     Disposition:   FU with one year      Signed, Charlton HawsPeter Donneisha Beane, MD  06/13/2014 12:20 PM    [facility] [address], [place], [place]  [address] Phone: [phone]; Fax: [phone]

## 2014-06-14 ENCOUNTER — Encounter: Payer: Self-pay | Admitting: Cardiovascular Disease

## 2014-06-14 ENCOUNTER — Ambulatory Visit (INDEPENDENT_AMBULATORY_CARE_PROVIDER_SITE_OTHER): Payer: 59 | Admitting: Cardiovascular Disease

## 2014-06-14 VITALS — BP 110/74 | HR 71 | Ht 71.0 in | Wt 330.4 lb

## 2014-06-14 DIAGNOSIS — I10 Essential (primary) hypertension: Secondary | ICD-10-CM

## 2014-06-14 DIAGNOSIS — I350 Nonrheumatic aortic (valve) stenosis: Secondary | ICD-10-CM

## 2014-06-14 NOTE — Patient Instructions (Signed)

## 2014-06-15 NOTE — Telephone Encounter (Signed)
Ok for labs; use "screening" for diagnosis

## 2014-06-17 ENCOUNTER — Other Ambulatory Visit (HOSPITAL_COMMUNITY): Payer: 59 | Admitting: *Deleted

## 2014-06-17 ENCOUNTER — Ambulatory Visit (HOSPITAL_COMMUNITY): Payer: 59 | Attending: Cardiology | Admitting: Radiology

## 2014-06-17 DIAGNOSIS — I10 Essential (primary) hypertension: Secondary | ICD-10-CM | POA: Diagnosis not present

## 2014-06-17 DIAGNOSIS — E785 Hyperlipidemia, unspecified: Secondary | ICD-10-CM | POA: Diagnosis not present

## 2014-06-17 DIAGNOSIS — E669 Obesity, unspecified: Secondary | ICD-10-CM | POA: Diagnosis not present

## 2014-06-17 DIAGNOSIS — I350 Nonrheumatic aortic (valve) stenosis: Secondary | ICD-10-CM

## 2014-06-17 DIAGNOSIS — Z87891 Personal history of nicotine dependence: Secondary | ICD-10-CM | POA: Diagnosis not present

## 2014-06-17 MED ORDER — PERFLUTREN LIPID MICROSPHERE
1.0000 mL | INTRAVENOUS | Status: AC | PRN
  Administered 2014-06-17: 4 mL via INTRAVENOUS

## 2014-06-17 NOTE — Progress Notes (Signed)
Echocardiogram performed with Definity.  

## 2014-06-17 NOTE — Telephone Encounter (Signed)
Order made for all labs pending except "shbg and hscrp" because they are not coming up in the system. I called the patient and LM to call me back to clarify those couple of tests (maybe a letter was mistaken for another one) that way i can get the rest of the labs ordered and then print the entire order.

## 2014-06-24 ENCOUNTER — Encounter: Attending: Family Medicine | Primary: Family

## 2014-06-28 ENCOUNTER — Telehealth: Payer: Self-pay | Admitting: Cardiovascular Disease

## 2014-06-28 DIAGNOSIS — I350 Nonrheumatic aortic (valve) stenosis: Secondary | ICD-10-CM

## 2014-06-28 NOTE — Telephone Encounter (Signed)
All lab orders completed and given to patient.

## 2014-06-29 NOTE — Telephone Encounter (Signed)
Informed pt of echo results and to repeat study in 1 year. Pt verbalized understanding and was in agreement with this plan.

## 2014-06-29 NOTE — Telephone Encounter (Signed)
New message      Returning a nurses call to get echo results.  He will only be at home for another 15 minutes.

## 2014-06-30 ENCOUNTER — Other Ambulatory Visit: Payer: Self-pay | Admitting: Emergency Medicine

## 2014-07-02 LAB — CBC WITH AUTO DIFFERENTIAL

## 2014-07-02 LAB — VITAMIN D 25 HYDROXY: Vit D, 25-Hydroxy: 35

## 2014-07-02 LAB — HEMOGLOBIN A1C: Hemoglobin A1C: 5.7 %

## 2014-07-02 LAB — SEX HORMONE BINDING GLOBULIN: Sex Hormone Binding: 54.3

## 2014-07-02 LAB — T4, FREE: T4 Free: 0.87

## 2014-07-02 LAB — FERRITIN: Ferritin: 192 ng/mL (ref 18.0–300.0)

## 2014-07-02 LAB — TESTOSTERONE: Testosterone: 377

## 2014-07-02 LAB — PSA SCREENING: PSA: 0.69 ng/mL

## 2014-07-02 LAB — TSH: TSH: 2.64 u[IU]/mL

## 2014-07-02 LAB — T3, FREE: T3, Free: 2.41

## 2014-08-19 ENCOUNTER — Ambulatory Visit: Admit: 2014-08-19 | Discharge: 2014-08-19 | Payer: BLUE CROSS/BLUE SHIELD | Attending: Family Medicine | Primary: Family

## 2014-08-19 DIAGNOSIS — R911 Solitary pulmonary nodule: Secondary | ICD-10-CM

## 2014-08-19 MED ORDER — TADALAFIL 20 MG PO TABS
20 | ORAL_TABLET | ORAL | Status: DC | PRN
Start: 2014-08-19 — End: 2017-04-17

## 2014-08-19 NOTE — Progress Notes (Signed)
[facility]  [address]  [place] [address]  Dept: [phone]  Dept Fax: [phone]    Dustin Zimmerman is a 64 y.o. male who presents today for his medical conditions/complaints as noted below.  Dustin Zimmerman is c/o of   Chief Complaint   Patient presents with   ??? Rash       Have you changed or stopped any medications since your last visit including any over-the-counter medicines, vitamins, or herbal medicines? no     Are you taking all your prescribed medications? Yes          If NO, why? -  N/A    Have you seen any other physician or provider since your last visit no    Have you had any other diagnostic tests since your last visit? yes - LABS    Tobacco use:  Patient  reports that he has quit smoking. He has never used smokeless tobacco.   If a smoker, cessation materials provided? NA   1-800-QUIT-NOW [phone])     Medical history Review  Past Medical, Family, and Social History reviewed and does contribute to the patient presenting condition    There are no preventive care reminders to display for this patient.      HPI:     Rash  This is a recurrent problem. The current episode started more than 1 month ago. The problem has been gradually worsening since onset. The affected locations include the back, left arm and right arm. The rash is characterized by itchiness, redness, blistering, dryness and scaling. He was exposed to nothing. Past treatments include nothing. The treatment provided no relief.       HEMOGLOBIN A1C (%)   Date Value   02/23/2013 5.9   09/22/2012 5.4             ( goal A1C is < 7)   No results found for: LABMICR  LDL CHOLESTEROL (mg/dL)   Date Value   28/41/3244 66     LDL CALCULATED (mg/dL)   Date Value   03/28/7251 115       (goal LDL is <100)   AST (U/L)   Date Value   02/25/2013 18     ALT (U/L)   Date Value   02/25/2013 16     BUN (mg/dL)   Date Value   66/44/0347 24*     BP Readings from Last 3 Encounters:    08/19/14 118/84   05/26/14 110/62   05/13/14 130/86          (goal 120/80)    Past Medical History   Diagnosis Date   ??? Hypogonadism    ??? Lumbago    ??? Malaise and fatigue    ??? Myalgia    ??? Hypothyroid       Past Surgical History   Procedure Laterality Date   ??? Foot surgery       middle toe   ??? Knee surgery     ??? Shoulder surgery     ??? Eye surgery         No family history on file.    History   Substance Use Topics   ??? Smoking status: Former Smoker   ??? Smokeless tobacco: Never Used   ??? Alcohol Use: 0.0 oz/week     0 Not specified per week      Comment: occ      Current Outpatient Prescriptions   Medication  Sig Dispense Refill   ??? tadalafil (CIALIS) 20 MG tablet Take 1 tablet by mouth as needed for Erectile Dysfunction 15 tablet 3   ??? dorzolamide-timolol (COSOPT) 22.3-6.8 MG/ML ophthalmic solution   3   ??? levothyroxine (SYNTHROID) 50 MCG tablet Take 1 tablet by mouth daily 90 tablet 3   ??? triamcinolone (KENALOG) 0.1 % cream   4   ??? prednisoLONE acetate (PRED FORTE) 1 % ophthalmic suspension        No current facility-administered medications for this visit.     Allergies   Allergen Reactions   ??? Motrin [Ibuprofen]    ??? Percocet [Oxycodone-Acetaminophen] Hives   ??? Motrin [Ibuprofen] Nausea And Vomiting     kidney   ??? Percocet [Oxycodone-Acetaminophen] Rash       Health Maintenance   Topic Date Due   ??? FLU VACCINE YEARLY (ADULT)  10/25/2014   ??? DIABETES SCREENING  02/24/2016   ??? LIPIDS  06/13/2018   ??? COLON CANCER SCREENING COLONOSCOPY  12/17/2022   ??? TETANUS VACCINE ADULT (11 YEARS AND UP)  09/30/2023   ??? ZOSTAVAX VACCINE  Addressed   ??? HEPATITIS  C SCREENING  Addressed   ??? HIV SCREENING  Addressed       Subjective:      Review of Systems   Skin: Positive for rash.       Objective:     Physical Exam  BP 118/84 mmHg   Pulse 84   Temp(Src) 97.9 ??F (36.6 ??C) (Oral)   Ht 6' (1.829 m)   Wt 196 lb (88.905 kg)   BMI 26.58 kg/m2   SpO2 98%    Assessment:      1. Pulmonary nodule  CT Chest W Contrast   2. Acquired  hypothyroidism               Plan:    follow up chest ct; monitor labs; rf cialis; pe ok today  No Follow-up on file.    Orders Placed This Encounter   Procedures   ??? CT Chest W Contrast     Standing Status: Future      Number of Occurrences:       Standing Expiration Date: 08/19/2015     Order Specific Question:  Reason for exam:     Answer:  follow up of calcified noduule seen on 03/2013 scan; study for stability     Orders Placed This Encounter   Medications   ??? tadalafil (CIALIS) 20 MG tablet     Sig: Take 1 tablet by mouth as needed for Erectile Dysfunction     Dispense:  15 tablet     Refill:  3       Patient given educational materials - see patient instructions.  Discussed use, benefit, and side effects of prescribed medications.  All patient questions answered. Pt voiced understanding. Reviewed health maintenance.  Instructed to continue current medications, diet and exercise.  Patient agreed with treatment plan. Follow up as directed.     Electronically signed by Alger Memosobert R Jalal Rauch, MD on 08/20/2014 at 6:25 PM

## 2014-08-20 NOTE — Telephone Encounter (Signed)
Patient is having some kind of allergic reaction and has a rash around his eyes mostly on forehead. What should he do is there a cream he can put on it  Please call him back  [phone]

## 2014-09-14 ENCOUNTER — Ambulatory Visit (INDEPENDENT_AMBULATORY_CARE_PROVIDER_SITE_OTHER): Payer: 59 | Admitting: Emergency Medicine

## 2014-09-14 ENCOUNTER — Encounter: Payer: Self-pay | Admitting: Emergency Medicine

## 2014-09-14 VITALS — BP 110/72 | HR 63 | Temp 97.6°F | Resp 16 | Ht 71.25 in | Wt 325.6 lb

## 2014-09-14 DIAGNOSIS — I10 Essential (primary) hypertension: Secondary | ICD-10-CM

## 2014-09-14 DIAGNOSIS — E785 Hyperlipidemia, unspecified: Secondary | ICD-10-CM | POA: Diagnosis not present

## 2014-09-14 DIAGNOSIS — R739 Hyperglycemia, unspecified: Secondary | ICD-10-CM | POA: Diagnosis not present

## 2014-09-14 LAB — BASIC METABOLIC PANEL WITH GFR
BUN: 11 mg/dL (ref 6–23)
CO2: 30 mEq/L (ref 19–32)
Calcium: 9.7 mg/dL (ref 8.4–10.5)
Chloride: 102 mEq/L (ref 96–112)
Creat: 0.97 mg/dL (ref 0.50–1.35)
GFR, Est Non African American: 82 mL/min
GLUCOSE: 101 mg/dL — AB (ref 70–99)
Potassium: 4.4 mEq/L (ref 3.5–5.3)
Sodium: 141 mEq/L (ref 135–145)

## 2014-09-14 LAB — LIPID PANEL
CHOLESTEROL: 132 mg/dL (ref 0–200)
HDL: 26 mg/dL — ABNORMAL LOW (ref >=40)
LDL Cholesterol: 83 mg/dL (ref 0–99)
TRIGLYCERIDES: 115 mg/dL (ref <150)
Total CHOL/HDL Ratio: 5.1 Ratio
VLDL: 23 mg/dL (ref 0–40)

## 2014-09-14 LAB — GLUCOSE, POCT (MANUAL RESULT ENTRY): POC Glucose: 97 mg/dl (ref 70–99)

## 2014-09-14 LAB — POCT GLYCOSYLATED HEMOGLOBIN (HGB A1C): Hemoglobin A1C: 6.1

## 2014-09-14 MED ORDER — SIMVASTATIN 80 MG PO TABS: 80.0000 mg | ORAL_TABLET | Freq: Every day | ORAL | Status: DC

## 2014-09-14 MED ORDER — HYDROCHLOROTHIAZIDE 25 MG PO TABS: 25.0000 mg | ORAL_TABLET | Freq: Every day | ORAL | Status: DC

## 2014-09-14 MED ORDER — METOPROLOL SUCCINATE ER 100 MG PO TB24: ORAL_TABLET | ORAL | Status: DC

## 2014-09-14 MED ORDER — LISINOPRIL 20 MG PO TABS: 20.0000 mg | ORAL_TABLET | Freq: Every day | ORAL | Status: DC

## 2014-09-14 NOTE — Progress Notes (Signed)
   Subjective:    Patient ID: Malik [name], male    DOB: 1950-10-20, 64 y.o.   MRN: [id number] This chart was scribed for [name], MD by [name], Medical Scribe. This patient was seen in room 21 and the patient's care was started at 8:13 AM.   HPI HPI Comments: Malik [name] is a 64 y.o. male with a hx of obesity, aortic stenosis, hypertension, hyperlipidemia and hyperglycemia who presents to the [facility] for a follow-up. Patient does exercise at a gym in the mornings, usually doing the elliptical machine. He has been trying to lose weight and has been watching his diet.  Review of Systems     Objective:   Physical Exam CONSTITUTIONAL: Well developed. Overweight, but in no distress. HEAD: Normocephalic/atraumatic EYES: EOM/PERRL ENMT: Mucous membranes moist NECK: supple no meningeal signs. No carotid bruit. SPINE: entire spine nontender CV: Grade 1 systolic murmur, left sternal border LUNGS: Lungs are clear to auscultation bilaterally, no apparent distress ABDOMEN: Obese, no masses GU: no cva tenderness NEURO: Pt is awake/alert, moves all extremitiesx4 EXTREMITIES: 1+ edema lower extremities. Arthritic changes of both knees, worse on the left SKIN: warm, color normal PSYCH: no abnormalities of mood noted.this Results for orders placed or performed in visit on 09/14/14  POCT glycosylated hemoglobin (Hb A1C)  Result Value Ref Range   Hemoglobin A1C 6.1   POCT glucose (manual entry)  Result Value Ref Range   POC Glucose 97 70 - 99 mg/dl      Assessment & Plan:  1. Hyperlipidemia He will continue his statin for now. - Lipid panel  2. Hyperglycemia Hemoglobin A1c at goal at 6.1 he will continue his exercise program - POCT glycosylated hemoglobin (Hb A1C) - POCT glucose (manual entry)  3. Essential hypertension Blood pressure goal 110/72 no changes. - BASIC METABOLIC PANEL WITH GFR   I personally performed the services described in  this documentation, which was scribed in my presence. The recorded information has been reviewed and is accurate.  [name], MD  [facility]  09/14/2014 9:02 AM

## 2014-09-22 ENCOUNTER — Encounter: Payer: Self-pay | Admitting: Radiology

## 2014-09-30 ENCOUNTER — Telehealth: Payer: Self-pay | Admitting: *Deleted

## 2014-09-30 NOTE — Telephone Encounter (Signed)
Pt was called regarding lab results.  Pt understood

## 2014-10-01 NOTE — Telephone Encounter (Signed)
Be sure he has been notified about his lab results. It looks like he has. Labs are good except for a low HDL related to genetics and weight and he is already working on this.

## 2014-10-04 ENCOUNTER — Encounter

## 2014-10-10 IMAGING — CR DG HAND COMPLETE 3+V*R*
2 series · 2 of 2 positions shown · non-contrast
Comparison: None.

CLINICAL DATA: History of nodular area at the base of thumb..
History of pain in the area of the base of the thumb..

RIGHT HAND - COMPLETE 3+ VIEW

[PA]
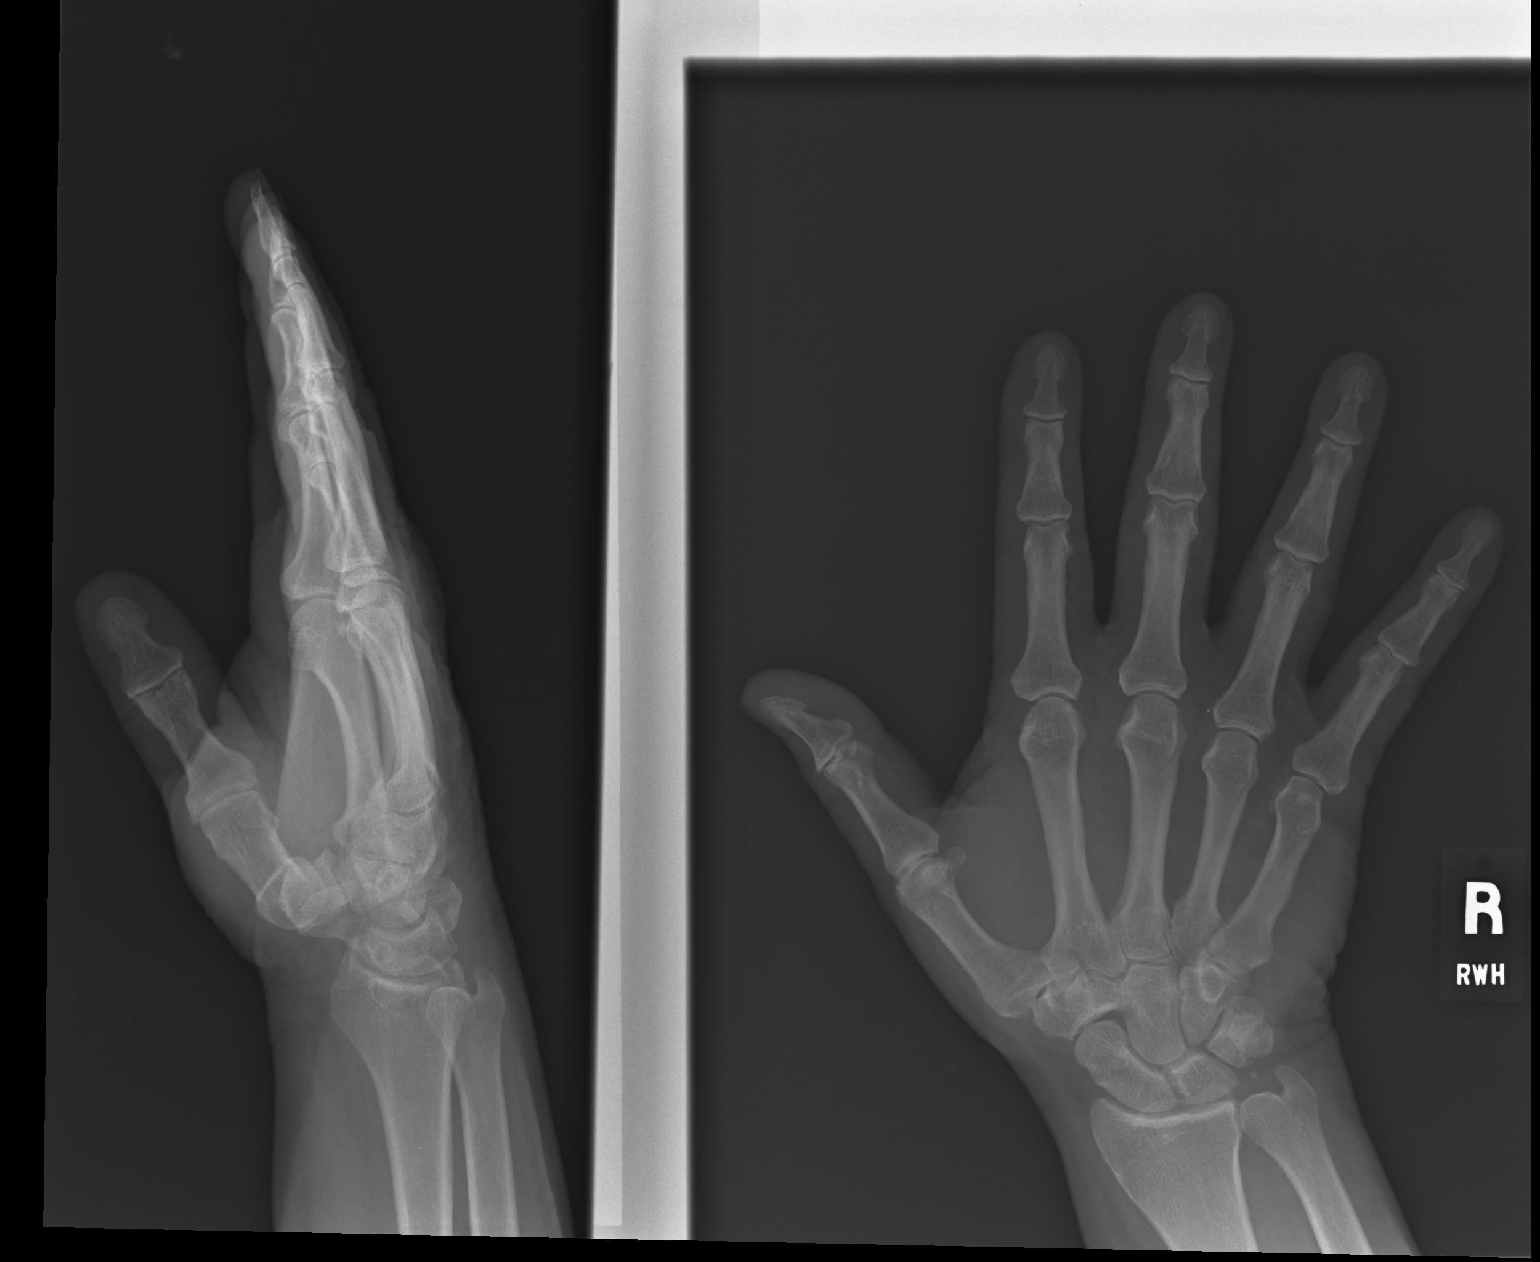

[oblique]
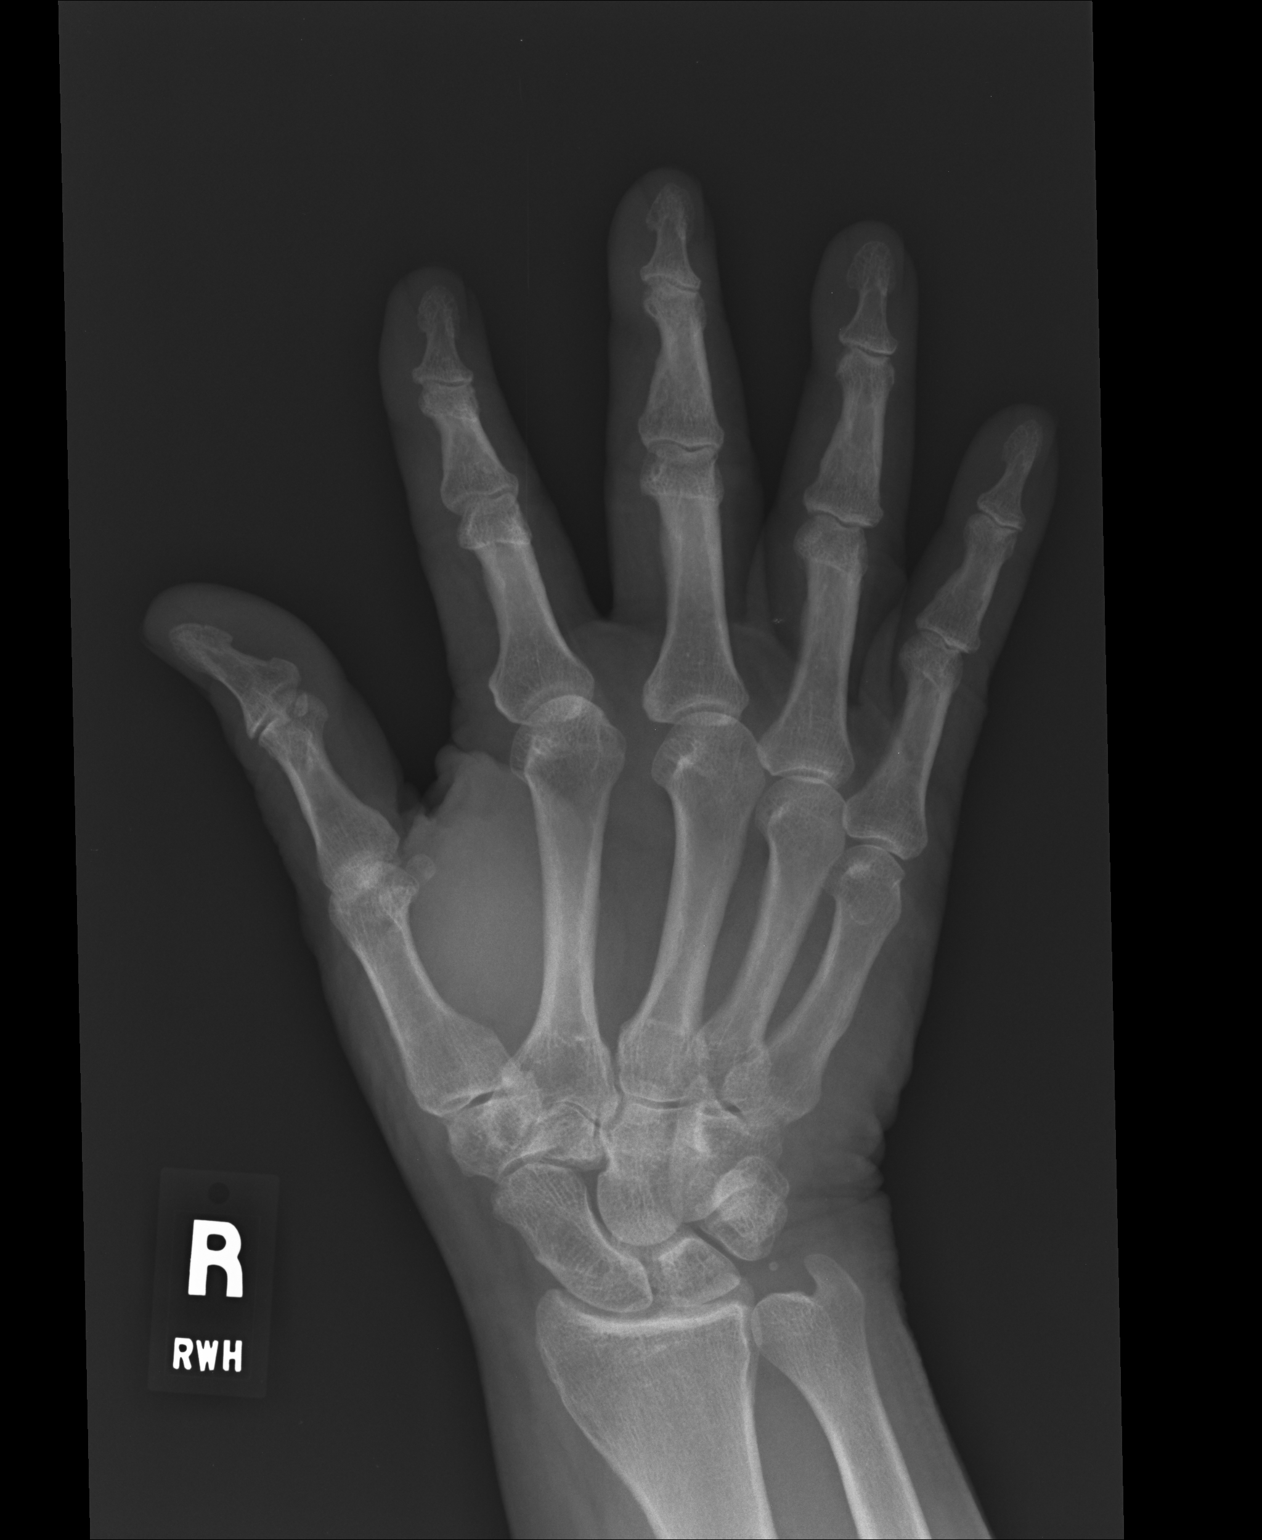

[2 of 2 positions shown; findings below may reference images not displayed]

FINDINGS: There is no evidence of fracture, dislocation, or erosive
change.  There is very slight beginning spurring developing
involving the distal phalanx at the IP joint of the thumb and
proximal aspect of the proximal phalanx at the first MCP joint...
No chondrocalcinosis is evident.  Small sesamoid bones at the first
MTP joint are seen and appear normal.
IMPRESSION: The beginning of very minimal spurring is seen at the IP and first
MCP joints.  No erosive change, fracture, or bony destruction is
evident..

## 2014-11-19 NOTE — Telephone Encounter (Signed)
PT is requesting a muscle relaxer and pain medication for lower back because he woke up with it inflammed. This is to be sent to [facility] in [place].  Please advise. Thank you

## 2014-11-23 MED ORDER — NAPROXEN 375 MG PO TABS
375 MG | ORAL_TABLET | Freq: Two times a day (BID) | ORAL | 0 refills | Status: DC
Start: 2014-11-23 — End: 2016-06-21

## 2014-11-23 MED ORDER — CYCLOBENZAPRINE HCL 10 MG PO TABS
10 MG | ORAL_TABLET | Freq: Three times a day (TID) | ORAL | 0 refills | Status: AC | PRN
Start: 2014-11-23 — End: 2014-12-03

## 2014-11-23 NOTE — Telephone Encounter (Signed)
rx sent in

## 2014-12-02 MED ORDER — DORZOLAMIDE HCL-TIMOLOL MAL 22.3-6.8 MG/ML OP SOLN
Freq: Two times a day (BID) | OPHTHALMIC | 3 refills | Status: AC
Start: 2014-12-02 — End: ?

## 2014-12-21 ENCOUNTER — Encounter: Payer: Self-pay | Admitting: Emergency Medicine

## 2014-12-21 ENCOUNTER — Ambulatory Visit (INDEPENDENT_AMBULATORY_CARE_PROVIDER_SITE_OTHER): Payer: Commercial Managed Care - HMO | Admitting: Emergency Medicine

## 2014-12-21 VITALS — BP 132/79 | HR 69 | Temp 98.5°F | Resp 16 | Ht 71.0 in | Wt 326.0 lb

## 2014-12-21 DIAGNOSIS — Z23 Encounter for immunization: Secondary | ICD-10-CM

## 2014-12-21 DIAGNOSIS — R739 Hyperglycemia, unspecified: Secondary | ICD-10-CM

## 2014-12-21 LAB — POCT GLYCOSYLATED HEMOGLOBIN (HGB A1C): Hemoglobin A1C: 6

## 2014-12-21 LAB — GLUCOSE, POCT (MANUAL RESULT ENTRY): POC Glucose: 120 mg/dl — AB (ref 70–99)

## 2014-12-21 NOTE — Progress Notes (Addendum)
Patient ID: Malik [name], male   DOB: 1951-01-18, 64 y.o.   MRN: [id number]     This chart was scribed for [name], MD by [name], Medical Scribe. This patient was seen in room 21 and the patient's care was started at 9:21 AM.   Chief Complaint:  Chief Complaint  Patient presents with  . Diabetes    HPI: Malik [name] is a 64 y.o. male with a history of hyperglycemia, hypertension, hyperlipidemia, and obesity who reports to [facility] today for a follow-up. Patient has been doing well overall. He has been exercising in the gym. He has been seeing his cardiologist, Dr. Eden Emms, once a year. Patient denies chest pain.  He states he has been having URI symptoms for the past two weeks.  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  Past Medical History  Diagnosis Date  . Hypertension   . Hyperglycemia   . Hyperlipidemia   . Arthritis   . Obesity   . Aortic stenosis    Past Surgical History  Procedure Laterality Date  . Appendectomy     Social History   Social History  . Marital Status: Divorced    Spouse Name: N/A  . Number of Children: N/A  . Years of Education: N/A   Social History Main Topics  . Smoking status: Former Smoker -- 20 years    Types: Cigarettes, Cigars    Quit date: 08/19/1978  . Smokeless tobacco: None  . Alcohol Use: No  . Drug Use: No  . Sexual Activity: Not Asked   Other Topics Concern  . None   Social History Narrative   Family History  Problem Relation Age of Onset  . Cancer Mother   . Heart disease Father   . Heart disease Brother   . Cancer Brother     throat cancer   No Known Allergies Prior to Admission medications   Medication Sig Start Date End Date Taking? Authorizing Provider  aspirin 325 MG EC tablet Take 325 mg by mouth daily.   Yes Historical Provider, MD  hydrochlorothiazide (HYDRODIURIL) 25 MG tablet Take 1 tablet (25 mg total) by mouth daily. 09/14/14  Yes Collene Gobble, MD  lisinopril  (PRINIVIL,ZESTRIL) 20 MG tablet Take 1 tablet (20 mg total) by mouth daily. 09/14/14  Yes Collene Gobble, MD  metoprolol succinate (TOPROL-XL) 100 MG 24 hr tablet Take 1 tablet every day 09/14/14  Yes Collene Gobble, MD     ROS: The patient denies fevers, chills, night sweats, unintentional weight loss, chest pain, palpitations, wheezing, dyspnea on exertion, nausea, vomiting, abdominal pain, dysuria, hematuria, melena, numbness, weakness, or tingling.   All other systems have been reviewed and were otherwise negative with the exception of those mentioned in the HPI and as above.    PHYSICAL EXAM: Filed Vitals:   12/21/14 0910  BP: 132/79  Pulse: 69  Temp: 98.5 F (36.9 C)  Resp: 16   Body mass index is 45.49 kg/(m^2).   General: Alert, no acute distress HEENT:  Normocephalic, atraumatic, oropharynx patent. Eye: Nonie Hoyer [facility] Cardiovascular:  Regular rate and rhythm, grade 2 systolic murmur in the aortic area with radiation to the neck. or gallops.  No Carotid bruits, radial pulse intact. No pedal edema.  Respiratory: Clear to auscultation bilaterally.  No wheezes, rales, or rhonchi.  No cyanosis, no use of accessory musculature Abdominal: No organomegaly, abdomen is soft and non-tender, positive bowel sounds.  No masses. Musculoskeletal: Gait intact.  No edema, tenderness Skin: No rashes. Neurologic: Facial musculature symmetric. Psychiatric: Patient acts appropriately throughout our interaction. Lymphatic: No cervical or submandibular lymphadenopathy    LABS: Results for orders placed or performed in visit on 12/21/14  POCT glucose (manual entry)  Result Value Ref Range   POC Glucose 120 (A) 70 - 99 mg/dl  POCT glycosylated hemoglobin (Hb A1C)  Result Value Ref Range   Hemoglobin A1C 6.0      EKG/XRAY:   Primary read interpreted by Dr. Cleta Alberts at [facility].   ASSESSMENT/PLAN: Patient looks great. Consider ordering a CT of the chest the first of the year to follow-up on a known  pulmonary nodule previously measured at 5 mm. Flu shot today. Recheck 3-4 months.I personally performed the services described in this documentation, which was scribed in my presence. The recorded information has been reviewed and is accurate. we'll give the pneumonia 23 vaccine on his next follow-up visit.   By signing my name below, I, [name], attest that this documentation has been prepared under the direction and in the presence of [name], MD.  Electronically Signed: [name], Medical Scribe. 12/21/2014. 9:30 AM.  Michaell Cowing sideeffects, risk and benefits, and alternatives of medications d/w patient. Patient is aware that all medications have potential sideeffects and we are unable to predict every sideeffect or drug-drug interaction that may occur.  [name] MD 12/21/2014 9:30 AM

## 2014-12-28 ENCOUNTER — Encounter: Payer: Self-pay | Admitting: Emergency Medicine

## 2015-01-28 MED ORDER — LEVOTHYROXINE SODIUM 50 MCG PO TABS
50 MCG | ORAL_TABLET | ORAL | 2 refills | Status: DC
Start: 2015-01-28 — End: 2015-11-24

## 2015-01-28 NOTE — Telephone Encounter (Signed)
08/19/14 LV

## 2015-04-19 ENCOUNTER — Encounter: Payer: Self-pay | Admitting: Emergency Medicine

## 2015-04-19 ENCOUNTER — Ambulatory Visit (INDEPENDENT_AMBULATORY_CARE_PROVIDER_SITE_OTHER): Payer: Medicare Other | Admitting: Emergency Medicine

## 2015-04-19 VITALS — BP 143/84 | HR 67 | Temp 98.1°F | Resp 16 | Ht 71.0 in | Wt 328.0 lb

## 2015-04-19 DIAGNOSIS — E785 Hyperlipidemia, unspecified: Secondary | ICD-10-CM | POA: Diagnosis not present

## 2015-04-19 DIAGNOSIS — L989 Disorder of the skin and subcutaneous tissue, unspecified: Secondary | ICD-10-CM | POA: Diagnosis not present

## 2015-04-19 DIAGNOSIS — I10 Essential (primary) hypertension: Secondary | ICD-10-CM

## 2015-04-19 DIAGNOSIS — R911 Solitary pulmonary nodule: Secondary | ICD-10-CM

## 2015-04-19 DIAGNOSIS — Z1159 Encounter for screening for other viral diseases: Secondary | ICD-10-CM | POA: Diagnosis not present

## 2015-04-19 DIAGNOSIS — R739 Hyperglycemia, unspecified: Secondary | ICD-10-CM | POA: Diagnosis not present

## 2015-04-19 LAB — BASIC METABOLIC PANEL WITH GFR
BUN: 11 mg/dL (ref 7–25)
CHLORIDE: 102 mmol/L (ref 98–110)
CO2: 29 mmol/L (ref 20–31)
Calcium: 9.7 mg/dL (ref 8.6–10.3)
Creat: 0.92 mg/dL (ref 0.70–1.25)
GFR, EST NON AFRICAN AMERICAN: 87 mL/min (ref >=60)
GFR, Est African American: >89 mL/min (ref >=60)
Glucose, Bld: 103 mg/dL — ABNORMAL HIGH (ref 65–99)
POTASSIUM: 4.1 mmol/L (ref 3.5–5.3)
SODIUM: 138 mmol/L (ref 135–146)

## 2015-04-19 LAB — HEPATITIS C ANTIBODY: HCV Ab: NEGATIVE

## 2015-04-19 LAB — GLUCOSE, POCT (MANUAL RESULT ENTRY): POC GLUCOSE: 117 mg/dL — AB (ref 70–99)

## 2015-04-19 LAB — POCT GLYCOSYLATED HEMOGLOBIN (HGB A1C): HEMOGLOBIN A1C: 6.2

## 2015-04-19 NOTE — Progress Notes (Addendum)
Patient ID: Malik [name], male   DOB: June 19, 1950, 65 y.o.   MRN: [id number]     By signing my name below, I, [name], attest that this documentation has been prepared under the direction and in the presence of [name], MD.  Electronically Signed: [name], Medical Scribe. 04/19/2015. 9:16 AM.   Chief Complaint:  Chief Complaint  Patient presents with  . Follow-up  . Hypertension    HPI: Malik [name] is a 65 y.o. male who reports to [facility] today for a follow-up for hypertension. Patient has been doing well overall. His friend is currently in the hospital due to CVA and his dog (a Yorkipoo) recently fractured one of his legs. Patient states he has been compliant with all his medications, including aspirin.  He is due to see his eye doctor, but plans to see his eye doctor this year.  Patient has been exercising. He denies chest pain and abdominal pain.  Past Medical History  Diagnosis Date  . Hypertension   . Hyperglycemia   . Hyperlipidemia   . Arthritis   . Obesity   . Aortic stenosis    Past Surgical History  Procedure Laterality Date  . Appendectomy     Social History   Social History  . Marital Status: Divorced    Spouse Name: N/A  . Number of Children: N/A  . Years of Education: N/A   Social History Main Topics  . Smoking status: Former Smoker -- 20 years    Types: Cigarettes, Cigars    Quit date: 08/19/1978  . Smokeless tobacco: None  . Alcohol Use: No  . Drug Use: No  . Sexual Activity: Not Asked   Other Topics Concern  . None   Social History Narrative   Family History  Problem Relation Age of Onset  . Cancer Mother   . Heart disease Father   . Heart disease Brother   . Cancer Brother     throat cancer   No Known Allergies Prior to Admission medications   Medication Sig Start Date End Date Taking? Authorizing Provider  aspirin 325 MG EC tablet Take 325 mg by mouth daily.   Yes Historical Provider, MD  hydrochlorothiazide  (HYDRODIURIL) 25 MG tablet Take 1 tablet (25 mg total) by mouth daily. 09/14/14  Yes Collene Gobble, MD  lisinopril (PRINIVIL,ZESTRIL) 20 MG tablet Take 1 tablet (20 mg total) by mouth daily. 09/14/14  Yes Collene Gobble, MD  metoprolol succinate (TOPROL-XL) 100 MG 24 hr tablet Take 1 tablet every day 09/14/14  Yes Collene Gobble, MD     ROS: The patient denies fevers, chills, night sweats, unintentional weight loss, chest pain, palpitations, wheezing, dyspnea on exertion, nausea, vomiting, abdominal pain, dysuria, hematuria, melena, numbness, weakness, or tingling.   All other systems have been reviewed and were otherwise negative with the exception of those mentioned in the HPI and as above.    PHYSICAL EXAM: Filed Vitals:   04/19/15 0909  BP: 143/84  Pulse: 67  Temp: 98.1 F (36.7 C)  Resp: 16   Body mass index is 45.77 kg/(m^2).   General: Alert, no acute distress. Morbidly obese. HEENT:  Normocephalic, atraumatic, oropharynx patent. Eye: Nonie Hoyer [facility] Cardiovascular: Grade 2 murmur, upper left sternal border.   Respiratory: Clear to auscultation bilaterally.  No wheezes, rales, or rhonchi.  No cyanosis, no use of accessory musculature Abdominal: No organomegaly, abdomen is soft and non-tender, positive bowel sounds.  No masses. Musculoskeletal: Gait intact. No edema,  tenderness Skin: There is a patch of dry scaly irritated scan right lateral elbow. Neurologic: Facial musculature symmetric. Psychiatric: Patient acts appropriately throughout our interaction. Lymphatic: No cervical or submandibular lymphadenopathy     LABS: Results for orders placed or performed in visit on 04/19/15  POCT glucose (manual entry)  Result Value Ref Range   POC Glucose 117 (A) 70 - 99 mg/dl  POCT glycosylated hemoglobin (Hb A1C)  Result Value Ref Range   Hemoglobin A1C 6.2      EKG/XRAY:   Primary read interpreted by Dr. Cleta Alberts at [facility].   ASSESSMENT/PLAN: Hemoglobin A1c stable at 6.2. We'll  schedule CT to follow-up of a known left upper lobe nodule. Have suggested follow-up in 3 months.I personally performed the services described in this documentation, which was scribed in my presence. The recorded information has been reviewed and is accurate patient also referred to dermatology. He has an area what looks like actinic keratosis on the right elbow but 2 areas may be transitioning to squamous cell. Referral made..I personally performed the services described in this documentation, which was scribed in my presence. The recorded information has been reviewed and is accurate.    Gross sideeffects, risk and benefits, and alternatives of medications d/w patient. Patient is aware that all medications have potential sideeffects and we are unable to predict every sideeffect or drug-drug interaction that may occur.  [name] MD 04/19/2015 9:16 AM

## 2015-04-26 ENCOUNTER — Other Ambulatory Visit: Payer: Self-pay

## 2015-04-26 ENCOUNTER — Ambulatory Visit
Admission: RE | Admit: 2015-04-26 | Discharge: 2015-04-26 | Disposition: A | Discharge status: Home / Self Care | Payer: Medicare Other | Source: Ambulatory Visit | Attending: Emergency Medicine | Admitting: Emergency Medicine

## 2015-04-26 DIAGNOSIS — R911 Solitary pulmonary nodule: Secondary | ICD-10-CM

## 2015-05-26 ENCOUNTER — Encounter: Payer: Self-pay | Admitting: Cardiovascular Disease

## 2015-05-26 ENCOUNTER — Telehealth: Payer: Self-pay | Admitting: Cardiovascular Disease

## 2015-05-26 NOTE — Telephone Encounter (Signed)
New Message  This message is to inform you that we have made 3 consecutive attempts to contact the patient since 02/24/2015. We have also mailed a letter to the patient to inform them to call in and schedule. Although we were unsuccessful in these attempts we wanted you to be aware of our efforts. Will remove the patient from our staff messages at this time.   Sh[facility]

## 2015-07-28 ENCOUNTER — Encounter: Payer: Self-pay | Admitting: Emergency Medicine

## 2015-07-28 ENCOUNTER — Ambulatory Visit (INDEPENDENT_AMBULATORY_CARE_PROVIDER_SITE_OTHER): Payer: Medicare Other | Admitting: Emergency Medicine

## 2015-07-28 VITALS — BP 118/80 | HR 68 | Temp 97.7°F | Resp 16 | Ht 71.5 in | Wt 326.6 lb

## 2015-07-28 DIAGNOSIS — R739 Hyperglycemia, unspecified: Secondary | ICD-10-CM

## 2015-07-28 DIAGNOSIS — I350 Nonrheumatic aortic (valve) stenosis: Secondary | ICD-10-CM

## 2015-07-28 DIAGNOSIS — I10 Essential (primary) hypertension: Secondary | ICD-10-CM | POA: Diagnosis not present

## 2015-07-28 DIAGNOSIS — E785 Hyperlipidemia, unspecified: Secondary | ICD-10-CM | POA: Diagnosis not present

## 2015-07-28 DIAGNOSIS — Z23 Encounter for immunization: Secondary | ICD-10-CM

## 2015-07-28 DIAGNOSIS — Z125 Encounter for screening for malignant neoplasm of prostate: Secondary | ICD-10-CM | POA: Diagnosis not present

## 2015-07-28 LAB — LIPID PANEL
CHOL/HDL RATIO: 4.4 ratio (ref <=5.0)
CHOLESTEROL: 122 mg/dL — AB (ref 125–200)
HDL: 28 mg/dL — AB (ref >=40)
LDL Cholesterol: 73 mg/dL (ref <130)
Triglycerides: 104 mg/dL (ref <150)
VLDL: 21 mg/dL (ref <30)

## 2015-07-28 LAB — BASIC METABOLIC PANEL WITH GFR
BUN: 13 mg/dL (ref 7–25)
CHLORIDE: 102 mmol/L (ref 98–110)
CO2: 25 mmol/L (ref 20–31)
Calcium: 9.7 mg/dL (ref 8.6–10.3)
Creat: 0.98 mg/dL (ref 0.70–1.25)
GFR, EST NON AFRICAN AMERICAN: 81 mL/min (ref >=60)
Glucose, Bld: 116 mg/dL — ABNORMAL HIGH (ref 65–99)
POTASSIUM: 4.1 mmol/L (ref 3.5–5.3)
Sodium: 138 mmol/L (ref 135–146)

## 2015-07-28 LAB — MICROALBUMIN, URINE: MICROALB UR: 0.4 mg/dL

## 2015-07-28 LAB — POCT GLYCOSYLATED HEMOGLOBIN (HGB A1C): HEMOGLOBIN A1C: 6.3

## 2015-07-28 LAB — GLUCOSE, POCT (MANUAL RESULT ENTRY): POC GLUCOSE: 122 mg/dL — AB (ref 70–99)

## 2015-07-28 NOTE — Progress Notes (Signed)
By signing my name below, I, Stann Ore, attest that this documentation has been prepared under the direction and in the presence of [name], MD. Electronically Signed: Stann Ore, Scribe. 07/28/2015 , 9:34 AM .  Patient was seen in room 21 .  Chief Complaint:  Chief Complaint  Patient presents with  . Follow-up    3 months    HPI: Malik [name] is a 65 y.o. male who reports to [facility] today for follow up.  He has some discomfort in his knees after over-exercising on the elliptical. He's been off of the elliptical and continuing with some light weights.   He has skin lesions over his right arm. He was referred to dermatology but was informed "it'll go away on its own".   Past Medical History  Diagnosis Date  . Hypertension   . Hyperglycemia   . Hyperlipidemia   . Arthritis   . Obesity   . Aortic stenosis    Past Surgical History  Procedure Laterality Date  . Appendectomy     Social History   Social History  . Marital Status: Divorced    Spouse Name: N/A  . Number of Children: N/A  . Years of Education: N/A   Social History Main Topics  . Smoking status: Former Smoker -- 20 years    Types: Cigarettes, Cigars    Quit date: 08/19/1978  . Smokeless tobacco: Not on file  . Alcohol Use: No  . Drug Use: No  . Sexual Activity: Not on file   Other Topics Concern  . Not on file   Social History Narrative   Family History  Problem Relation Age of Onset  . Cancer Mother   . Heart disease Father   . Heart disease Brother   . Cancer Brother     throat cancer   No Known Allergies Prior to Admission medications   Medication Sig Start Date End Date Taking? Authorizing Provider  aspirin 325 MG EC tablet Take 325 mg by mouth daily.   Yes Historical Provider, MD  hydrochlorothiazide (HYDRODIURIL) 25 MG tablet Take 1 tablet (25 mg total) by mouth daily. 09/14/14  Yes Collene Gobble, MD  lisinopril (PRINIVIL,ZESTRIL) 20 MG tablet Take 1 tablet (20 mg total)  by mouth daily. 09/14/14  Yes Collene Gobble, MD  metoprolol succinate (TOPROL-XL) 100 MG 24 hr tablet Take 1 tablet every day 09/14/14  Yes Collene Gobble, MD  simvastatin (ZOCOR) 80 MG tablet Take 80 mg by mouth at bedtime.   Yes Historical Provider, MD     ROS:  Constitutional: negative for fever, chills, night sweats, weight changes, or fatigue  HEENT: negative for vision changes, hearing loss, congestion, rhinorrhea, ST, epistaxis, or sinus pressure Cardiovascular: negative for chest pain or palpitations Respiratory: negative for hemoptysis, wheezing, shortness of breath, or cough Abdominal: negative for abdominal pain, nausea, vomiting, diarrhea, or constipation Dermatological: negative for rash Neurologic: negative for headache, dizziness, or syncope All other systems reviewed and are otherwise negative with the exception to those above and in the HPI.  PHYSICAL EXAM: Filed Vitals:   07/28/15 0858  BP: 118/80  Pulse: 68  Temp: 97.7 F (36.5 C)  Resp: 16   Body mass index is 44.92 kg/(m^2).   General: Alert, no acute distress HEENT:  Normocephalic, atraumatic, oropharynx patent. Eye: Nonie Hoyer [facility] Cardiovascular:  2/6 systolic murmur at the base of heart, no carotid bruit, radial pulse intact. No pedal edema.  Respiratory: Clear to auscultation bilaterally.  No  wheezes, rales, or rhonchi.  No cyanosis, no use of accessory musculature Abdominal: No organomegaly, abdomen is soft and non-tender, positive bowel sounds. No masses. Musculoskeletal: Gait intact. no ulceration no edema Skin: No rashes. Neurologic: Facial musculature symmetric. Psychiatric: Patient acts appropriately throughout our interaction.  Lymphatic: No cervical or submandibular lymphadenopathy Genitourinary/Anorectal: No acute findings   LABS: Results for orders placed or performed in visit on 07/28/15  POCT glucose (manual entry)  Result Value Ref Range   POC Glucose 122 (A) 70 - 99 mg/dl  POCT  glycosylated hemoglobin (Hb A1C)  Result Value Ref Range   Hemoglobin A1C 6.3     EKG/XRAY:     ASSESSMENT/PLAN: Patient given his updated pneumonia 23 vaccine. He will transition over to Dr. Neva SeatGreene. No change in medication. A1c level today was 6.3.I personally performed the services described in this documentation, which was scribed in my presence. The recorded information has been reviewed and is accurate.  Gross sideeffects, risk and benefits, and alternatives of medications d/w patient. Patient is aware that all medications have potential sideeffects and we are unable to predict every sideeffect or drug-drug interaction that may occur.  Lesle ChrisSteven Yoskar Murrillo MD 07/28/2015 9:07 AM

## 2015-07-29 LAB — PSA, MEDICARE: PSA: 0.61 ng/mL (ref <=4.00)

## 2015-10-05 ENCOUNTER — Other Ambulatory Visit: Payer: Self-pay | Admitting: Emergency Medicine

## 2015-11-24 MED ORDER — LEVOTHYROXINE SODIUM 50 MCG PO TABS
50 MCG | ORAL_TABLET | ORAL | 0 refills | Status: DC
Start: 2015-11-24 — End: 2016-01-16

## 2015-11-24 NOTE — Telephone Encounter (Signed)
Pt has mad an apt for 12/02/2015. He is a previous pt of Dr Corky Downs. He states that his copay is the same for a 1 week supply, 30 day supply and 90 day supply. He does not want to pay more so he is requesting a 90 day supply.

## 2015-12-01 ENCOUNTER — Ambulatory Visit: Payer: Medicare Other | Admitting: Family Medicine

## 2015-12-02 ENCOUNTER — Ambulatory Visit: Admit: 2015-12-02 | Discharge: 2015-12-02 | Payer: BLUE CROSS/BLUE SHIELD | Attending: Family | Primary: Family

## 2015-12-02 DIAGNOSIS — E039 Hypothyroidism, unspecified: Secondary | ICD-10-CM

## 2015-12-02 NOTE — Progress Notes (Signed)
[facility]  [address]  [place], [place] [address]  [phone]      Dustin Zimmerman is a 65 y.o. male who presents today for his  medical conditions/complaints as noted below.  Dustin Zimmerman is c/o of Medication Check (declines flu,and pneumo (would like to discuss first,may want to wait until physical)) and Hypothyroidism  .    HPI:   HPI Comments: Pt here for med check and lab orders. He sees a nutrionist regularly and they have given him a list of labs to get checked. HE is feeling good with no current problems. He is eating a healthy balanced diet and exercising at least 4 days per week. Stable on levothyroxine and same dose for awhile now.       Past Medical History:   Diagnosis Date   ??? Hypogonadism    ??? Hypothyroid    ??? Lumbago    ??? Malaise and fatigue    ??? Myalgia       Past Surgical History:   Procedure Laterality Date   ??? EYE SURGERY     ??? FOOT SURGERY      middle toe   ??? KNEE SURGERY     ??? SHOULDER SURGERY       Family History   Problem Relation Age of Onset   ??? Other Father      aspiration pneumonia     Social History   Substance Use Topics   ??? Smoking status: Former Smoker   ??? Smokeless tobacco: Never Used   ??? Alcohol use 0.0 oz/week     0 Standard drinks or equivalent per week      Comment: occ      Current Outpatient Prescriptions   Medication Sig Dispense Refill   ??? levothyroxine (SYNTHROID) 50 MCG tablet TAKE 1 TABLET DAILY 90 tablet 0   ??? dorzolamide-timolol (COSOPT) 22.3-6.8 MG/ML ophthalmic solution Place 2 drops into both eyes every 12 hours 1 Bottle 3   ??? naproxen (NAPROSYN) 375 MG tablet Take 1 tablet by mouth 2 times daily (with meals) 40 tablet 0   ??? tadalafil (CIALIS) 20 MG tablet Take 1 tablet by mouth as needed for Erectile Dysfunction 15 tablet 3   ??? triamcinolone (KENALOG) 0.1 % cream   4   ??? prednisoLONE acetate (PRED FORTE) 1 % ophthalmic suspension        No current facility-administered medications for this visit.      Allergies   Allergen Reactions    ??? Motrin [Ibuprofen]    ??? Percocet [Oxycodone-Acetaminophen] Hives   ??? Motrin [Ibuprofen] Nausea And Vomiting     kidney   ??? Percocet [Oxycodone-Acetaminophen] Rash       Health Maintenance   Topic Date Due   ??? Pneumococcal low/med risk (1 of 2 - PCV13) 12/28/2014   ??? Flu vaccine (1) 11/25/2015   ??? Diabetes screen  07/01/2017   ??? Lipid screen  07/02/2019   ??? DTaP/Tdap/Td vaccine (2 - Td) 09/30/2023   ??? Colon cancer screen colonoscopy  07/26/2025   ??? Zostavax vaccine  Addressed   ??? AAA screen  Completed   ??? Hepatitis C screen  Addressed   ??? HIV screen  Addressed       Subjective:      Review of Systems   Constitutional: Positive for fatigue (off and on). Negative for activity change, appetite change, chills, diaphoresis and fever.   Eyes: Negative for visual disturbance.   Respiratory: Negative for  cough, chest tightness and shortness of breath.    Cardiovascular: Negative for chest pain, palpitations and leg swelling.   Gastrointestinal: Negative for abdominal pain, blood in stool, constipation, diarrhea, nausea and vomiting.   Musculoskeletal: Positive for back pain (off and on).   Skin: Negative for rash.   Neurological: Negative for dizziness, weakness, light-headedness, numbness and headaches.   Hematological: Negative for adenopathy.   Psychiatric/Behavioral: Negative for dysphoric mood and sleep disturbance. The patient is not nervous/anxious.          Objective:     Physical Exam   Constitutional: He is oriented to person, place, and time. He appears well-developed and well-nourished. No distress.   HENT:   Head: Normocephalic and atraumatic.   Neck: Neck supple. No thyromegaly present.   Cardiovascular: Normal rate, regular rhythm, normal heart sounds and intact distal pulses.    No LE edema   Pulmonary/Chest: Effort normal and breath sounds normal.   Lymphadenopathy:     He has no cervical adenopathy.   Neurological: He is alert and oriented to person, place, and time.   Skin: Skin is warm and dry. He is  not diaphoretic.   Psychiatric: He has a normal mood and affect. His behavior is normal. Judgment and thought content normal.   Nursing note and vitals reviewed.      Assessment:      1. Acquired hypothyroidism  TSH    T4, Free    C-Reactive Protein High Sensitivity    T3, Free   2. Routine general medical examination at a health care facility  Comprehensive Metabolic Panel    Lipid Panel    CBC With Auto Differential    Hemoglobin A1C    PSA Screening    TSH    T4, Free   3. Screening for lipid disorders  Lipid Panel   4. Screening PSA (prostate specific antigen)  PSA Screening   5. Malaise and fatigue  C-Reactive Protein High Sensitivity    Homocysteine, Serum    Testosterone, Free    Testosterone    Estrogens, Fractionated    DHEA-Sulfate    Sex Hormone Binding Globulin    Vitamin D 25 Hydroxy    Ferritin   6. Need for vaccination with 13-polyvalent pneumococcal conjugate vaccine  Pneumococcal conjugate vaccine 13-valent IM (PREVNAR 13)     Wt Readings from Last 3 Encounters:   12/02/15 201 lb 12.8 oz (91.5 kg)   08/19/14 196 lb (88.9 kg)   05/26/14 199 lb (90.3 kg)     BP Readings from Last 3 Encounters:   12/02/15 127/84   08/19/14 118/84   05/26/14 110/62     Lab Results   Component Value Date    TSH 2.64 07/02/2014         Plan:      Return if symptoms worsen or fail to improve.  Orders Placed This Encounter   Procedures   ??? Pneumococcal conjugate vaccine 13-valent IM (PREVNAR 13)   ??? Comprehensive Metabolic Panel     Standing Status:   Future     Standing Expiration Date:   12/01/2016   ??? Lipid Panel     Standing Status:   Future     Standing Expiration Date:   12/01/2016     Order Specific Question:   Is Patient Fasting?/# of Hours     Answer:   8-10hrs   ??? CBC With Auto Differential     Standing Status:   Future  Standing Expiration Date:   12/01/2016   ??? Hemoglobin A1C     Standing Status:   Future     Standing Expiration Date:   12/01/2016   ??? PSA Screening     Standing Status:   Future     Standing  Expiration Date:   12/01/2016   ??? TSH     Standing Status:   Future     Standing Expiration Date:   12/01/2016   ??? T4, Free     Standing Status:   Future     Standing Expiration Date:   12/01/2016   ??? C-Reactive Protein High Sensitivity     Standing Status:   Future     Standing Expiration Date:   12/01/2016   ??? Homocysteine, Serum     Standing Status:   Future     Standing Expiration Date:   12/01/2016   ??? Testosterone, Free     Standing Status:   Future     Standing Expiration Date:   12/02/2016   ??? Testosterone     Standing Status:   Future     Standing Expiration Date:   12/01/2016   ??? Estrogens, Fractionated     Standing Status:   Future     Standing Expiration Date:   12/01/2016   ??? DHEA-Sulfate     Standing Status:   Future     Standing Expiration Date:   12/01/2016   ??? Sex Hormone Binding Globulin     Standing Status:   Future     Standing Expiration Date:   12/01/2016   ??? Vitamin D 25 Hydroxy     Standing Status:   Future     Standing Expiration Date:   12/01/2016   ??? T3, Free     Standing Status:   Future     Standing Expiration Date:   12/01/2016   ??? Ferritin     Standing Status:   Future     Standing Expiration Date:   12/01/2016     Lab orders placed per pt request  Will call with test results  Doing well  Continue daily exercise and healthy LF diet  Good on refills  prevnar given    Patient given educational materials - see patient instructions.  Discussed use, benefit, and side effects of prescribed medications.  All patient questions answered.  Pt voiced understanding. Reviewed health maintenance.  Instructed to continue current medications, diet and exercise.    Electronically signed by Ardeth Perfect Smith,CNP on 12/02/2015 at 2:23 PM

## 2015-12-02 NOTE — Progress Notes (Signed)
Visit Information    Have you changed or started any medications since your last visit including any over-the-counter medicines, vitamins, or herbal medicines? no   Have you stopped taking any of your medications? Is so, why? -  no  Are you having any side effects from any of your medications? - no    Have you seen any other physician or provider since your last visit?  no   Have you had any other diagnostic tests since your last visit?  no   Have you been seen in the emergency room and/or had an admission in a hospital since we last saw you?  no   Have you had your routine dental cleaning in the past 6 months?  no     Do you have an active MyChart account? If no, what is the barrier?  No: na    Patient Care Team:  Alger Memos, MD    Medical History Review  Past Medical, Family, and Social History reviewed and  contribute to the patient presenting condition    Health Maintenance   Topic Date Due   ??? Pneumococcal low/med risk (1 of 2 - PCV13) 12/28/2014   ??? Flu vaccine (1) 11/25/2015   ??? Lipid screen  07/02/2019   ??? DTaP/Tdap/Td vaccine (2 - Td) 09/30/2023   ??? Colon cancer screen colonoscopy  07/26/2025   ??? Zostavax vaccine  Addressed   ??? AAA screen  Completed   ??? Hepatitis C screen  Addressed   ??? HIV screen  Addressed

## 2015-12-02 NOTE — Progress Notes (Signed)
After obtaining consent, and per orders of Kelly Smith, CNP, injection of Prevnar given in Left deltoid by Barney Gertsch. Patient instructed to remain in clinic for 20 minutes afterwards, and to report any adverse reaction to me immediately.

## 2015-12-15 ENCOUNTER — Encounter: Payer: Self-pay | Admitting: Family Medicine

## 2015-12-15 ENCOUNTER — Ambulatory Visit (INDEPENDENT_AMBULATORY_CARE_PROVIDER_SITE_OTHER): Payer: Medicare Other | Admitting: Family Medicine

## 2015-12-15 VITALS — BP 130/78 | HR 74 | Temp 98.1°F | Ht 71.25 in | Wt 321.0 lb

## 2015-12-15 DIAGNOSIS — L03119 Cellulitis of unspecified part of limb: Secondary | ICD-10-CM | POA: Diagnosis not present

## 2015-12-15 DIAGNOSIS — I10 Essential (primary) hypertension: Secondary | ICD-10-CM

## 2015-12-15 DIAGNOSIS — L02419 Cutaneous abscess of limb, unspecified: Secondary | ICD-10-CM

## 2015-12-15 DIAGNOSIS — Z23 Encounter for immunization: Secondary | ICD-10-CM | POA: Diagnosis not present

## 2015-12-15 DIAGNOSIS — E785 Hyperlipidemia, unspecified: Secondary | ICD-10-CM | POA: Diagnosis not present

## 2015-12-15 DIAGNOSIS — R7303 Prediabetes: Secondary | ICD-10-CM | POA: Diagnosis not present

## 2015-12-15 LAB — COMPLETE METABOLIC PANEL WITH GFR
ALBUMIN: 4.2 g/dL (ref 3.6–5.1)
ALK PHOS: 51 U/L (ref 40–115)
ALT: 38 U/L (ref 9–46)
AST: 27 U/L (ref 10–35)
BILIRUBIN TOTAL: 0.7 mg/dL (ref 0.2–1.2)
BUN: 15 mg/dL (ref 7–25)
CALCIUM: 9.9 mg/dL (ref 8.6–10.3)
CO2: 28 mmol/L (ref 20–31)
Chloride: 103 mmol/L (ref 98–110)
Creat: 0.94 mg/dL (ref 0.70–1.25)
GFR, Est Non African American: 85 mL/min (ref >=60)
Glucose, Bld: 115 mg/dL — ABNORMAL HIGH (ref 65–99)
POTASSIUM: 4.2 mmol/L (ref 3.5–5.3)
Sodium: 141 mmol/L (ref 135–146)
Total Protein: 7.2 g/dL (ref 6.1–8.1)

## 2015-12-15 LAB — LIPID PANEL
CHOL/HDL RATIO: 4.3 ratio (ref <=5.0)
CHOLESTEROL: 129 mg/dL (ref 125–200)
HDL: 30 mg/dL — AB (ref >=40)
LDL Cholesterol: 79 mg/dL (ref <130)
Triglycerides: 98 mg/dL (ref <150)
VLDL: 20 mg/dL (ref <30)

## 2015-12-15 MED ORDER — DOXYCYCLINE HYCLATE 100 MG PO TABS: 100.0000 mg | ORAL_TABLET | Freq: Two times a day (BID) | ORAL | Status: DC

## 2015-12-15 MED ORDER — DOXYCYCLINE HYCLATE 100 MG PO TABS: 100.0000 mg | ORAL_TABLET | Freq: Two times a day (BID) | ORAL | 0 refills | Status: DC

## 2015-12-15 NOTE — Progress Notes (Signed)
Subjective:  By signing my name below, I, Malik [name], attest that this documentation has been prepared under the direction and in the presence of Meredith StaggersJeffrey Shalane Florendo, MD. Electronically Signed: Stann Oresung-Kai [name], Scribe. 12/15/2015 , 8:58 AM .  Patient was seen in Room 4 .   Patient ID: Malik [name], male    DOB: 05/18/1950, 65 y.o.   MRN: [id number] Chief Complaint  Patient presents with  . Follow-up    DM, HTN, HLipidemia,  . Medication Refill    HCTZ, Lisinopril,Metoprol, Simvasttin   HPI Malik [name] is a 65 y.o. male Here for follow up. Previous patient of Dr. Cleta Albertsaub. He requests medication refill of HCTZ, lisinopril, metoprolol and simvastatin. Prefers no Scribes.   Bump He noticed a bump inside his left thigh a few days ago, seemed to be more firm. He denies fever, chills or pus.   HTN Lab Results  Component Value Date   CREATININE 0.98 07/28/2015   He takes HCTZ, metoprolol, and lisinopril. He [place] his BP [place] the fire station, as he's a retired Company secretaryfireman. It runs about 125/70s~80s. He denies blood in stool, chest pain, shortness of breath, nausea, or vomiting.   Pre-Diabetes Lab Results  Component Value Date   HGBA1C 6.3 07/28/2015   He was well controlled at last visit on diet.   Lab Results  Component Value Date   MICROALBUR 0.4 07/28/2015   Wt Readings from Last 3 Encounters:  12/15/15 (!) 321 lb (145.6 kg)  07/28/15 (!) 326 lb 9.6 oz (148.1 kg)  04/19/15 (!) 328 lb (148.8 kg)   Body mass index is 44.46 kg/m.   Vision He denies seeing eye doctor in past year.   HLD Lab Results  Component Value Date   CHOL 122 (L) 07/28/2015   HDL 28 (L) 07/28/2015   LDLCALC 73 07/28/2015   TRIG 104 07/28/2015   CHOLHDL 4.4 07/28/2015   Lab Results  Component Value Date   ALT 37 05/13/2014   AST 24 05/13/2014   ALKPHOS 56 05/13/2014   BILITOT 0.6 05/13/2014   Immunizations He agrees to flu shot today.    Patient Active Problem List   Diagnosis Date  Noted  . Solitary pulmonary nodule 05/13/2014  . Aortic stenosis, moderate 01/06/2013  . Murmur 11/17/2012  . Dyspnea 11/17/2012  . Hypertension 12/18/2011  . Hyperglycemia 12/18/2011  . Hyperlipidemia 12/18/2011  . Arthritis 12/18/2011  . Obesity 12/18/2011   Past Medical History:  Diagnosis Date  . Aortic stenosis   . Arthritis   . Hyperglycemia   . Hyperlipidemia   . Hypertension   . Obesity    Past Surgical History:  Procedure Laterality Date  . APPENDECTOMY     No Known Allergies Prior to Admission medications   Medication Sig Start Date End Date Taking? Authorizing Provider  aspirin 325 MG EC tablet Take 325 mg by mouth daily.   Yes Historical Provider, MD  hydrochlorothiazide (HYDRODIURIL) 25 MG tablet TAKE 1 TABLET(25 MG) BY MOUTH DAILY 10/06/15  Yes Collene GobbleSteven A Daub, MD  lisinopril (PRINIVIL,ZESTRIL) 20 MG tablet TAKE 1 TABLET(20 MG) BY MOUTH DAILY 10/06/15  Yes Collene GobbleSteven A Daub, MD  metoprolol succinate (TOPROL-XL) 100 MG 24 hr tablet TAKE 1 TABLET BY MOUTH EVERY DAY 10/06/15  Yes Collene GobbleSteven A Daub, MD  simvastatin (ZOCOR) 80 MG tablet TAKE 1 TABLET(80 MG) BY MOUTH AT BEDTIME 10/06/15  Yes Collene GobbleSteven A Daub, MD   Social History   Social History  . Marital status: Divorced  Spouse name: N/A  . Number of children: N/A  . Years of education: N/A   Occupational History  . Not on file.   Social History Main Topics  . Smoking status: Former Smoker    Years: 20.00    Types: Cigarettes, Cigars    Quit date: 08/19/1978  . Smokeless tobacco: Not on file  . Alcohol use No  . Drug use: No  . Sexual activity: Not on file   Other Topics Concern  . Not on file   Social History Narrative  . No narrative on file   Review of Systems  Constitutional: Negative for fatigue and unexpected weight change.  Eyes: Negative for visual disturbance.  Respiratory: Negative for cough, chest tightness and shortness of breath.   Cardiovascular: Negative for chest pain, palpitations and leg  swelling.  Gastrointestinal: Negative for abdominal pain and blood in stool.  Neurological: Negative for dizziness, light-headedness and headaches.       Objective:   Physical Exam  Constitutional: He is oriented to person, place, and time. He appears well-developed and well-nourished.  HENT:  Head: Normocephalic and atraumatic.  Eyes: EOM are normal. Pupils are equal, round, and reactive to light.  Neck: No JVD present. Carotid bruit is not present.  Cardiovascular: Normal rate, regular rhythm and normal heart sounds.   No murmur heard. Pulmonary/Chest: Effort normal and breath sounds normal. He has no rales.  Musculoskeletal: He exhibits no edema.  Trace non pitting edema in lower extremities  Neurological: He is alert and oriented to person, place, and time.  Skin: Skin is warm and dry.  Has a 1.5~2cm indurated area at the upper medial thigh, lateral to the groin, able to express small amount of exudate; no surrounding erythema  Psychiatric: He has a normal mood and affect.  Vitals reviewed.   Vitals:   12/15/15 0821  BP: 130/78  Pulse: 74  Temp: 98.1 F (36.7 C)  TempSrc: Oral  SpO2: 95%  Weight: (!) 321 lb (145.6 kg)  Height: 5' 11.25" (1.81 m)      Assessment & Plan:    [name] is a 65 y.o. male Hyperlipidemia - Plan: COMPLETE METABOLIC PANEL WITH GFR, Lipid panel  - labs pending. Continue Zocor same dose for now.  Prediabetes - Plan: Hemoglobin A1C  -Diabetes versus prediabetes with most recent A1c level at 6.3. Diet controlled. Continue to work on diet and exercise. No new medications for now.  Essential hypertension - Plan: COMPLETE METABOLIC PANEL WITH GFR  -Stable. No change in meds.  Cellulitis and abscess of leg - Plan: WOUND CULTURE, doxycycline (VIBRA-TABS) 100 MG tablet, DISCONTINUED: doxycycline (VIBRA-TABS) tablet 100 mg  - Early left inner thigh abscess. Start doxycycline, warm compresses, culture obtained, and recheck in the next 48  hours if increased swelling or redness, otherwise within 3-4 days to determine if I&D necessary. RTC precautions if worsening.  Flu vaccine need - Plan: Flu Vaccine QUAD 36+ mos IM   Meds ordered this encounter  Medications  . DISCONTD: doxycycline (VIBRA-TABS) tablet 100 mg  . doxycycline (VIBRA-TABS) 100 MG tablet    Sig: Take 1 tablet (100 mg total) by mouth 2 (two) times daily.    Dispense:  20 tablet    Refill:  0   Patient Instructions   No change in your usual medications. I will check lab work. It seemed to work on diet and exercise, including avoiding fast food. If difficulty losing weight, we can discuss other options including nutritionist or  meeting with bariatric specialist in the future if needed.  The bump on the inside your thigh appears to be an early abscess or infected area. Start the antibiotic twice per day, warm compresses at least 3-4 times per day, and if increased swelling or redness in the next 2 days, return for recheck, otherwise follow-up in next 3-4 days for repeat evaluation and to determine if this needs to be opened. (sooner if worse)   Abscess An abscess is an infected area that contains a collection of pus and debris.It can occur in almost any part of the body. An abscess is also known as a furuncle or boil. CAUSES  An abscess occurs when tissue gets infected. This can occur from blockage of oil or sweat glands, infection of hair follicles, or a minor injury to the skin. As the body tries to fight the infection, pus collects in the area and creates pressure under the skin. This pressure causes pain. People with weakened immune systems have difficulty fighting infections and get certain abscesses more often.  SYMPTOMS Usually an abscess develops on the skin and becomes a painful mass that is red, warm, and tender. If the abscess forms under the skin, you may feel a moveable soft area under the skin. Some abscesses break open (rupture) on their own, but most  will continue to get worse without care. The infection can spread deeper into the body and eventually into the bloodstream, causing you to feel ill.  DIAGNOSIS  Your caregiver will take your medical history and perform a physical exam. A sample of fluid may also be taken from the abscess to determine what is causing your infection. TREATMENT  Your caregiver may prescribe antibiotic medicines to fight the infection. However, taking antibiotics alone usually does not cure an abscess. Your caregiver may need to make a small cut (incision) in the abscess to drain the pus. In some cases, gauze is packed into the abscess to reduce pain and to continue draining the area. HOME CARE INSTRUCTIONS   Only take over-the-counter or prescription medicines for pain, discomfort, or fever as directed by your caregiver.  If you were prescribed antibiotics, take them as directed. Finish them even if you start to feel better.  If gauze is used, follow your caregiver's directions for changing the gauze.  To avoid spreading the infection:  Keep your draining abscess covered with a bandage.  Wash your hands well.  Do not share personal care items, towels, or whirlpools with others.  Avoid skin contact with others.  Keep your skin and clothes clean around the abscess.  Keep all follow-up appointments as directed by your caregiver. SEEK MEDICAL CARE IF:   You have increased pain, swelling, redness, fluid drainage, or bleeding.  You have muscle aches, chills, or a general ill feeling.  You have a fever. MAKE SURE YOU:   Understand these instructions.  Will watch your condition.  Will get help right away if you are not doing well or get worse.   This information is not intended to replace advice given to you by your health care provider. Make sure you discuss any questions you have with your health care provider.   Document Released: 12/20/2004 Document Revised: 09/11/2011 Document Reviewed:  05/25/2011 Elsevier Interactive Patient Education 2016 [employer].    IF you received an x-ray today, you will receive an invoice from [facility]. Please contact [facility] at [phone] with questions or concerns regarding your invoice.   IF you received labwork today,  you will receive an invoice from [employer]. Please contact [facility] at [phone] with questions or concerns regarding your invoice.   Our billing staff will not be able to assist you with questions regarding bills from these companies.  You will be contacted with the lab results as soon as they are available. The fastest way to get your results is to activate your My Chart account. Instructions are located on the last page of this paperwork. If you have not heard from [place] regarding the results in 2 weeks, please contact this office.        I personally performed the services described in this documentation, which was scribed in my presence. The recorded information has been reviewed and considered, and addended by me as needed.   Signed,   Meredith Staggers, MD [facility].  12/15/15 7:46 PM

## 2015-12-15 NOTE — Patient Instructions (Addendum)
No change in your usual medications. I will check lab work. It seemed to work on diet and exercise, including avoiding fast food. If difficulty losing weight, we can discuss other options including nutritionist or meeting with bariatric specialist in the future if needed.  The bump on the inside your thigh appears to be an early abscess or infected area. Start the antibiotic twice per day, warm compresses at least 3-4 times per day, and if increased swelling or redness in the next 2 days, return for recheck, otherwise follow-up in next 3-4 days for repeat evaluation and to determine if this needs to be opened. (sooner if worse)   Abscess An abscess is an infected area that contains a collection of pus and debris.It can occur in almost any part of the body. An abscess is also known as a furuncle or boil. CAUSES  An abscess occurs when tissue gets infected. This can occur from blockage of oil or sweat glands, infection of hair follicles, or a minor injury to the skin. As the body tries to fight the infection, pus collects in the area and creates pressure under the skin. This pressure causes pain. People with weakened immune systems have difficulty fighting infections and get certain abscesses more often.  SYMPTOMS Usually an abscess develops on the skin and becomes a painful mass that is red, warm, and tender. If the abscess forms under the skin, you may feel a moveable soft area under the skin. Some abscesses break open (rupture) on their own, but most will continue to get worse without care. The infection can spread deeper into the body and eventually into the bloodstream, causing you to feel ill.  DIAGNOSIS  Your caregiver will take your medical history and perform a physical exam. A sample of fluid may also be taken from the abscess to determine what is causing your infection. TREATMENT  Your caregiver may prescribe antibiotic medicines to fight the infection. However, taking antibiotics alone  usually does not cure an abscess. Your caregiver may need to make a small cut (incision) in the abscess to drain the pus. In some cases, gauze is packed into the abscess to reduce pain and to continue draining the area. HOME CARE INSTRUCTIONS   Only take over-the-counter or prescription medicines for pain, discomfort, or fever as directed by your caregiver.  If you were prescribed antibiotics, take them as directed. Finish them even if you start to feel better.  If gauze is used, follow your caregiver's directions for changing the gauze.  To avoid spreading the infection:  Keep your draining abscess covered with a bandage.  Wash your hands well.  Do not share personal care items, towels, or whirlpools with others.  Avoid skin contact with others.  Keep your skin and clothes clean around the abscess.  Keep all follow-up appointments as directed by your caregiver. SEEK MEDICAL CARE IF:   You have increased pain, swelling, redness, fluid drainage, or bleeding.  You have muscle aches, chills, or a general ill feeling.  You have a fever. MAKE SURE YOU:   Understand these instructions.  Will watch your condition.  Will get help right away if you are not doing well or get worse.   This information is not intended to replace advice given to you by your health care provider. Make sure you discuss any questions you have with your health care provider.   Document Released: 12/20/2004 Document Revised: 09/11/2011 Document Reviewed: 05/25/2011 Elsevier Interactive Patient Education [employer].  IF you received an x-ray today, you will receive an invoice from [facility]. Please contact [facility] at [phone] with questions or concerns regarding your invoice.   IF you received labwork today, you will receive an invoice from [employer]. Please contact [facility] at [phone] with questions or concerns regarding your invoice.    Our billing staff will not be able to assist you with questions regarding bills from these companies.  You will be contacted with the lab results as soon as they are available. The fastest way to get your results is to activate your My Chart account. Instructions are located on the last page of this paperwork. If you have not heard from [place] regarding the results in 2 weeks, please contact this office.

## 2015-12-16 LAB — HEMOGLOBIN A1C
Hgb A1c MFr Bld: 5.9 % — ABNORMAL HIGH (ref <5.7)
Mean Plasma Glucose: 123 mg/dL

## 2015-12-17 LAB — WOUND CULTURE
GRAM STAIN: NONE SEEN
GRAM STAIN: NONE SEEN
Gram Stain: NONE SEEN
Organism ID, Bacteria: NORMAL

## 2016-01-05 LAB — COMPREHENSIVE METABOLIC PANEL
ALT: 27 U/L
AST: 22 U/L
Albumin: 4.2
Alkaline Phosphatase: 60 U/L
BUN: 20 mg/dL
CO2: 32 mmol/L
Calcium: 9.2 mg/dL
Chloride: 106 mmol/L
Creatinine: 0.82
Glucose: 90 mg/dL
Potassium: 4.4 mmol/L
Sodium: 144 mmol/L
Total Bilirubin: 0.5 mg/dL (ref 0.1–1.4)
Total Protein: 6.8

## 2016-01-05 LAB — T3, FREE: T3, Free: 3.26

## 2016-01-05 LAB — VITAMIN D 25 HYDROXY: Vit D, 25-Hydroxy: 53.3

## 2016-01-05 LAB — FERRITIN: Ferritin: 137 ng/mL (ref 18.0–300.0)

## 2016-01-05 LAB — CBC WITH AUTO DIFFERENTIAL

## 2016-01-05 LAB — LIPID PANEL
Chol/HDL Ratio: 3.5
Cholesterol, Total: 194 mg/dL
HDL: 56 mg/dL (ref 35–70)
LDL Calculated: 124 mg/dL (ref 0–160)
Triglycerides: 68 mg/dL
VLDL: 14 mg/dL

## 2016-01-05 LAB — T4, FREE: T4 Free: 0.92

## 2016-01-05 LAB — HEMOGLOBIN A1C
AVERAGE GLUCOSE: 120
Hemoglobin A1C: 5.8 %

## 2016-01-05 LAB — TSH: TSH: 2.98 u[IU]/mL

## 2016-01-05 LAB — SEX HORMONE BINDING GLOBULIN: Sex Hormone Binding: 37.1

## 2016-01-05 LAB — PSA SCREENING: PSA: 1.09 ng/mL

## 2016-01-09 ENCOUNTER — Encounter

## 2016-01-09 LAB — TESTOSTERONE: Testosterone: 445

## 2016-01-12 ENCOUNTER — Encounter

## 2016-01-16 MED ORDER — LEVOTHYROXINE SODIUM 50 MCG PO TABS
50 | ORAL_TABLET | ORAL | 3 refills | Status: DC
Start: 2016-01-16 — End: 2016-01-19

## 2016-01-16 NOTE — Telephone Encounter (Signed)
LV 12/02/15

## 2016-01-17 ENCOUNTER — Encounter

## 2016-01-19 MED ORDER — LEVOTHYROXINE SODIUM 50 MCG PO TABS
50 | ORAL_TABLET | ORAL | 3 refills | Status: DC
Start: 2016-01-19 — End: 2016-12-18

## 2016-01-19 NOTE — Telephone Encounter (Signed)
Patient stopped is stating the medication was [facility]. Can you please resend. Please advise. Thank you

## 2016-04-19 ENCOUNTER — Encounter: Payer: Self-pay | Admitting: Family Medicine

## 2016-04-19 ENCOUNTER — Ambulatory Visit (INDEPENDENT_AMBULATORY_CARE_PROVIDER_SITE_OTHER): Payer: Medicare Other | Admitting: Family Medicine

## 2016-04-19 VITALS — BP 118/78 | HR 65 | Temp 98.1°F | Ht 71.25 in | Wt 321.2 lb

## 2016-04-19 DIAGNOSIS — E785 Hyperlipidemia, unspecified: Secondary | ICD-10-CM

## 2016-04-19 DIAGNOSIS — E669 Obesity, unspecified: Secondary | ICD-10-CM | POA: Diagnosis not present

## 2016-04-19 DIAGNOSIS — IMO0001 Reserved for inherently not codable concepts without codable children: Secondary | ICD-10-CM

## 2016-04-19 DIAGNOSIS — Z6841 Body Mass Index (BMI) 40.0 and over, adult: Secondary | ICD-10-CM | POA: Diagnosis not present

## 2016-04-19 DIAGNOSIS — R7303 Prediabetes: Secondary | ICD-10-CM | POA: Diagnosis not present

## 2016-04-19 DIAGNOSIS — I10 Essential (primary) hypertension: Secondary | ICD-10-CM | POA: Diagnosis not present

## 2016-04-19 MED ORDER — HYDROCHLOROTHIAZIDE 25 MG PO TABS: ORAL_TABLET | ORAL | 1 refills | Status: DC

## 2016-04-19 MED ORDER — METOPROLOL SUCCINATE ER 100 MG PO TB24: 100.0000 mg | ORAL_TABLET | Freq: Every day | ORAL | 1 refills | Status: DC

## 2016-04-19 MED ORDER — LISINOPRIL 20 MG PO TABS: ORAL_TABLET | ORAL | 1 refills | Status: DC

## 2016-04-19 MED ORDER — SIMVASTATIN 80 MG PO TABS: ORAL_TABLET | ORAL | 1 refills | Status: DC

## 2016-04-19 NOTE — Patient Instructions (Addendum)
No change in medications for now. Follow-up within 6 months for a physical and will recheck kidney function and cholesterol tests at that time. As we discussed working on portion size along with increasing exercise to 5 days per week as well as avoidance of sugar containing beverages including weet tea as much as possible should help with weight loss.     IF you received an x-ray today, you will receive an invoice [facility]. Please contact [facility] at [phone] with questions or concerns regarding your invoice.   IF you received labwork today, you will receive an invoice from [facility]. Please contact [facility] at [phone] with questions or concerns regarding your invoice.   Our billing staff will not be able to assist you with questions regarding bills from these companies.  You will be contacted with the lab results as soon as they are available. The fastest way to get your results is to activate your My Chart account. Instructions are located on the last page of this paperwork. If you have not heard from us regarding the results in 2 weeks, please contact this office.

## 2016-04-19 NOTE — Progress Notes (Signed)
By signing my name below, I, Mesha Guinyard, attest that this documentation has been prepared under the direction and in the presence of Meredith Staggers, MD.  Electronically Signed: Arvilla Market, Medical Scribe. 04/19/16. 8:15 AM.  Subjective:    Patient ID: Malik [name], male    DOB: 14-Nov-1950, 66 y.o.   MRN: [id number]  HPI Chief Complaint  Patient presents with  . Follow-up    3 mth    HPI Comments: Malik [name] is a 66 y.o. male who presents to the [facility] for pre-DM follow up. Prev pt of Dr. Cleta Alberts, last seen by me in Sept. Pt has been fasting.  PreDM: He was continued on diet and exercise. Pt states there hasn't been any ph changes since his last visit, but he's getting over a cold. Pt used NyQuil and DayQuil for relief of his cold sxs. Pt has not been on DM medications in the past since he was never told he had DM. Pt exercises at the gym 3x a week.  Lab Results  Component Value Date   HGBA1C 5.9 (H) 12/15/2015   Lab Results  Component Value Date   MICROALBUR 0.4 07/28/2015   Wt Readings from Last 3 Encounters:  04/19/16 (!) 321 lb 3.2 oz (145.7 kg)  12/15/15 (!) 321 lb (145.6 kg)  07/28/15 (!) 326 lb 9.6 oz (148.1 kg)  Body mass index is 44.48 kg/m.  HTN: Pt is on lisinopril, toprol-xl and HCTZ. Denies chest pain, light-headedness, dizziness, rashes, abdominal pain, or blurry vision. BP Readings from Last 3 Encounters:  04/19/16 118/78  12/15/15 130/78  07/28/15 118/80   HLD: He was continued on zocor 80 mg QD. No new side effects of meds.  Lab Results  Component Value Date   CHOL 129 12/15/2015   HDL 30 (L) 12/15/2015   LDLCALC 79 12/15/2015   TRIG 98 12/15/2015   CHOLHDL 4.3 12/15/2015   Lab Results  Component Value Date   ALT 38 12/15/2015   AST 27 12/15/2015   ALKPHOS 51 12/15/2015   BILITOT 0.7 12/15/2015   Chest X-ray: Pt had a 4 mm left upper lobe nodule compatible with nodule on CT most recently scaned on Apr 26, 2015 - stable without recommended follow up at that time.   Immunizations: Pt received his flu shot this year.  Patient Active Problem List   Diagnosis Date Noted  . Solitary pulmonary nodule 05/13/2014  . Aortic stenosis, moderate 01/06/2013  . Murmur 11/17/2012  . Dyspnea 11/17/2012  . Hypertension 12/18/2011  . Hyperglycemia 12/18/2011  . Hyperlipidemia 12/18/2011  . Arthritis 12/18/2011  . Obesity 12/18/2011   Past Medical History:  Diagnosis Date  . Aortic stenosis   . Arthritis   . Hyperglycemia   . Hyperlipidemia   . Hypertension   . Obesity    Past Surgical History:  Procedure Laterality Date  . APPENDECTOMY     No Known Allergies Prior to Admission medications   Medication Sig Start Date End Date Taking? Authorizing Provider  aspirin 325 MG EC tablet Take 325 mg by mouth daily.   Yes Historical Provider, MD  hydrochlorothiazide (HYDRODIURIL) 25 MG tablet TAKE 1 TABLET(25 MG) BY MOUTH DAILY 10/06/15  Yes Collene Gobble, MD  lisinopril (PRINIVIL,ZESTRIL) 20 MG tablet TAKE 1 TABLET(20 MG) BY MOUTH DAILY 10/06/15  Yes Collene Gobble, MD  metoprolol succinate (TOPROL-XL) 100 MG 24 hr tablet TAKE 1 TABLET BY MOUTH EVERY DAY 10/06/15  Yes Maylon Peppers  Daub, MD  simvastatin (ZOCOR) 80 MG tablet TAKE 1 TABLET(80 MG) BY MOUTH AT BEDTIME 10/06/15  Yes Collene GobbleSteven A Daub, MD  doxycycline (VIBRA-TABS) 100 MG tablet Take 1 tablet (100 mg total) by mouth 2 (two) times daily. Patient not taking: Reported on 04/19/2016 12/15/15   Shade FloodJeffrey R Raynah Gomes, MD   Social History   Social History  . Marital status: Divorced    Spouse name: N/A  . Number of children: N/A  . Years of education: N/A   Occupational History  . Not on file.   Social History Main Topics  . Smoking status: Former Smoker    Years: 20.00    Types: Cigarettes, Cigars    Quit date: 08/19/1978  . Smokeless tobacco: Never Used  . Alcohol use No  . Drug use: No  . Sexual activity: Not on file   Other Topics Concern  .  Not on file   Social History Narrative  . No narrative on file   Review of Systems  Eyes: Negative for visual disturbance.  Cardiovascular: Negative for chest pain.  Gastrointestinal: Negative for abdominal pain.  Skin: Negative for rash.  Neurological: Negative for dizziness and light-headedness.   Objective:  Physical Exam  Constitutional: He appears well-developed and well-nourished. No distress.  HENT:  Head: Normocephalic and atraumatic.  Eyes: Conjunctivae are normal.  Neck: Neck supple.  Cardiovascular: Normal rate.   Murmur heard.  Systolic murmur is present with a grade of 1/6  Pulmonary/Chest: Effort normal.  Neurological: He is alert.  Skin: Skin is warm and dry.  Psychiatric: He has a normal mood and affect. His behavior is normal.  Nursing note and vitals reviewed.  BP 118/78 (BP Location: Right Arm, Patient Position: Sitting, Cuff Size: Large)   Pulse 65   Temp 98.1 F (36.7 C) (Oral)   Ht 5' 11.25" (1.81 m)   Wt (!) 321 lb 3.2 oz (145.7 kg)   SpO2 96%   BMI 44.48 kg/m  Assessment & Plan:   Malik GranaJimmy W [name] is a 66 y.o. 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 Check A1c.   Hyperlipidemia, unspecified hyperlipidemia type - Plan: simvastatin (ZOCOR) 80 MG tablet  - Tolerating Zocor 80 mg daily, previous labs okay, plan on repeat lipids, CMP at next visit within 6 months.  Essential hypertension - Plan: metoprolol succinate (TOPROL-XL) 100 MG 24 hr tablet, lisinopril (PRINIVIL,ZESTRIL) 20 MG tablet, hydrochlorothiazide (HYDRODIURIL) 25 MG tablet  - Stable. No change in medications for now.  Meds ordered this encounter  Medications  . simvastatin (ZOCOR) 80 MG  tablet    Sig: TAKE 1 TABLET(80 MG) BY MOUTH AT BEDTIME    Dispense:  90 tablet    Refill:  1  . metoprolol succinate (TOPROL-XL) 100 MG 24 hr tablet    Sig: Take 1 tablet (100 mg total) by mouth daily. Take with or immediately following a meal.    Dispense:  90 tablet    Refill:  1  . lisinopril (PRINIVIL,ZESTRIL) 20 MG tablet    Sig: TAKE 1 TABLET(20 MG) BY MOUTH DAILY    Dispense:  90 tablet    Refill:  1  . hydrochlorothiazide (HYDRODIURIL) 25 MG tablet    Sig: TAKE 1 TABLET(25 MG) BY  MOUTH DAILY    Dispense:  90 tablet    Refill:  1   Patient Instructions   No change in medications for now. Follow-up within 6 months for a physical and will recheck kidney function and cholesterol tests at that time. As we discussed working on portion size along with increasing exercise to 5 days per week as well as avoidance of sugar containing beverages including weet tea as much as possible should help with weight loss.     IF you received an x-ray today, you will receive an invoice from [facility]. Please contact [facility] at [phone] with questions or concerns regarding your invoice.   IF you received labwork today, you will receive an invoice from [place]. Please contact [facility] at [phone] with questions or concerns regarding your invoice.   Our billing staff will not be able to assist you with questions regarding bills from these companies.  You will be contacted with the lab results as soon as they are available. The fastest way to get your results is to activate your My Chart account. Instructions are located on the last page of this paperwork. If you have not heard from [place] regarding the results in 2 weeks, please contact this office.        I personally performed the services described in this documentation, which was scribed in my presence. The recorded information has been reviewed and considered, and addended by me as needed.   Signed,   Meredith Staggers, MD [facility] at [facility].  04/19/16 8:52 AM

## 2016-04-20 LAB — HEMOGLOBIN A1C
Est. average glucose Bld gHb Est-mCnc: 128 mg/dL
HEMOGLOBIN A1C: 6.1 % — AB (ref 4.8–5.6)

## 2016-04-24 ENCOUNTER — Encounter: Payer: Self-pay | Admitting: Emergency Medicine

## 2016-06-13 ENCOUNTER — Encounter: Admit: 2016-06-13 | Discharge: 2016-06-13 | Payer: BLUE CROSS/BLUE SHIELD | Attending: Family | Primary: Family

## 2016-06-13 DIAGNOSIS — 1 ERRONEOUS ENCOUNTER ICD10: Secondary | ICD-10-CM

## 2016-06-13 NOTE — Progress Notes (Signed)
Fish hook in hand since yesterday- left thumb.          Upon awaiting to discuss with dr. Ruthine Dosekulik and attempt to find wire cutters in the office to use for removal, patient states he wants to be sure it is done in one time and one place. I am unable to find wire cutters, went to his room, he is at the desk and said will go to er, leaving walkin clinic.    Case was discussed with dr. Ruthine Dosekulik

## 2016-06-21 ENCOUNTER — Ambulatory Visit
Admit: 2016-06-21 | Discharge: 2016-06-21 | Payer: BLUE CROSS/BLUE SHIELD | Attending: Internal Medicine | Primary: Family

## 2016-06-21 DIAGNOSIS — M25512 Pain in left shoulder: Secondary | ICD-10-CM

## 2016-06-21 MED ORDER — PREDNISONE 20 MG PO TABS
20 MG | ORAL_TABLET | ORAL | 0 refills | Status: DC
Start: 2016-06-21 — End: 2017-04-17

## 2016-06-21 NOTE — Progress Notes (Signed)
Visit Information    Have you changed or started any medications since your last visit including any over-the-counter medicines, vitamins, or herbal medicines? no   Are you having any side effects from any of your medications? -  no  Have you stopped taking any of your medications? Is so, why? -  no    Have you seen any other physician or provider since your last visit? No  Have you had any other diagnostic tests since your last visit? No  Have you been seen in the emergency room and/or had an admission to a hospital since we last saw you? No  Have you had your routine dental cleaning in the past 6 months? no    Have you activated your MyChart account? If not, what are your barriers? No: Pending     Patient Care Team:  Tanna Furry, CNP as PCP - General (Nurse Practitioner)  Alger Memos, MD    Medical History Review  Past Medical, Family, and Social History reviewed and does contribute to the patient presenting condition    Health Maintenance   Topic Date Due   ??? Shingles Vaccine (1 of 2 - 2 Dose Series) 12/28/1999   ??? Flu vaccine (1) 11/25/2015   ??? Pneumococcal low/med risk (2 of 2 - PPSV23) 12/01/2016   ??? A1C test (Diabetic or Prediabetic)  01/04/2017   ??? TSH testing  01/04/2017   ??? Lipid screen  01/04/2021   ??? DTaP/Tdap/Td vaccine (2 - Td) 09/30/2023   ??? Colon cancer screen colonoscopy  07/26/2025   ??? AAA screen  Completed   ??? Hepatitis C screen  Addressed

## 2016-06-21 NOTE — Progress Notes (Signed)
[facility] PHYSICIANS  [facility]  [address]  [place] [address]  Dept: [phone]  Dept Fax: [phone]    Dustin Zimmerman is a 66 y.o. male who presents today for his medical conditions/complaints as noted below.  Dustin Zimmerman is c/o of   Chief Complaint   Patient presents with   ??? Shoulder Pain     Had tetnus shot last week in left arm, and now shoulder is very sore, joint is very painful          HPI:     [facility] about 6-7 days ago . No issues with the shot or pain with /during the vaccination . 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 No real redness or swelling has not  Dropped anything because of it. Has not taken anything for the pain. But has been doing his home exercises at least once daily he does for his frozen shoulder which helps with movement but not with the pain. Has not noticed a lumpr or mass but does feel that arm is more taught or tighter than right arm . Has finished abx for Zimmerman hook injury and no issues with that any longer . No fever or chills.       Shoulder Pain    The pain is present in the left shoulder and left arm. This is a new problem. The current episode started in the past 7 days. There has been no history of extremity trauma. The problem occurs constantly. The problem has been waxing and waning. The pain is at a severity of 8/10. Associated symptoms include an inability to bear weight, a limited range of motion, stiffness and tingling. Pertinent negatives include no fever, itching, joint locking, joint swelling or numbness. The symptoms are aggravated by contact and standing. He has tried movement and rest  for the symptoms. The treatment provided mild relief. Family history does not include gout or rheumatoid arthritis. There is no history of diabetes, gout, osteoarthritis or rheumatoid arthritis.       Hemoglobin A1C (%)   Date Value   01/05/2016 5.8   07/02/2014 5.7   02/23/2013 5.9             ( goal A1C is < 7)   No results found for: LABMICR  LDL Cholesterol (mg/dL)   Date Value   65/78/4696 66     LDL Calculated (mg/dL)   Date Value   29/52/8413 124   06/12/2013 115       (goal LDL is <100)   AST (U/L)   Date Value   01/05/2016 22     ALT (U/L)   Date Value   01/05/2016 27     BUN (mg/dL)   Date Value   24/40/1027 20     BP Readings from Last 3 Encounters:   06/21/16 138/76   12/02/15 127/84   08/19/14 118/84          (goal 120/80)    Past Medical History:   Diagnosis Date   ??? Hypogonadism    ??? Hypothyroid    ???  Lumbago    ??? Malaise and fatigue    ??? Myalgia       Past Surgical History:   Procedure Laterality Date   ??? EYE SURGERY     ??? FOOT SURGERY      middle toe   ??? KNEE SURGERY     ??? SHOULDER SURGERY         Family History   Problem Relation Age of Onset   ??? Other Father      aspiration pneumonia       Social History   Substance Use Topics   ??? Smoking status: Former Smoker   ??? Smokeless tobacco: Never Used   ??? Alcohol use 0.0 oz/week      Comment: occ      Current Outpatient Prescriptions   Medication Sig Dispense Refill   ??? cephALEXin (KEFLEX) 500 MG capsule Take 500 mg by mouth     ??? predniSONE (DELTASONE) 20 MG tablet Take 2 tablets once daily in the morning for 5 days 10 tablet 0   ??? levothyroxine (SYNTHROID) 50 MCG tablet TAKE 1 TABLET DAILY 90 tablet 3   ??? dorzolamide-timolol (COSOPT) 22.3-6.8 MG/ML ophthalmic solution Place 2 drops into both eyes every 12 hours 1 Bottle 3   ??? tadalafil (CIALIS) 20 MG tablet Take 1 tablet by mouth as needed for Erectile Dysfunction 15 tablet 3   ??? triamcinolone (KENALOG) 0.1 % cream   4   ??? prednisoLONE acetate (PRED FORTE) 1 % ophthalmic suspension        No current  facility-administered medications for this visit.      Allergies   Allergen Reactions   ??? Motrin [Ibuprofen]    ??? Percocet [Oxycodone-Acetaminophen] Hives   ??? Motrin [Ibuprofen] Nausea And Vomiting     kidney   ??? Percocet [Oxycodone-Acetaminophen] Rash       Health Maintenance   Topic Date Due   ??? Shingles Vaccine (1 of 2 - 2 Dose Series) 12/28/1999   ??? Flu vaccine (1) 11/25/2015   ??? Pneumococcal low/med risk (2 of 2 - PPSV23) 12/01/2016   ??? A1C test (Diabetic or Prediabetic)  01/04/2017   ??? TSH testing  01/04/2017   ??? Lipid screen  01/04/2021   ??? DTaP/Tdap/Td vaccine (2 - Td) 09/30/2023   ??? Colon cancer screen colonoscopy  07/26/2025   ??? AAA screen  Completed   ??? Hepatitis C screen  Addressed       Subjective:      Review of Systems   Constitutional: Negative for fever.   Musculoskeletal: Positive for stiffness. Negative for gout.   Skin: Negative for itching.   Neurological: Positive for tingling. Negative for numbness.       Objective:     Physical Exam   Constitutional: No distress.   Cardiovascular: Normal rate and regular rhythm.    Pulmonary/Chest: Effort normal and breath sounds normal.   Musculoskeletal:        Left shoulder: He exhibits decreased range of motion, tenderness, swelling, crepitus, pain and spasm. He exhibits no bony tenderness, no effusion, no deformity, no laceration, normal pulse and normal strength.   Skin: He is not diaphoretic.     BP 138/76 (Site: Left Arm, Position: Sitting, Cuff Size: Large Adult)    Pulse 91    Temp 96.9 ??F (36.1 ??C) (Tympanic)    Resp 18    Ht 6' 0.01" (1.829 m)    Wt 207 lb (93.9 kg)    SpO2 95%  BMI 28.07 kg/m??     Assessment:      1. Acute pain of left shoulder  MRI Shoulder Left WO Contrast   2. Decreased range of motion of left shoulder  MRI Shoulder Left WO Contrast             Plan:      Return if symptoms worsen or fail to improve.    Orders Placed This Encounter   Procedures   ??? MRI Shoulder Left WO Contrast     Standing Status:   Future     Standing  Expiration Date:   06/21/2017     Order Specific Question:   Reason for exam:     Answer:   shoulder pain , muscle weakness ,  decreased range of motion     Orders Placed This Encounter   Medications   ??? predniSONE (DELTASONE) 20 MG tablet     Sig: Take 2 tablets once daily in the morning for 5 days     Dispense:  10 tablet     Refill:  0    Some limited range of motion on exam but otherwise normal   Trial prednisone burst.   Continue home exercises/therapy. Do heat and ice alternating.   Reviewed sometimes the tdap vaccine epically can be bothersome up to two weeks so may want to give it that time frame   Also will order MRI for further eval but did review if sxs completely resolve within the week pt should cancel follow up and MRI and let us know.   Call with questions or concerns.    Patient given educational materials - see patient instructions.  Discussed use, benefit, and side effects of prescribed medications.  All patient questions answered. Pt voiced understanding. Reviewed health maintenance.  Instructed to continue current medications, diet and exercise.  Patient agreed with treatment plan. Follow up as directed.     Electronically signed by Hal Morales, DO on 06/21/2016 at 10:30 PM

## 2016-07-03 ENCOUNTER — Ambulatory Visit: Payer: BLUE CROSS/BLUE SHIELD | Primary: Family

## 2016-07-16 NOTE — Telephone Encounter (Signed)
Patient was supposed to come back in and be seen anyways if his sxs persisted so I was assuming since there was no follow up that his sxs had resolved. I would just hold off unless pt makes an appt and sxs still on going

## 2016-07-16 NOTE — Telephone Encounter (Signed)
Angel from the [facility] pre cert department called stating patients insurance denied the MRI shoulder. He had an appt for Friday and they are going to call patient and cancel. They state you can do a peer to peer thru [employer]. Please advise. Thank you

## 2016-07-17 NOTE — Telephone Encounter (Signed)
Gave message to Carol

## 2016-07-17 NOTE — Telephone Encounter (Signed)
Dustin Zimmerman would like to speak to [name] regarding authorization for his mri, please return his call.  Thanks

## 2016-08-02 ENCOUNTER — Encounter

## 2016-10-02 ENCOUNTER — Encounter: Payer: Self-pay | Admitting: Family Medicine

## 2016-10-02 ENCOUNTER — Ambulatory Visit (INDEPENDENT_AMBULATORY_CARE_PROVIDER_SITE_OTHER): Payer: Medicare Other | Admitting: Family Medicine

## 2016-10-02 VITALS — BP 138/82 | HR 72 | Temp 97.5°F | Resp 16 | Ht 71.25 in | Wt 321.2 lb

## 2016-10-02 DIAGNOSIS — E785 Hyperlipidemia, unspecified: Secondary | ICD-10-CM

## 2016-10-02 DIAGNOSIS — Z6841 Body Mass Index (BMI) 40.0 and over, adult: Secondary | ICD-10-CM | POA: Diagnosis not present

## 2016-10-02 DIAGNOSIS — R7303 Prediabetes: Secondary | ICD-10-CM | POA: Diagnosis not present

## 2016-10-02 DIAGNOSIS — I10 Essential (primary) hypertension: Secondary | ICD-10-CM | POA: Diagnosis not present

## 2016-10-02 MED ORDER — SIMVASTATIN 80 MG PO TABS: ORAL_TABLET | ORAL | 1 refills | Status: DC

## 2016-10-02 MED ORDER — METOPROLOL SUCCINATE ER 100 MG PO TB24: 100.0000 mg | ORAL_TABLET | Freq: Every day | ORAL | 1 refills | Status: DC

## 2016-10-02 MED ORDER — LISINOPRIL-HYDROCHLOROTHIAZIDE 20-25 MG PO TABS: 1.0000 | ORAL_TABLET | Freq: Every day | ORAL | 1 refills | Status: DC
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
## 2016-10-02 NOTE — Patient Instructions (Addendum)
Try to cut back on sweet tea or mixing with unsweet tea to lessen sugar intake. Watch out for seconds and portion sizes as well. If you are having difficulty with weight loss I am happy to refer you to bariatric specialist or nutritionist. Initial goal of 15 pound weight loss would be reasonable.   No changes in doses of medications today, but as we discussed I did combine the lisinopril and hydrochlorothiazide into one combination pill. He can have the pharmacy put those on hold if needed until you run out of your current medications. Continue Zocor at same dose. Recheck in 6 months for wellness visit/physical.  Enjoy the rest of your summer.     IF you received an x-ray today, you will receive an invoice from [facility]. Please contact [facility] at [phone] with questions or concerns regarding your invoice.   IF you received labwork today, you will receive an invoice from [facility]. Please contact [facility] at [phone] with questions or concerns regarding your invoice.   Our billing staff will not be able to assist you with questions regarding bills from these companies.  You will be contacted with the lab results as soon as they are available. The fastest way to get your results is to activate your My Chart account. Instructions are located on the last page of this paperwork. If you have not heard from us regarding the results in 2 weeks, please contact this office.

## 2016-10-03 LAB — COMPREHENSIVE METABOLIC PANEL
A/G RATIO: 1.8 (ref 1.2–2.2)
ALK PHOS: 54 IU/L (ref 39–117)
ALT: 36 IU/L (ref 0–44)
AST: 22 IU/L (ref 0–40)
Albumin: 4.2 g/dL (ref 3.6–4.8)
BUN/Creatinine Ratio: 19 (ref 10–24)
BUN: 15 mg/dL (ref 8–27)
Bilirubin Total: 0.5 mg/dL (ref 0.0–1.2)
CO2: 25 mmol/L (ref 20–29)
Calcium: 9.5 mg/dL (ref 8.6–10.2)
Chloride: 104 mmol/L (ref 96–106)
Creatinine, Ser: 0.78 mg/dL (ref 0.76–1.27)
GFR calc Af Amer: 109 mL/min/{1.73_m2} (ref >59)
GFR calc non Af Amer: 94 mL/min/{1.73_m2} (ref >59)
GLOBULIN, TOTAL: 2.4 g/dL (ref 1.5–4.5)
Glucose: 113 mg/dL — ABNORMAL HIGH (ref 65–99)
POTASSIUM: 4.1 mmol/L (ref 3.5–5.2)
SODIUM: 147 mmol/L — AB (ref 134–144)
Total Protein: 6.6 g/dL (ref 6.0–8.5)

## 2016-10-03 LAB — LIPID PANEL
CHOLESTEROL TOTAL: 137 mg/dL (ref 100–199)
Chol/HDL Ratio: 4.4 ratio (ref 0.0–5.0)
HDL: 31 mg/dL — AB (ref >39)
LDL Calculated: 86 mg/dL (ref 0–99)
Triglycerides: 101 mg/dL (ref 0–149)
VLDL Cholesterol Cal: 20 mg/dL (ref 5–40)

## 2016-10-03 LAB — HEMOGLOBIN A1C
ESTIMATED AVERAGE GLUCOSE: 126 mg/dL
Hgb A1c MFr Bld: 6 % — ABNORMAL HIGH (ref 4.8–5.6)

## 2016-10-10 ENCOUNTER — Other Ambulatory Visit: Payer: Self-pay | Admitting: Family Medicine

## 2016-10-10 DIAGNOSIS — E870 Hyperosmolality and hypernatremia: Secondary | ICD-10-CM

## 2016-12-18 MED ORDER — LEVOTHYROXINE SODIUM 50 MCG PO TABS
50 | ORAL_TABLET | ORAL | 0 refills | Status: DC
Start: 2016-12-18 — End: 2017-04-04

## 2016-12-18 NOTE — Telephone Encounter (Signed)
Patient requesting medication refill be sent to [facility] as soon as possible. Any issues, please contact him at earliest convenience. Thank you.

## 2016-12-18 NOTE — Telephone Encounter (Signed)
LOV 06/21/2016

## 2017-02-12 ENCOUNTER — Ambulatory Visit: Admit: 2017-02-12 | Discharge: 2017-02-12 | Payer: BLUE CROSS/BLUE SHIELD | Attending: Family | Primary: Family

## 2017-02-12 ENCOUNTER — Ambulatory Visit: Admit: 2017-02-12 | Payer: BLUE CROSS/BLUE SHIELD | Primary: Family

## 2017-02-12 DIAGNOSIS — S6710XA Crushing injury of unspecified finger(s), initial encounter: Secondary | ICD-10-CM

## 2017-02-12 MED ORDER — GENTAMICIN SULFATE 0.3 % OP SOLN
0.3 % | OPHTHALMIC | 0 refills | Status: AC
Start: 2017-02-12 — End: 2017-02-19

## 2017-02-12 NOTE — Progress Notes (Signed)
[facility] PHYSICI[facility]  [facility]  [address]  [place] [address]  Dept: [phone]  Dept Fax: [phone]    Dustin Zimmerman is a 66 y.o. male who presents to the urgent care today for his medical conditions/complaints as notedbelow.  [name] is c/o of Finger Injury (right middle finger contusion happened this morning ) and Conjunctivitis (granddaughter was dx with pink eye  would like to be checked )      HPI:     66 year old male patient presents with complaints of right hand middle digit crush injury.  Around 5 AM this morning is working on his boat when the tongue of the boat came down and hit him in his right hand middle digit dorsum.  Denies any other injury.  States crush injury happened only seconds he did not have to pull his hand out and he only noticed a small abrasion with a little bit of bleeding.  Over the course of the day the finger has become more swollen and painful and he was concerned there may be some tendon involvement.  He denies any numbness or tingling and is able to flex and extend the finger without difficulty.  He denies any other injury. He is right hand dominant.  Tetanus is up-to-date.    He also was around his grandchild approximately 2 weeks ago and she was diagnosed with conjunctivitis.  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  He believes his eyes are little bit more red than usual with slight bit more yellowy drainage than usual.  Denies any eye pain light sensitivity for feeling of foreign body.  No cough or cold symptoms no fevers or chills no change in vision. Does not wear contact lenses      Conjunctivitis    The current episode started 5 to 7 days ago. The onset was gradual. The problem has been gradually worsening. The problem is mild. Nothing relieves the symptoms. Nothing aggravates the symptoms. Associated symptoms  include eye discharge and eye redness. Pertinent negatives include no fever, no decreased vision, no double vision, no eye itching, no photophobia, no congestion, no ear discharge, no ear pain, no headaches, no hearing loss, no mouth sores, no rhinorrhea, no sore throat, no stridor, no swollen glands, no URI and no eye pain. Severity: denies pain. Both eyes are affected.The eyelid exhibits no abnormality. He has been behaving normally. He has been eating and drinking normally. There were sick contacts at home.   Hand Injury    The incident occurred 6 to 12 hours ago. The incident occurred at home. The injury mechanism was a direct blow. The pain is present in the right fingers. The quality of the pain is described as aching. The pain does not radiate. The pain is at a severity of 5/10. The pain is mild. The pain has been constant since the incident. Pertinent negatives include no muscle weakness, numbness or tingling. The symptoms are aggravated by movement and palpation. He has tried nothing for the symptoms. The treatment provided no relief.       Past Medical History:   Diagnosis Date   . Hypogonadism    . Hypothyroid    . Lumbago    . Malaise and fatigue    . Myalgia         Current Outpatient Prescriptions   Medication Sig Dispense  Refill   . gentamicin (GARAMYCIN) 0.3 % ophthalmic solution Place 1 drop into both eyes every 4 hours for 7 days 1 Bottle 0   . levothyroxine (SYNTHROID) 50 MCG tablet TAKE 1 TABLET DAILY 90 tablet 0   . dorzolamide-timolol (COSOPT) 22.3-6.8 MG/ML ophthalmic solution Place 2 drops into both eyes every 12 hours 1 Bottle 3   . tadalafil (CIALIS) 20 MG tablet Take 1 tablet by mouth as needed for Erectile Dysfunction 15 tablet 3   . valACYclovir (VALTREX) 500 MG tablet Take 1 tablet by mouth     . predniSONE (DELTASONE) 20 MG tablet Take 2 tablets once daily in the morning for 5 days 10 tablet 0   . triamcinolone (KENALOG) 0.1 % cream   4   . prednisoLONE acetate (PRED FORTE) 1 %  ophthalmic suspension        No current facility-administered medications for this visit.      Allergies   Allergen Reactions   . Motrin [Ibuprofen] Other (See Comments)     kidney   . Percocet [Oxycodone-Acetaminophen] Hives   . Motrin [Ibuprofen] Nausea And Vomiting     kidney   . Percocet [Oxycodone-Acetaminophen] Rash       Subjective:      Review of Systems   Constitutional: Negative for fever.   HENT: Negative for congestion, ear discharge, ear pain, hearing loss, mouth sores, rhinorrhea and sore throat.    Eyes: Positive for discharge and redness. Negative for double vision, photophobia, pain and itching.   Respiratory: Negative for stridor.    Neurological: Negative for tingling, numbness and headaches.   All other systems reviewed and are negative.    14 systems reviewed and negative except as listed in HPI.    Objective:     Physical Exam   Constitutional: He is oriented to person, place, and time. He appears well-developed and well-nourished. No distress.   Well appearing, nontoxic   HENT:   Head: Normocephalic and atraumatic.     Handles oral secretions without difficulty   Eyes: Pupils are equal, round, and reactive to light. EOM and lids are normal. Right eye exhibits discharge. Right eye exhibits no chemosis, no exudate and no hordeolum. No foreign body present in the right eye. Left eye exhibits discharge. Left eye exhibits no chemosis, no exudate and no hordeolum. No foreign body present in the left eye. Right conjunctiva is injected. Right conjunctiva has no hemorrhage. Left conjunctiva is injected. Left conjunctiva has no hemorrhage. No scleral icterus. Right eye exhibits normal extraocular motion and no nystagmus. Left eye exhibits normal extraocular motion and no nystagmus. Right pupil is round and reactive. Left pupil is round and reactive. Pupils are equal.   Cardiovascular: Normal rate, regular rhythm, normal heart sounds and intact distal pulses.    No murmur heard.  Pulmonary/Chest: Effort  normal and breath sounds normal. No respiratory distress. He has no wheezes. He has no rales.   Musculoskeletal: Normal range of motion. He exhibits tenderness. He exhibits no edema or deformity.   Superficial 2 cm abrasion and generalized swelling noted to right hand middle digit.  Abrasion located between MCP and PIP joint on dorsum.  No bleeding noted  Patient able to fully flex and extend middle finger and all fingers against resistance without difficulty.  No bony deformity, all compartments soft.  No other evidence of injury noted.   Neurological: He is alert and oriented to person, place, and time. No sensory deficit. He exhibits normal muscle tone.  Coordination normal.   Sensation equal and intact to bilateral upper extremities.   Skin: Skin is warm and dry. Capillary refill takes less than 2 seconds. No rash noted. He is not diaphoretic.   Psychiatric: He has a normal mood and affect. His behavior is normal.   Nursing note and vitals reviewed.    BP 128/78 (Site: Left Upper Arm, Position: Sitting, Cuff Size: Medium Adult)   Pulse 68   Temp 97.8 F (36.6 C) (Tympanic)   Resp 18   Ht 6' (1.829 m)   Wt 211 lb (95.7 kg)   SpO2 98%   BMI 28.62 kg/m     Assessment:       Diagnosis Orders   1. Crushing injury of finger, right  XR HAND RIGHT (MIN 3 VIEWS)   2. Conjunctivitis of both eyes, unspecified conjunctivitis type         Plan:    rt hand xray neg acuity.   The radiologist called from [place] and spoke with me states looks like possible old fracture to the second digit DIP joint.  I went back into the room and reexamined patient and  Patient has no pain or symptoms to any of his fingers but middle digit.  He will follow up with family doctor in 3-4 days for recheck.    He also has exposure to conjunctivitis  2 weeks ago.  Both eyes are little injected and have some drainage.  He is unsure if they look any different or having more drainage than normal.  Because He has a history of bilateral corneal  transplants.  I will write a prescription for gentamicin eyedrops but patient is to call his eye doctor and see if they want to see him first or if it's okay for him to start gentamicin eyedrops.  I printed out prescription given to him.  Patient agreeable.    Patient is due for hemoglobin A1c and some vaccinations but he states he has his physical scheduled with [facility] in January and does not want any of those tests done or vaccines given until he has his physical.  Return for Make an Appointment with your Primary Care and eye doctor.    Orders Placed This Encounter   Medications   . gentamicin (GARAMYCIN) 0.3 % ophthalmic solution     Sig: Place 1 drop into both eyes every 4 hours for 7 days     Dispense:  1 Bottle     Refill:  0         Patient given educational materials - see patient instructions.  Discussed use, benefit, and side effects of prescribed medications.  All patient questions answered.  Pt voicedunderstanding.    Electronically signed by Jaci CarrelBrenda Korianna Washer, APRN - CNP on 02/12/2017 at 4:46 PM

## 2017-02-12 NOTE — Patient Instructions (Addendum)
Good handwashing    Return worse  Ice/elevate  Follow up with your eye doctor immediately, call office today and notify of exposure, see if want to see you and if ok to start gentamycin drops  Patient Education        Hand Bruises: Care Instructions  Your Care Instructions  Bruises, or contusions, can happen as a result of an impact or fall. Most people think of a bruise as a black-and-blue spot. This happens when small blood vessels get torn and leak blood under the skin. The bruise may turn purplish black, reddish blue, or yellowish green as it heals. But bones and muscles can also get bruised. This may damage the hand but not cause a bruise that you can see.  Most bruises aren't serious and will go away on their own in 2 to 4 weeks. But sometimes a more serious hand injury might not heal on its own. Tell your doctor if you have new symptoms or your injury is not getting better over time. You may have tests to see if you have bone or nerve damage. These tests may include X-rays, a CT scan, or an MRI. If you damaged bones or muscles, you may need more treatment.  The doctor has checked you carefully, but problems can develop later. If you notice any problems or new symptoms, get medical treatment right away.  Follow-up care is a key part of your treatment and safety. Be sure to make and go to all appointments, and call your doctor if you are having problems. It's also a good idea to know your test results and keep a list of the medicines you take.  How can you care for yourself at home?   Put ice or a cold pack on the hand for 10 to 20 minutes at a time. Put a thin cloth between the ice and your skin.   Prop up your hand on a pillow when you ice it or anytime you sit or lie down during the next 3 days. Try to keep your hand above the level of your heart. This will help reduce swelling.   Be safe with medicines. Read and follow all instructions on the label.  ? If the doctor gave you a prescription medicine for  pain, take it as prescribed.  ? If you are not taking a prescription pain medicine, ask your doctor if you can take an over-the-counter medicine.   Be sure to follow your doctor's advice about moving and exercising your injured hand.  When should you call for help?  Call your doctor now or seek immediate medical care if:    Your pain gets worse.     You have new or worse swelling.     You have tingling, weakness, or numbness in the area near the bruise.     The area near the bruise is cold or pale.     You have symptoms of infection, such as:  ? Increased pain, swelling, warmth, or redness.  ? Red streaks leading from the area.  ? Pus draining from the area.  ? A fever.   Watch closely for changes in your health, and be sure to contact your doctor if:    You do not get better as expected.   Where can you learn more?  Go to [email or web address] and sign in to your MyChart account. Enter [phone] in the Search Health Information box to learn more about "Hand Bruises: Care Instructions."     If  you do not have an account, please click on the "Sign Up Now" link.  Current as of: February 13, 2016  Content Version: 11.8   2006-2018 [employer]. Care instructions adapted under license by [facility]. If you have questions about a medical condition or this instruction, always ask your healthcare professional. [employer] disclaims any warranty or liability for your use of this information.         Patient Education        Pinkeye: Care Instructions  Your Care Instructions    Pinkeye is redness and swelling of the eye surface and the conjunctiva (the lining of the eyelid and the covering of the white part of the eye). Pinkeye is also called conjunctivitis. Pinkeye is often caused by infection with bacteria or a virus. Dry air, allergies, smoke, and chemicals are other common causes.  Pinkeye often clears on its own in 7 to 10 days. Antibiotics only help if the pinkeye  is caused by bacteria. Pinkeye caused by infection spreads easily. If an allergy or chemical is causing pinkeye, it will not go away unless you can avoid whatever is causing it.  Follow-up care is a key part of your treatment and safety. Be sure to make and go to all appointments, and call your doctor if you are having problems. It's also a good idea to know your test results and keep a list of the medicines you take.  How can you care for yourself at home?   Wash your hands often. Always wash them before and after you treat pinkeye or touch your eyes or face.   Use moist cotton or a clean, wet cloth to remove crust. Wipe from the inside corner of the eye to the outside. Use a clean part of the cloth for each wipe.   Put cold or warm wet cloths on your eye a few times a day if the eye hurts.   Do not wear contact lenses or eye makeup until the pinkeye is gone. Throw away any eye makeup you were using when you got pinkeye. Clean your contacts and storage case. If you wear disposable contacts, use a new pair when your eye has cleared and it is safe to wear contacts again.   If the doctor gave you antibiotic ointment or eyedrops, use them as directed. Use the medicine for as long as instructed, even if your eye starts looking better soon. Keep the bottle tip clean, and do not let it touch the eye area.   To put in eyedrops or ointment:  ? Tilt your head back, and pull your lower eyelid down with one finger.  ? Drop or squirt the medicine inside the lower lid.  ? Close your eye for 30 to 60 seconds to let the drops or ointment move around.  ? Do not touch the ointment or dropper tip to your eyelashes or any other surface.   Do not share towels, pillows, or washcloths while you have pinkeye.  When should you call for help?  Call your doctor now or seek immediate medical care if:    You have pain in your eye, not just irritation on the surface.     You have a change in vision or loss of vision.     You have  an increase in discharge from the eye.     Your eye has not started to improve or begins to get worse within 48 hours after you start using antibiotics.  Pinkeye lasts longer than 7 days.   Watch closely for changes in your health, and be sure to contact your doctor if you have any problems.  Where can you learn more?  Go to [email or web address] and sign in to your MyChart account. Enter Y392 in the Search Health Information box to learn more about "Pinkeye: Care Instructions."     If you do not have an account, please click on the "Sign Up Now" link.  Current as of: February 13, 2016  Content Version: 11.8   2006-2018 [employer]. Care instructions adapted under license by [facility]. If you have questions about a medical condition or this instruction, always ask your healthcare professional. [employer] disclaims any warranty or liability for your use of this information.

## 2017-03-25 ENCOUNTER — Ambulatory Visit: Admit: 2017-03-25 | Discharge: 2017-03-25 | Payer: BLUE CROSS/BLUE SHIELD | Attending: Family | Primary: Family

## 2017-03-25 DIAGNOSIS — J209 Acute bronchitis, unspecified: Secondary | ICD-10-CM

## 2017-03-25 MED ORDER — BENZONATATE 100 MG PO CAPS
100 MG | ORAL_CAPSULE | Freq: Three times a day (TID) | ORAL | 0 refills | Status: DC | PRN
Start: 2017-03-25 — End: 2017-04-17

## 2017-03-25 MED ORDER — AZITHROMYCIN 250 MG PO TABS
250 MG | PACK | ORAL | 0 refills | Status: DC
Start: 2017-03-25 — End: 2017-04-17

## 2017-03-25 NOTE — Progress Notes (Signed)
[facility]  [facility]  [address]  [place] [id number]  Dept: [phone]  Dept Fax: [phone]    Dustin BartersJohn O Williamsis a 66 y.o. male who presents to the urgent care today for his medical conditions/complaintsas noted below.  Dustin StallJohn O Zimmerman is c/o of Cough (chest congestion and cough for 2 weeks now  had a fever for the first week now woke today with sinus congestion ); Chest Congestion; Pharyngitis; and Sinusitis      HPI:     Cough for 2 weeks  Had fever initially, gone after only a couple of days  Using otc cold meds mild relief  Has nasal congestion        Cough   This is a new problem. The current episode started 1 to 4 weeks ago. The problem has been gradually worsening. The cough is productive of sputum. Associated symptoms include nasal congestion and a sore throat. Pertinent negatives include no chest pain or fever. The symptoms are aggravated by lying down. He has tried OTC cough suppressant (nasal spray) for the symptoms.   Pharyngitis   Associated symptoms include congestion, coughing and a sore throat. Pertinent negatives include no chest pain, fever, vomiting or weakness.   Sinusitis   Associated symptoms include congestion, coughing, sinus pressure and a sore throat.       Past Medical History:   Diagnosis Date   . Hypogonadism    . Hypothyroid    . Lumbago    . Malaise and fatigue    . Myalgia        Current Outpatient Prescriptions   Medication Sig Dispense Refill   . azithromycin (ZITHROMAX) 250 MG tablet Take 2 tabs (500 mg) on Day 1, and take 1 tab (250 mg) on days 2 through 5. 1 packet 0   . benzonatate (TESSALON PERLES) 100 MG capsule Take 1 capsule by mouth 3 times daily as needed for Cough 20 capsule 0   . valACYclovir (VALTREX) 500 MG tablet Take 1 tablet by mouth     . levothyroxine (SYNTHROID) 50 MCG tablet TAKE 1 TABLET DAILY 90 tablet 0   . predniSONE (DELTASONE) 20 MG tablet Take 2 tablets once daily in the morning for 5 days  10 tablet 0   . dorzolamide-timolol (COSOPT) 22.3-6.8 MG/ML ophthalmic solution Place 2 drops into both eyes every 12 hours 1 Bottle 3   . tadalafil (CIALIS) 20 MG tablet Take 1 tablet by mouth as needed for Erectile Dysfunction 15 tablet 3   . triamcinolone (KENALOG) 0.1 % cream   4   . prednisoLONE acetate (PRED FORTE) 1 % ophthalmic suspension        No current facility-administered medications for this visit.      Allergies   Allergen Reactions   . Motrin [Ibuprofen] Other (See Comments)     kidney   . Percocet [Oxycodone-Acetaminophen] Hives   . Motrin [Ibuprofen] Nausea And Vomiting     kidney   . Percocet [Oxycodone-Acetaminophen] Rash       Subjective:     Review of Systems   Constitutional: Negative for fever.   HENT: Positive for congestion, sinus pressure and sore throat.    Eyes: Negative for photophobia and visual disturbance.   Respiratory: Positive for cough.    Cardiovascular: Negative for chest pain.   Gastrointestinal: Negative for diarrhea and vomiting.   Neurological: Negative for dizziness and weakness.   All other systems reviewed and are negative.  Objective:     Physical Exam   Constitutional: He is oriented to person, place, and time. He appears well-developed and well-nourished. No distress.   HENT:   Head: Normocephalic and atraumatic.   Right Ear: Tympanic membrane normal.   Left Ear: Tympanic membrane normal.   Nose: Mucosal edema present. Right sinus exhibits no maxillary sinus tenderness and no frontal sinus tenderness. Left sinus exhibits no maxillary sinus tenderness and no frontal sinus tenderness.   Mouth/Throat: Uvula is midline and mucous membranes are normal. Posterior oropharyngeal erythema present.   Eyes: Right eye exhibits no discharge. Left eye exhibits no discharge. No scleral icterus.   Neck: Normal range of motion. Neck supple.   Cardiovascular: Normal rate, regular rhythm and normal heart sounds.    No murmur heard.  Pulmonary/Chest: Effort normal. No respiratory  distress.   Abdominal: Soft. Bowel sounds are normal. There is no tenderness.   Musculoskeletal: Normal range of motion. He exhibits no edema.   Neurological: He is alert and oriented to person, place, and time. No cranial nerve deficit.   Skin: Skin is warm and dry. No rash noted. He is not diaphoretic.   Psychiatric: He has a normal mood and affect. His behavior is normal.   Nursing note and vitals reviewed.    BP 126/74 (Site: Left Upper Arm, Position: Sitting, Cuff Size: Medium Adult)   Pulse 76   Temp 97.6 F (36.4 C) (Tympanic)   Resp 18   Ht 6\' 1"  (1.854 m)   Wt 215 lb (97.5 kg)   SpO2 97%   BMI 28.37 kg/m     Assessment:       Diagnosis Orders   1. Acute bronchitis, unspecified organism  azithromycin (ZITHROMAX) 250 MG tablet    benzonatate (TESSALON PERLES) 100 MG capsule       Plan:    Push fluids  claritin and flonase as directed  Recheck for worsening, change or concern  Follow up with primary care in one week= need to discuss health maintenance due (states he has physical scheduled in 3 weeks to go over all of this)    Return for follow up with primary care in 7 days, worsening, change or concern.    Orders Placed This Encounter   Medications   . azithromycin (ZITHROMAX) 250 MG tablet     Sig: Take 2 tabs (500 mg) on Day 1, and take 1 tab (250 mg) on days 2 through 5.     Dispense:  1 packet     Refill:  0   . benzonatate (TESSALON PERLES) 100 MG capsule     Sig: Take 1 capsule by mouth 3 times daily as needed for Cough     Dispense:  20 capsule     Refill:  0         Patient given educational materials - see patientinstructions.  Discussed use, benefit, and side effects of prescribed medications.All patient questions answered.  Pt voiced understanding.    Electronically signed by Dustin CarneyKelly L Tarryn Bogdan, APRN - CNPon 03/25/2017 at 8:28 AM

## 2017-03-25 NOTE — Patient Instructions (Addendum)
Push fluids  claritin and flonase as directed  Recheck for worsening, change or concern  Follow up with primary care in one week= need to discuss health maintenance due  Patient Education        Bronchitis: Care Instructions  Your Care Instructions    Bronchitis is inflammation of the bronchial tubes, which carry air to the lungs. The tubes swell and produce mucus, or phlegm. The mucus and inflamed bronchial tubes make you cough. You may have trouble breathing.  Most cases of bronchitis are caused by viruses like those that cause colds. Antibiotics usually do not help and they may be harmful.  Bronchitis usually develops rapidly and lasts about 2 to 3 weeks in otherwise healthy people.  Follow-up care is a key part of your treatment and safety. Be sure to make and go to all appointments, and call your doctor if you are having problems. It's also a good idea to know your test results and keep a list of the medicines you take.  How can you care for yourself at home?   Take all medicines exactly as prescribed. Call your doctor if you think you are having a problem with your medicine.   Get some extra rest.   Take an over-the-counter pain medicine, such as acetaminophen (Tylenol), ibuprofen (Advil, Motrin), or naproxen (Aleve) to reduce fever and relieve body aches. Read and follow all instructions on the label.   Do not take two or more pain medicines at the same time unless the doctor told you to. Many pain medicines have acetaminophen, which is Tylenol. Too much acetaminophen (Tylenol) can be harmful.   Take an over-the-counter cough medicine that contains dextromethorphan to help quiet a dry, hacking cough so that you can sleep. Avoid cough medicines that have more than one active ingredient. Read and follow all instructions on the label.   Breathe moist air from a humidifier, hot shower, or sink filled with hot water. The heat and moisture will thin mucus so you can cough it out.   Do not smoke. Smoking can  make bronchitis worse. If you need help quitting, talk to your doctor about stop-smoking programs and medicines. These can increase your chances of quitting for good.  When should you call for help?  Call 911 anytime you think you may need emergency care. For example, call if:    You have severe trouble breathing.   Call your doctor now or seek immediate medical care if:    You have new or worse trouble breathing.     You cough up dark brown or bloody mucus (sputum).     You have a new or higher fever.     You have a new rash.   Watch closely for changes in your health, and be sure to contact your doctor if:    You cough more deeply or more often, especially if you notice more mucus or a change in the color of your mucus.     You are not getting better as expected.   Where can you learn more?  Go to [email or web address] and sign in to your MyChart account. Enter H333 in the Search Health Information box to learn more about "Bronchitis: Care Instructions."     If you do not have an account, please click on the "Sign Up Now" link.  Current as of: February 29, 2016  Content Version: 11.8   2006-2018 [employer]. Care instructions adapted under license by [facility]. If you have questions about  a medical condition or this instruction, always ask your healthcare professional. [employer] disclaims any warranty or liability for your use of this information.

## 2017-04-03 ENCOUNTER — Telehealth: Payer: Self-pay | Admitting: Family Medicine

## 2017-04-03 NOTE — Telephone Encounter (Signed)
Tried to call pt to remind them of their appt tomorrow 04/04/17 °

## 2017-04-04 ENCOUNTER — Other Ambulatory Visit: Payer: Self-pay

## 2017-04-04 ENCOUNTER — Ambulatory Visit: Payer: Medicare Other | Admitting: Family Medicine

## 2017-04-04 ENCOUNTER — Encounter: Payer: Self-pay | Admitting: Family Medicine

## 2017-04-04 VITALS — BP 132/82 | HR 72 | Temp 98.0°F | Resp 16 | Ht 72.05 in | Wt 325.0 lb

## 2017-04-04 DIAGNOSIS — I10 Essential (primary) hypertension: Secondary | ICD-10-CM | POA: Diagnosis not present

## 2017-04-04 DIAGNOSIS — Z23 Encounter for immunization: Secondary | ICD-10-CM

## 2017-04-04 DIAGNOSIS — R7303 Prediabetes: Secondary | ICD-10-CM | POA: Diagnosis not present

## 2017-04-04 DIAGNOSIS — E785 Hyperlipidemia, unspecified: Secondary | ICD-10-CM

## 2017-04-04 MED ORDER — LEVOTHYROXINE SODIUM 50 MCG PO TABS
50 | ORAL_TABLET | ORAL | 0 refills | Status: DC
Start: 2017-04-04 — End: 2017-05-02

## 2017-04-04 MED ORDER — SIMVASTATIN 80 MG PO TABS: ORAL_TABLET | ORAL | 1 refills | Status: DC

## 2017-04-04 MED ORDER — LISINOPRIL-HYDROCHLOROTHIAZIDE 20-25 MG PO TABS: 1.0000 | ORAL_TABLET | Freq: Every day | ORAL | 1 refills | Status: DC

## 2017-04-04 MED ORDER — METOPROLOL SUCCINATE ER 100 MG PO TB24: 100.0000 mg | ORAL_TABLET | Freq: Every day | ORAL | 1 refills | Status: DC

## 2017-04-04 NOTE — Progress Notes (Signed)
Subjective:  By signing my name below, I, Malik [name], attest that this documentation has been prepared under the direction and in the presence of Malik StaggersJeffrey Aylla Huffine, MD. Electronically Signed: Stann Oresung-Kai [name], Scribe. 04/04/2017 , 8:46 AM .  Patient was seen in Room 11 .   Patient ID: Malik GranaJimmy W [name], male    DOB: 11/26/1950, 67 y.o.   MRN: [id number] Chief Complaint  Patient presents with  . Hypertension    6 month follow-up   . Hyperlipidemia   HPI Malik GranaJimmy W [name] is a [age] y.o. male Here for follow up. He is fasting this morning.   HTN Lab Results  Component Value Date   CREATININE 0.78 10/02/2016   He takes Lisinopril-HCTZ 20-25mg  QD and Toprol 100mg  QD. He denies any complications. He mentions rare missed doses. He mentions checking his BP outside of office occasionally; recently [place] his BP [place] the fire station 126/88.   Hyperlipidemia Lab Results  Component Value Date   CHOL 137 10/02/2016   HDL 31 (L) 10/02/2016   LDLCALC 86 10/02/2016   TRIG 101 10/02/2016   CHOLHDL 4.4 10/02/2016   Lab Results  Component Value Date   ALT 36 10/02/2016   AST 22 10/02/2016   ALKPHOS 54 10/02/2016   BILITOT 0.5 10/02/2016   He takes Zocor 80mg  QD. He denies any side effects.   Obesity with pre-diabetes Lab Results  Component Value Date   HGBA1C 6.0 (H) 10/02/2016   Wt Readings from Last 3 Encounters:  04/04/17 (!) 325 lb (147.4 kg)  10/02/16 (!) 321 lb 3.2 oz (145.7 kg)  04/19/16 (!) 321 lb 3.2 oz (145.7 kg)   Body mass index is 44.02 kg/m.   Discussed avoidance of sugar containing beverages, diet and exercise at last visit. Patient declined bariatrics evaluation at last visit.   Diet: He mentions decreased intake of bread. He also notes decreased sweet tea. He states he started a diet recently, eating better and healthier foods.   Exercise: He's going to the gym.   Health Maintenance He received flu shot today.   Patient Active Problem List   Diagnosis Date  Noted  . Solitary pulmonary nodule 05/13/2014  . Aortic stenosis, moderate 01/06/2013  . Murmur 11/17/2012  . Dyspnea 11/17/2012  . Hypertension 12/18/2011  . Hyperglycemia 12/18/2011  . Hyperlipidemia 12/18/2011  . Arthritis 12/18/2011  . Obesity 12/18/2011   Past Medical History:  Diagnosis Date  . Aortic stenosis   . Arthritis   . Hyperglycemia   . Hyperlipidemia   . Hypertension   . Obesity    Past Surgical History:  Procedure Laterality Date  . APPENDECTOMY     No Known Allergies Prior to Admission medications   Medication Sig Start Date End Date Taking? Authorizing Provider  aspirin 325 MG EC tablet Take 325 mg by mouth daily.    [provider]  lisinopril-hydrochlorothiazide (PRINZIDE,ZESTORETIC) 20-25 MG tablet Take 1 tablet by mouth daily. 10/02/16   Shade FloodGreene, Yajayra Feldt R, MD  metoprolol succinate (TOPROL-XL) 100 MG 24 hr tablet Take 1 tablet (100 mg total) by mouth daily. Take with or immediately following a meal. 10/02/16   Shade FloodGreene, Fredi Geiler R, MD  simvastatin (ZOCOR) 80 MG tablet TAKE 1 TABLET(80 MG) BY MOUTH AT BEDTIME 10/02/16   Shade FloodGreene, Rhiannon Sassaman R, MD   Social History   Socioeconomic History  . Marital status: Divorced    Spouse name: Not on file  . Number of children: Not on file  . Years of education: Not on  file  . Highest education level: Not on file  Social Needs  . Financial resource strain: Not on file  . Food insecurity - worry: Not on file  . Food insecurity - inability: Not on file  . Transportation needs - medical: Not on file  . Transportation needs - non-medical: Not on file  Occupational History  . Not on file  Tobacco Use  . Smoking status: Former Smoker    Years: 20.00    Types: Cigarettes, Cigars    Last attempt to quit: 08/19/1978    Years since quitting: 38.6  . Smokeless tobacco: Never Used  Substance and Sexual Activity  . Alcohol use: No  . Drug use: No  . Sexual activity: Not on file  Other Topics Concern  . Not on file    Social History Narrative  . Not on file   Review of Systems  Constitutional: Negative for fatigue and unexpected weight change.  Eyes: Negative for visual disturbance.  Respiratory: Negative for cough, chest tightness and shortness of breath.   Cardiovascular: Negative for chest pain, palpitations and leg swelling.  Gastrointestinal: Negative for abdominal pain and blood in stool.  Neurological: Negative for dizziness, light-headedness and headaches.       Objective:   Physical Exam  Constitutional: He is oriented to person, place, and time. He appears well-developed and well-nourished.  HENT:  Head: Normocephalic and atraumatic.  Eyes: EOM are normal. Pupils are equal, round, and reactive to light.  Neck: No JVD present. Carotid bruit is not present.  Cardiovascular: Normal rate and regular rhythm.  Murmur heard.  Systolic murmur is present with a grade of 3/6. Pulmonary/Chest: Effort normal and breath sounds normal. He has no rales.  Musculoskeletal: He exhibits edema (trace pedal).  Neurological: He is alert and oriented to person, place, and time.  Skin: Skin is warm and dry.  Psychiatric: He has a normal mood and affect.  Vitals reviewed.   Vitals:   04/04/17 0823  BP: 132/82  Pulse: 72  Resp: 16  Temp: 98 F (36.7 C)  TempSrc: Oral  SpO2: 97%  Weight: (!) 325 lb (147.4 kg)  Height: 6' 0.05" (1.83 m)      Assessment & Plan:  [name] is a 67 y.o. male Essential hypertension - Plan: Comprehensive metabolic panel, metoprolol succinate (TOPROL-XL) 100 MG 24 hr tablet, lisinopril-hydrochlorothiazide (PRINZIDE,ZESTORETIC) 20-25 MG tablet  - controlled. Continue same doses. Labs pending.   Need for prophylactic vaccination and inoculation against influenza - Plan: Flu Vaccine QUAD 36+ mos IM   Prediabetes - Plan: Hemoglobin A1c  - with obesity. Trying new diet. Option of nutrition eval or bariatric specialist if needed. Continue exercise. recheck in 3  months.     Hyperlipidemia, unspecified hyperlipidemia type - Plan: Comprehensive metabolic panel, Lipid panel, simvastatin (ZOCOR) 80 MG tablet  - tolerating Zocor.  Continue same dose, labs pending. Recheck in 3 months.   Meds ordered this encounter  Medications  . simvastatin (ZOCOR) 80 MG tablet    Sig: TAKE 1 TABLET(80 MG) BY MOUTH AT BEDTIME    Dispense:  90 tablet    Refill:  1  . metoprolol succinate (TOPROL-XL) 100 MG 24 hr tablet    Sig: Take 1 tablet (100 mg total) by mouth daily. Take with or immediately following a meal.    Dispense:  90 tablet    Refill:  1  . lisinopril-hydrochlorothiazide (PRINZIDE,ZESTORETIC) 20-25 MG tablet    Sig: Take 1 tablet by mouth daily.  Dispense:  90 tablet    Refill:  1   Patient Instructions    Keep up the good work with improved diet and exercise. Recheck in 3 months for physical and recheck weight. Let me know if there are questions in the meantime.   Thanks for coming in today.    IF you received an x-ray today, you will receive an invoice from [facility]. Please contact [facility] at [phone] with questions or concerns regarding your invoice.   IF you received labwork today, you will receive an invoice from [facility]. Please contact [facility] at [phone] with questions or concerns regarding your invoice.   Our billing staff will not be able to assist you with questions regarding bills from these companies.  You will be contacted with the lab results as soon as they are available. The fastest way to get your results is to activate your My Chart account. Instructions are located on the last page of this paperwork. If you have not heard from [place] regarding the results in 2 weeks, please contact this office.       I personally performed the services described in this documentation, which was scribed in my presence. The recorded information has been reviewed and considered for accuracy and completeness,  addended by me as needed, and agree with information above.  Signed,   Malik Staggers, MD [facility].  04/04/17 9:06 AM

## 2017-04-04 NOTE — Patient Instructions (Addendum)
  Keep up the good work with improved diet and exercise. Recheck in 3 months for physical and recheck weight. Let me know if there are questions in the meantime.   Thanks for coming in today.    IF you received an x-ray today, you will receive an invoice from [facility]. Please contact [facility] at [phone] with questions or concerns regarding your invoice.   IF you received labwork today, you will receive an invoice from [facility]. Please contact [facility] at [phone] with questions or concerns regarding your invoice.   Our billing staff will not be able to assist you with questions regarding bills from these companies.  You will be contacted with the lab results as soon as they are available. The fastest way to get your results is to activate your My Chart account. Instructions are located on the last page of this paperwork. If you have not heard from us regarding the results in 2 weeks, please contact this office.

## 2017-04-04 NOTE — Telephone Encounter (Signed)
Pt needs f/u appt asap

## 2017-04-04 NOTE — Telephone Encounter (Signed)
LV  06/21/16

## 2017-04-04 NOTE — Telephone Encounter (Signed)
appt 04/15/17

## 2017-04-05 LAB — LIPID PANEL
CHOL/HDL RATIO: 4.5 ratio (ref 0.0–5.0)
Cholesterol, Total: 139 mg/dL (ref 100–199)
HDL: 31 mg/dL — ABNORMAL LOW (ref >39)
LDL CALC: 88 mg/dL (ref 0–99)
Triglycerides: 98 mg/dL (ref 0–149)
VLDL Cholesterol Cal: 20 mg/dL (ref 5–40)

## 2017-04-05 LAB — COMPREHENSIVE METABOLIC PANEL
A/G RATIO: 1.6 (ref 1.2–2.2)
ALBUMIN: 4.4 g/dL (ref 3.6–4.8)
ALK PHOS: 57 IU/L (ref 39–117)
ALT: 40 IU/L (ref 0–44)
AST: 27 IU/L (ref 0–40)
BUN / CREAT RATIO: 17 (ref 10–24)
BUN: 17 mg/dL (ref 8–27)
Bilirubin Total: 0.5 mg/dL (ref 0.0–1.2)
CALCIUM: 9.9 mg/dL (ref 8.6–10.2)
CO2: 26 mmol/L (ref 20–29)
CREATININE: 0.99 mg/dL (ref 0.76–1.27)
Chloride: 101 mmol/L (ref 96–106)
GFR calc Af Amer: 91 mL/min/{1.73_m2} (ref >59)
GFR, EST NON AFRICAN AMERICAN: 78 mL/min/{1.73_m2} (ref >59)
Globulin, Total: 2.8 g/dL (ref 1.5–4.5)
Glucose: 120 mg/dL — ABNORMAL HIGH (ref 65–99)
POTASSIUM: 4.3 mmol/L (ref 3.5–5.2)
Sodium: 141 mmol/L (ref 134–144)
Total Protein: 7.2 g/dL (ref 6.0–8.5)

## 2017-04-05 LAB — HEMOGLOBIN A1C
ESTIMATED AVERAGE GLUCOSE: 137 mg/dL
Hgb A1c MFr Bld: 6.4 % — ABNORMAL HIGH (ref 4.8–5.6)

## 2017-04-15 ENCOUNTER — Encounter: Attending: Family | Primary: Family

## 2017-04-17 ENCOUNTER — Ambulatory Visit: Admit: 2017-04-17 | Discharge: 2017-04-17 | Payer: BLUE CROSS/BLUE SHIELD | Attending: Family | Primary: Family

## 2017-04-17 DIAGNOSIS — Z0000 Encounter for general adult medical examination without abnormal findings: Secondary | ICD-10-CM

## 2017-04-17 LAB — POCT GLYCOSYLATED HEMOGLOBIN (HGB A1C): Hemoglobin A1C: 5.6 %

## 2017-04-17 NOTE — Progress Notes (Signed)
Medicare Annual Wellness Visit  Name: Dustin Zimmerman'UToday's Date: 04/17/2017   MRN: [id number] Sex: Male   Age: 67 y.o. Ethnicity: Non-Hispanic/Non Latino   DOB: 02/25/1951 Race: Dustin SkeansWhite      [name] is here for Medicare AWV ([facility] does not get flu vaccines.)    Screenings for behavioral, psychosocial and functional/safety risks, and cognitive dysfunction are all negative except as indicated below. These results, as well as other patient data from the Health Risk Assessment form, are documented in Flowsheets linked to this Encounter.    Review of Systemsnegative for all systems    Allergies   Allergen Reactions   . Motrin [Ibuprofen] Other (See Comments)     kidney   . Percocet [Oxycodone-Acetaminophen] Hives   . Motrin [Ibuprofen] Nausea And Vomiting     kidney   . Percocet [Oxycodone-Acetaminophen] Rash       Prior to Visit Medications    Medication Sig Taking? Authorizing Provider   levothyroxine (SYNTHROID) 50 MCG tablet TAKE 1 TABLET DAILY Yes Tanna FurryKelly N Smith, APRN - CNP   dorzolamide-timolol (COSOPT) 22.3-6.8 MG/ML ophthalmic solution Place 2 drops into both eyes every 12 hours Yes Alger Memosobert R Daiber, MD   prednisoLONE acetate (PRED FORTE) 1 % ophthalmic suspension  Yes Historical Provider, MD       Past Medical History:   Diagnosis Date   . 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 AK (actinic keratosis) 04/17/2017   . Hypogonadism    . Hypothyroid    . Lumbago    . Malaise and fatigue    . Myalgia    . Polyp of colon 04/17/2017     Past Surgical History:   Procedure Laterality Date   . EYE SURGERY     . FOOT SURGERY      middle toe   . KNEE SURGERY     . SHOULDER SURGERY         Family History   Problem Relation Age of Onset   . Other Father         aspiration pneumonia       CareTeam (Including outside providers/suppliers regularly involved in providing care):   Patient Care Team:  Tanna FurryKelly N Smith, APRN - CNP as PCP - General (Nurse Practitioner)  Alger Memosobert R Daiber, MD    Wt Readings from Last 3  Encounters:   04/17/17 213 lb (96.6 kg)   03/25/17 215 lb (97.5 kg)   02/12/17 211 lb (95.7 kg)     Vitals:    04/17/17 1347   BP: 133/76   Site: Right Upper Arm   Position: Sitting   Cuff Size: Medium Adult   Pulse: 84   Temp: 97.5 F (36.4 C)   TempSrc: Tympanic   SpO2: 98%   Weight: 213 lb (96.6 kg)   Height: 6' 0.01" (1.829 m)     Body mass index is 28.88 kg/m.    General Appearance: alert and oriented to person, place and time, well developed and well- nourished, in no acute distress  Skin: warm and dry, no rash or erythema, mx AK noted on bilateral forearms and lower legs, SK noted on back and left arm  Head: normocephalic and atraumatic  Eyes: pupils equal, round, and reactive to light, extraocular eye movements intact, conjunctivae normal  ENT: tympanic membrane, external ear and ear canal normal bilaterally, nose without deformity, nasal mucosa and turbinates normal without polyps, AC noted to lip, he has mx biopsies on lip  in the past.   Neck: supple and non-tender without mass, no thyromegaly or thyroid nodules, no cervical lymphadenopathy  Pulmonary/Chest: clear to auscultation bilaterally- no wheezes, rales or rhonchi, normal air movement, no respiratory distress  Cardiovascular: normal rate, regular rhythm, normal S1 and S2, no murmurs, rubs, clicks, or gallops, distal pulses intact, no carotid bruits  Abdomen: soft, non-tender, non-distended, normal bowel sounds, no masses or organomegaly  Extremities: no cyanosis, clubbing or edema  Musculoskeletal: normal range of motion, no joint swelling, deformity or tenderness  Neurologic: reflexes normal and symmetric, no cranial nerve deficit, gait, coordination and speech normal    Patient's complete Health Risk Assessment and screening values have been reviewed and are found in Flowsheets. The following problems were reviewed today and where indicated follow up appointments were made and/or referrals ordered.    Positive Risk Factor Screenings with  Interventions:     General Health:  General  In general, how would you say your health is?: Excellent  In the past 7 days, have you experienced any of the following?: (!) New or Increased Pain  Do you get the social and emotional support that you need?: (!) No  Do you have a Living Will?: Yes  General Health Risk Interventions:   Pain issues: patient declines any further evaluation/treatment for this issue   Social isolation: patient declines any further intervention for this issue    Hearing/Vision:  Hearing/Vision  Do you or your family notice any trouble with your hearing?: (!) Yes  Do you have difficulty driving, watching TV, or doing any of your daily activities because of your eyesight?: (!) Yes  Have you had an eye exam within the past year?: Yes  Hearing/Vision Interventions:   Hearing concerns:  patient declines any further evaluation/treatment for hearing issues   Vision concerns:  patient declines any further evaluation/treatment for this issue    Safety:  Safety  Do you have working smoke detectors?: Yes  Have all throw rugs been removed or fastened?: (!) No  Do you have non-slip mats in all bathtubs?: (!) No  Do all of your stairways have a railing or banister?: (!) No  Are your doorways, halls and stairs free of clutter?: (!) No  Do you always fasten your seatbelt when you are in a car?: Yes  Safety Interventions:   Home safety tips provided    Personalized Preventive Plan   Current Health Maintenance Status  Immunization History   Administered Date(s) Administered   . Pneumococcal 13-valent Conjugate (Prevnar13) 12/02/2015   . Tdap (Boostrix, Adacel) 09/29/2013        Health Maintenance   Topic Date Due   . Flu vaccine (1) 11/24/2016   . Pneumococcal low/med risk (2 of 2 - PPSV23) 12/01/2016   . TSH testing  01/04/2017   . Shingles Vaccine (1 of 2 - 2 Dose Series) 04/10/2018 (Originally 12/28/1999)   . Colon cancer screen colonoscopy  07/26/2020   . Lipid screen  01/04/2021   . DTaP/Tdap/Td  vaccine (2 - Td) 09/30/2023   . AAA screen  Completed   . Hepatitis C screen  Addressed     Recommendations for Preventive Services Due: see orders and patient instructions/AVS.  .  Recommended screening schedule for the next 5-10 years is provided to the patient in written form: see Patient Instructions/AVS.     Diagnosis Orders   1. Medicare annual wellness visit, initial     2. Hyperglycemia  Comprehensive Metabolic Panel    POCT glycosylated  hemoglobin (Hb A1C)   3. Hypothyroidism, unspecified type  TSH    T4   4. Actinic cheilitis     5. AK (actinic keratosis)     6. Routine general medical examination at a health care facility  Lipid, Fasting    CBC    Comprehensive Metabolic Panel    PSA Screening    TSH    T4   7. Screening for prostate cancer  PSA Screening   8. Polyp of colon, unspecified part of colon, unspecified type     9. History of pancreatitis     sugar:    Results for orders placed or performed in visit on 04/17/17   POCT glycosylated hemoglobin (Hb A1C)   Result Value Ref Range    Hemoglobin A1C 5.6 %   low sugar diet and regular exercise    Thyroid:  Stable?  Recheck labs  Will call with test results    Skin:  He has seen two dermatologists in the past he declines more tx at this time  The nature of sun-induced photo-aging and skin cancers is discussed.  Sun avoidance, protective clothing, and the use of 30-SPF sunscreens is advised. Observe closely for skin damage/changes, and call if such occurs.    GI issues resolved  Patient advised to follow heart healthy, low fat  diet and 150 minutes of cardiovascular exercise per week   Dentist q43months    Repeat colonoscopy 2021

## 2017-04-17 NOTE — Progress Notes (Signed)
Visit Information    Have you changed or started any medications since your last visit including any over-the-counter medicines, vitamins, or herbal medicines? no   Have you stopped taking any of your medications? Is so, why? -  no  Are you having any side effects from any of your medications? - no    Have you seen any other physician or provider since your last visit?  no   Have you had any other diagnostic tests since your last visit?  no   Have you been seen in the emergency room and/or had an admission in a hospital since we last saw you?  no   Have you had your routine dental cleaning in the past 6 months?  no     Do you have an active MyChart account? If no, what is the barrier?  No: na    Patient Care Team:  Tanna FurryKelly N Smith, APRN - CNP as PCP - General (Nurse Practitioner)  Alger Memosobert R Daiber, MD    Medical History Review  Past Medical, Family, and Social History reviewed and  contribute to the patient presenting condition    Health Maintenance   Topic Date Due   . Shingles Vaccine (1 of 2 - 2 Dose Series) 12/28/1999   . Flu vaccine (1) 11/24/2016   . Pneumococcal low/med risk (2 of 2 - PPSV23) 12/01/2016   . A1C test (Diabetic or Prediabetic)  01/04/2017   . TSH testing  01/04/2017   . Lipid screen  01/04/2021   . DTaP/Tdap/Td vaccine (2 - Td) 09/30/2023   . Colon cancer screen colonoscopy  07/26/2025   . AAA screen  Completed   . Hepatitis C screen  Addressed

## 2017-04-30 LAB — COMPREHENSIVE METABOLIC PANEL
ALT: 35 U/L
AST: 35 U/L
Albumin: 4.5
Alkaline Phosphatase: 55 U/L
BUN: 14 mg/dL
CO2: 30 mmol/L
Calcium: 9.8 mg/dL
Chloride: 103 mmol/L
Creatinine: 0.9
Glucose: 93 mg/dL
Potassium: 4.5 mmol/L
Sodium: 140 mmol/L
Total Bilirubin: 0.7 mg/dL (ref 0.1–1.4)
Total Protein: 7.1

## 2017-04-30 LAB — CBC

## 2017-04-30 LAB — PSA SCREENING: PSA: 1.12 ng/mL

## 2017-04-30 LAB — LIPID, FASTING
Cholesterol, Fasting: 206
HDL: 51 mg/dL (ref 35–70)
LDL Calculated: 138 mg/dL (ref 0–160)
Triglyceride, Fasting: 85

## 2017-04-30 LAB — T4: T4, Total: 0.8

## 2017-04-30 LAB — TSH: TSH: 4.97 u[IU]/mL

## 2017-05-01 ENCOUNTER — Encounter

## 2017-05-02 ENCOUNTER — Encounter

## 2017-05-02 MED ORDER — LEVOTHYROXINE SODIUM 75 MCG PO TABS
75 MCG | ORAL_TABLET | Freq: Every day | ORAL | 1 refills | Status: DC
Start: 2017-05-02 — End: 2017-06-14

## 2017-06-13 LAB — TSH: TSH: 4.31 u[IU]/mL

## 2017-06-13 LAB — T4, FREE: T4 Free: 0.94

## 2017-06-14 ENCOUNTER — Encounter

## 2017-06-14 MED ORDER — LEVOTHYROXINE SODIUM 75 MCG PO TABS
75 MCG | ORAL_TABLET | Freq: Every day | ORAL | 1 refills | Status: DC
Start: 2017-06-14 — End: 2017-06-17

## 2017-06-14 NOTE — Telephone Encounter (Signed)
lov 04/17/17

## 2017-06-17 MED ORDER — LEVOTHYROXINE SODIUM 50 MCG PO TABS
50 MCG | ORAL_TABLET | ORAL | 0 refills | Status: DC
Start: 2017-06-17 — End: 2017-08-22

## 2017-06-17 NOTE — Telephone Encounter (Signed)
Pt advised

## 2017-06-17 NOTE — Telephone Encounter (Signed)
Ok please have him drop back down to 50 mg and I will send refills

## 2017-06-17 NOTE — Telephone Encounter (Signed)
Patient came in and states he has levothyroxine .075mg  and L-thyroxine 50mg , states he is having mood swings, hot flashes and itching from the L- thyroxine. Please advise thank you

## 2017-06-17 NOTE — Telephone Encounter (Signed)
im sorry, he's been taking the 75 mg and hes having the SE from this

## 2017-06-17 NOTE — Telephone Encounter (Signed)
He should have stopped the 50 mg when we made the dose change to 75 mg. I only want him on 75 mg only.

## 2017-06-19 ENCOUNTER — Ambulatory Visit: Admit: 2017-06-19 | Discharge: 2017-06-19 | Payer: BLUE CROSS/BLUE SHIELD | Attending: Family | Primary: Family

## 2017-06-19 DIAGNOSIS — E039 Hypothyroidism, unspecified: Secondary | ICD-10-CM

## 2017-06-19 MED ORDER — FAMOTIDINE 40 MG PO TABS
40 MG | ORAL_TABLET | Freq: Every evening | ORAL | 0 refills | Status: DC
Start: 2017-06-19 — End: 2019-05-04

## 2017-06-19 MED ORDER — LORAZEPAM 0.5 MG PO TABS
0.5 MG | ORAL_TABLET | Freq: Every evening | ORAL | 0 refills | Status: AC | PRN
Start: 2017-06-19 — End: 2017-07-01

## 2017-06-19 NOTE — Progress Notes (Signed)
Visit Information    Have you changed or started any medications since your last visit including any over-the-counter medicines, vitamins, or herbal medicines? no   Have you stopped taking any of your medications? Is so, why? -  no  Are you having any side effects from any of your medications? - no    Have you seen any other physician or provider since your last visit?  no   Have you had any other diagnostic tests since your last visit?  no   Have you been seen in the emergency room and/or had an admission in a hospital since we last saw you?  no   Have you had your routine dental cleaning in the past 6 months?  no     Do you have an active MyChart account? If no, what is the barrier?  No: na    Patient Care Team:  Tanna FurryKelly N Smith, APRN - CNP as PCP - General (Nurse Practitioner)  Alger Memosobert R Daiber, MD    Medical History Review  Past Medical, Family, and Social History reviewed and  contribute to the patient presenting condition    Health Maintenance   Topic Date Due   ??? Flu vaccine (1) 11/24/2016   ??? Pneumococcal 65+ years Vaccine (2 of 2 - PPSV23) 12/01/2016   ??? Shingles Vaccine (1 of 2) 04/10/2018 (Originally 12/28/1999)   ??? TSH testing  06/14/2018   ??? Colon cancer screen colonoscopy  07/26/2020   ??? Lipid screen  04/30/2022   ??? DTaP/Tdap/Td vaccine (2 - Td) 09/30/2023   ??? AAA screen  Completed   ??? Hepatitis C screen  Addressed

## 2017-06-19 NOTE — Progress Notes (Signed)
[facility]  [address]  [place], [place] [id number]  [phone]      Dustin Zimmerman is a 67 y.o. male who presents today for his  medicalconditions/complaints as noted below.  Dustin Zimmerman is c/o of Thyroid Problem (increased dose given side effects;nervous,itch,irritable,decreased but still having symptoms)  .    HPI:    Pt here for evaluation of side effects from dose change with thyroid medication. He was started on 75 mcg d/t low thyroid labs, but has had itching, anxiety, insomnia,  And irritable since changing dose. He called 2 days ago and had dose decreased, but he is still feeling this since he was at higher dose for a few weeks. He has tried benadryl for sleep with no relief. He would like something for this short term until the SE where off.     He also has had some epigastric pain and GERD for past 2 months. He has tried Scientist, research (medical)TUMS with no relief. No sore throat,. Weight loss, nausea/vomtiing or changes in bowels/stool consistency.     He would like referral to [facility] derm for ongoing ecxzema problem.       Past Medical History:   Diagnosis Date   ??? 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 AK (actinic keratosis) 04/17/2017   ??? Hypogonadism    ??? Hypothyroid    ??? Lumbago    ??? Malaise and fatigue    ??? Myalgia    ??? Polyp of colon 04/17/2017      Past Surgical History:   Procedure Laterality Date   ??? EYE SURGERY     ??? FOOT SURGERY      middle toe   ??? KNEE SURGERY     ??? SHOULDER SURGERY       Family History   Problem Relation Age of Onset   ??? No Known Problems Mother    ??? Other Father         aspiration pneumonia     Social History     Tobacco Use   ??? Smoking status: Former Smoker     Packs/day: 1.00     Years: 10.00     Pack years: 10.00     Types: Pipe   ??? Smokeless tobacco: Never Used   Substance Use Topics   ??? Alcohol use: Yes     Alcohol/week: 0.0 oz     Comment: occ      Current Outpatient Medications   Medication Sig Dispense Refill   ??? LORazepam (ATIVAN) 0.5 MG tablet Take 1 tablet by  mouth nightly as needed for Anxiety for up to 12 days. 12 tablet 0   ??? famotidine (PEPCID) 40 MG tablet Take 1 tablet by mouth every evening 30 tablet 0   ??? levothyroxine (SYNTHROID) 50 MCG tablet TAKE 1 TABLET DAILY 90 tablet 0   ??? dorzolamide-timolol (COSOPT) 22.3-6.8 MG/ML ophthalmic solution Place 2 drops into both eyes every 12 hours 1 Bottle 3   ??? prednisoLONE acetate (PRED FORTE) 1 % ophthalmic suspension        No current facility-administered medications for this visit.      Allergies   Allergen Reactions   ??? Motrin [Ibuprofen] Other (See Comments)     kidney   ??? Percocet [Oxycodone-Acetaminophen] Hives   ??? Motrin [Ibuprofen] Nausea And Vomiting     kidney   ??? Percocet [Oxycodone-Acetaminophen] Rash       Health Maintenance   Topic Date Due   ???  Flu vaccine (1) 12/20/2017 (Originally 11/24/2016)   ??? Shingles Vaccine (1 of 2) 04/10/2018 (Originally 12/28/1999)   ??? TSH testing  06/14/2018   ??? Colon cancer screen colonoscopy  07/26/2020   ??? Lipid screen  04/30/2022   ??? DTaP/Tdap/Td vaccine (2 - Td) 09/30/2023   ??? Pneumococcal 65+ years Vaccine  Completed   ??? AAA screen  Completed   ??? Hepatitis C screen  Addressed       Subjective:      Review of Systems   Constitutional: Positive for activity change. Negative for appetite change, chills, diaphoresis, fatigue and fever.   Eyes: Negative for visual disturbance.   Respiratory: Negative for cough, chest tightness and shortness of breath.    Cardiovascular: Negative for chest pain, palpitations and leg swelling.   Gastrointestinal: Positive for abdominal pain (epigastric). Negative for abdominal distention, blood in stool, constipation, diarrhea, nausea and vomiting.   Endocrine: Negative for cold intolerance and heat intolerance.   Genitourinary: Negative for difficulty urinating.   Skin: Negative for rash.        Eczema, itching   Neurological: Negative for dizziness, weakness, light-headedness, numbness and headaches.   Hematological: Negative for adenopathy.    Psychiatric/Behavioral: Positive for decreased concentration and sleep disturbance. Negative for agitation, behavioral problems, confusion, dysphoric mood, self-injury and suicidal ideas. The patient is nervous/anxious.         Irritable         Objective:      Physical Exam   Constitutional: He is oriented to person, place, and time. He appears well-developed and well-nourished. No distress.   HENT:   Head: Normocephalic and atraumatic.   Eyes: Pupils are equal, round, and reactive to light.   Neck: Neck supple. No thyromegaly present.   Cardiovascular: Normal rate, regular rhythm, normal heart sounds and intact distal pulses.   No LE edema   Pulmonary/Chest: Effort normal and breath sounds normal.   Abdominal: Soft. Bowel sounds are normal. He exhibits no distension and no mass. There is tenderness (epigastric). There is no rebound and no guarding. No hernia.   Musculoskeletal: Normal range of motion.   Neurological: He is alert and oriented to person, place, and time.   Skin: Skin is warm and dry. He is not diaphoretic.   Psychiatric: He has a normal mood and affect. His speech is normal and behavior is normal. Judgment and thought content normal. Cognition and memory are normal.   Nursing note and vitals reviewed.      Assessment:       Diagnosis Orders   1. Hypothyroidism, unspecified type  EKG 12 Lead   2. Anxiety  LORazepam (ATIVAN) 0.5 MG tablet    EKG 12 Lead   3. Drug-induced insomnia (HCC)  LORazepam (ATIVAN) 0.5 MG tablet   4. Need for 23-polyvalent pneumococcal polysaccharide vaccine     5. Epigastric pain  EKG 12 Lead   6. Gastroesophageal reflux disease without esophagitis  EKG 12 Lead   7. Chronic eczema  External Referral To Dermatology     Lab Results   Component Value Date    TSH 4.31 06/13/2017     Wt Readings from Last 3 Encounters:   06/19/17 209 lb (94.8 kg)   04/17/17 213 lb (96.6 kg)   03/25/17 215 lb (97.5 kg)     BP Readings from Last 3 Encounters:   06/19/17 120/78   04/17/17 133/76    03/25/17 126/74     Ekg:  NSR, no acute changes  Plan:      Return if symptoms worsen or fail to improve.  Orders Placed This Encounter   Procedures   ??? External Referral To Dermatology     Referral Priority:   Routine     Referral Type:   Eval and Treat     Referral Reason:   Specialty Services Required     Requested Specialty:   Dermatology     Number of Visits Requested:   1   ??? EKG 12 Lead     Order Specific Question:   Reason for Exam?     Answer:   Chest pain     Orders Placed This Encounter   Medications   ??? LORazepam (ATIVAN) 0.5 MG tablet     Sig: Take 1 tablet by mouth nightly as needed for Anxiety for up to 12 days.     Dispense:  12 tablet     Refill:  0   ??? famotidine (PEPCID) 40 MG tablet     Sig: Take 1 tablet by mouth every evening     Dispense:  30 tablet     Refill:  0     Skin:"  New referral given  Please call for appt    Thyroid:  Continue 50 mcg dose  Ativan short term for anxiety and insomnia  All vitals stable, does not appear in thyroid storm or crisis  Call if symptoms do not improve within last 1-2 weeks    Gerd:  Trial pepcid  LF diet, avoid spicy/greasy foods, alcohol, caffeine, and sit upright after meals for 2-3 hours, small portions of food throughout the day, avoid large meals.   Call INB or worsening    Patient given educational materials - see patient instructions.  Discussed use,benefit, and side effects of prescribed medications.  All patient questions answered.Pt voiced understanding. Reviewed health maintenance.  Instructed to continue currentmedications, diet and exercise.    Electronically signed by Ardeth Perfect Smith,CNP on 06/19/2017 at 10:49 AM

## 2017-06-24 DEATH — deceased

## 2017-07-04 ENCOUNTER — Ambulatory Visit: Payer: Medicare Other | Admitting: Family Medicine

## 2017-07-23 NOTE — Telephone Encounter (Signed)
Patient called needs a lab slip to have his Thyroid checked . Please call when ready to pick up. Thank You

## 2017-07-23 NOTE — Telephone Encounter (Signed)
Labs were checked 06/21/17 and were normal range, why does he need new labs?

## 2017-07-31 NOTE — Telephone Encounter (Signed)
LM for pt to call back

## 2017-08-01 ENCOUNTER — Encounter

## 2017-08-01 NOTE — Telephone Encounter (Signed)
Pt came in and he would like the labs rechecked because he was switched back down the the back in march from the . He would to make sure that he is still in normal range with the lower dose. FYI Dr Ruthine Dose ordered

## 2017-08-02 NOTE — Telephone Encounter (Signed)
Pt coming on Monday to p/u order

## 2017-08-08 ENCOUNTER — Telehealth

## 2017-08-08 NOTE — Telephone Encounter (Signed)
Referral placed, please fax

## 2017-08-08 NOTE — Telephone Encounter (Signed)
Faxed

## 2017-08-08 NOTE — Telephone Encounter (Signed)
Pt called states Dermatology referral was not sent to [facility]. Pt states referral needs to be sent to:  Herbert Spires MD  [facility]  Dermatology at [facility] Floor 1, Reception [address]   [address]   [place], [place] [address]   Appointments: [phone]   Fax: [phone]     Patient also states they are booking out to September. Patient asks; "is there anything Ok Anis, (CNP), can do to help me get scheduled in sooner"?    Please contact patient

## 2017-08-08 NOTE — Telephone Encounter (Signed)
I will place referral for urgent, but that is all I can do and advise him to be put on a cancellation list if available

## 2017-08-12 ENCOUNTER — Encounter: Payer: Self-pay | Admitting: Family Medicine

## 2017-08-12 ENCOUNTER — Ambulatory Visit: Payer: Medicare Other | Admitting: Family Medicine

## 2017-08-12 VITALS — BP 132/70 | HR 71 | Temp 98.2°F | Ht 72.0 in | Wt 325.8 lb

## 2017-08-12 DIAGNOSIS — W57XXXA Bitten or stung by nonvenomous insect and other nonvenomous arthropods, initial encounter: Secondary | ICD-10-CM | POA: Diagnosis not present

## 2017-08-12 DIAGNOSIS — Z125 Encounter for screening for malignant neoplasm of prostate: Secondary | ICD-10-CM

## 2017-08-12 DIAGNOSIS — I350 Nonrheumatic aortic (valve) stenosis: Secondary | ICD-10-CM | POA: Diagnosis not present

## 2017-08-12 DIAGNOSIS — Z0000 Encounter for general adult medical examination without abnormal findings: Secondary | ICD-10-CM | POA: Diagnosis not present

## 2017-08-12 DIAGNOSIS — Z23 Encounter for immunization: Secondary | ICD-10-CM

## 2017-08-12 DIAGNOSIS — R7303 Prediabetes: Secondary | ICD-10-CM | POA: Diagnosis not present

## 2017-08-12 DIAGNOSIS — E785 Hyperlipidemia, unspecified: Secondary | ICD-10-CM

## 2017-08-12 DIAGNOSIS — I10 Essential (primary) hypertension: Secondary | ICD-10-CM

## 2017-08-12 MED ORDER — ZOSTER VAC RECOMB ADJUVANTED 50 MCG/0.5ML IM SUSR: 0.5000 mL | Freq: Once | INTRAMUSCULAR | 1 refills | Status: AC

## 2017-08-12 NOTE — Progress Notes (Signed)
Subjective:  By signing my name below, I, Malik [name], attest that this documentation has been prepared under the direction and in the presence of Malik Ray, MD. Electronically Signed: Moises [name], [place]. 08/12/2017 , 11:08 AM .  Patient was seen in Room 12 .   Patient ID: Malik [name], male    DOB: [birth year], 67 y.o.   MRN: [id number] Chief Complaint  Patient presents with  . Annual Exam    CPE   HPI  [name] is a 67 y.o. male Here for annual physical. He has a history of HTN, hyperlipidemia, prediabetes, and aortic stenosis. He is fasting today.   Tick bite He was working outside with his son past week. When he went inside for a shower, he noticed a tick over his right upper thigh, and on his ankle. He rubbed some Vick's around the tick, and was able to remove the tick afterwards. He states the ticks were on him no more than 3 hours. He had a red area around the bites, but the redness has improved since.   HTN He takes lisinopril-HCTZ 20-'25mg'$  QD, and Toprol '100mg'$  QD. He denies chest pain, chest tightness, cough, or shortness of breath.   Lab Results  Component Value Date   CREATININE 0.99 04/04/2017   Hyperlipidemia Lab Results  Component Value Date   CHOL 139 04/04/2017   HDL 31 (L) 04/04/2017   LDLCALC 88 04/04/2017   TRIG 98 04/04/2017   CHOLHDL 4.5 04/04/2017   Lab Results  Component Value Date   ALT 40 04/04/2017   AST 27 04/04/2017   ALKPHOS 57 04/04/2017   BILITOT 0.5 04/04/2017   He takes Zocor '80mg'$  QD.   Pre-diabetes Lab Results  Component Value Date   HGBA1C 6.4 (H) 04/04/2017   Wt Readings from Last 3 Encounters:  08/12/17 (!) 325 lb 12.8 oz (147.8 kg)  04/04/17 (!) 325 lb (147.4 kg)  10/02/16 (!) 321 lb 3.2 oz (145.7 kg)   Diet and exercise discussed at last visit. Plans to have consultation with [facility] weight loss program.   Cancer Screening Colonoscopy: March 2009 with Dr. Fuller Plan; had some polyps with repeat testing  in 10 years. Polyp was tubular adenoma, and benign mucosa.   Prostate cancer screening:  Lab Results  Component Value Date   PSA 0.61 07/28/2015   PSA 0.60 05/13/2014   PSA 0.62 09/16/2012    Immunizations Immunization History  Administered Date(s) Administered  . Influenza Split 12/18/2011  . Influenza,inj,Quad PF,6+ Mos 01/12/2014, 12/21/2014, 12/15/2015, 04/04/2017  . Pneumococcal Conjugate-13 05/13/2014  . Pneumococcal Polysaccharide-23 12/14/2008, 07/28/2015  . Tdap 03/27/2007   Shingles vaccine: he had Zostavax 7 years ago; Shingrix prescription sent in to pharmacy Tdap: will defer today.   Depression Depression screen [facility] 2/9 08/12/2017 04/04/2017 10/02/2016 04/19/2016 12/15/2015  Decreased Interest 0 0 0 0 0  Down, Depressed, Hopeless 0 0 0 0 0  PHQ - 2 Score 0 0 0 0 0   Fall screening: No falls in past year  Vision  Visual Acuity Screening   Right eye Left eye Both eyes  Without correction: 20/40 20/30-1 20/25-3  With correction:      He has an eye doctor, but hasn't see in a while.   Dentist He is followed by dentist, seen every 6 years.   Exercise He goes to the gym about 3 days a week, mostly using the elliptical. He denies chest pain or shortness of breath with exercise.   Functional  Status Survey Is the patient deaf or have difficulty hearing?: No Does the patient have difficulty seeing, even when wearing glasses/contacts?: No Does the patient have difficulty concentrating, remembering, or making decisions?: No Does the patient have difficulty walking or climbing stairs?: Yes Does the patient have difficulty dressing or bathing?: No Does the patient have difficulty doing errands alone such as visiting a doctor's office or shopping?: No  Mental Status Survey 6CIT Screen 08/12/2017  What Year? 0 points  What month? 0 points  What time? 0 points  Count back from 20 0 points  Months in reverse 0 points  Repeat phrase 0 points  Total Score 0     Advanced Directives He has a healthcare power of attorney, copy requested.   Patient Active Problem List   Diagnosis Date Noted  . Solitary pulmonary nodule 05/13/2014  . Aortic stenosis, moderate 01/06/2013  . Murmur 11/17/2012  . Dyspnea 11/17/2012  . Hypertension 12/18/2011  . Hyperglycemia 12/18/2011  . Hyperlipidemia 12/18/2011  . Arthritis 12/18/2011  . Obesity 12/18/2011   Past Medical History:  Diagnosis Date  . Aortic stenosis   . Arthritis   . Hyperglycemia   . Hyperlipidemia   . Hypertension   . Obesity    Past Surgical History:  Procedure Laterality Date  . APPENDECTOMY     No Known Allergies Prior to Admission medications   Medication Sig Start Date End Date Taking? Authorizing Provider  aspirin 325 MG EC tablet Take 325 mg by mouth daily.   Yes [provider]  lisinopril-hydrochlorothiazide (PRINZIDE,ZESTORETIC) 20-25 MG tablet Take 1 tablet by mouth daily. 04/04/17  Yes Wendie Agreste, MD  metoprolol succinate (TOPROL-XL) 100 MG 24 hr tablet Take 1 tablet (100 mg total) by mouth daily. Take with or immediately following a meal. 04/04/17  Yes Wendie Agreste, MD  simvastatin (ZOCOR) 80 MG tablet TAKE 1 TABLET(80 MG) BY MOUTH AT BEDTIME 04/04/17  Yes Wendie Agreste, MD   Social History   Socioeconomic History  . Marital status: Divorced    Spouse name: Not on file  . Number of children: Not on file  . Years of education: Not on file  . Highest education level: Not on file  Occupational History  . Not on file  Social Needs  . Financial resource strain: Not on file  . Food insecurity:    Worry: Not on file    Inability: Not on file  . Transportation needs:    Medical: Not on file    Non-medical: Not on file  Tobacco Use  . Smoking status: Former Smoker    Years: 20.00    Types: Cigarettes, Cigars    Last attempt to quit: 08/19/1978    Years since quitting: 39.0  . Smokeless tobacco: Never Used  Substance and Sexual Activity   . Alcohol use: No  . Drug use: No  . Sexual activity: Not on file  Lifestyle  . Physical activity:    Days per week: Not on file    Minutes per session: Not on file  . Stress: Not on file  Relationships  . Social connections:    Talks on phone: Not on file    Gets together: Not on file    Attends religious service: Not on file    Active member of club or organization: Not on file    Attends meetings of clubs or organizations: Not on file    Relationship status: Not on file  . Intimate partner violence:  Fear of current or ex partner: Not on file    Emotionally abused: Not on file    Physically abused: Not on file    Forced sexual activity: Not on file  Other Topics Concern  . Not on file  Social History Narrative  . Not on file    Review of Systems 13 point ROS - negative     Objective:   Physical Exam  Constitutional: He is oriented to person, place, and time. He appears well-developed and well-nourished.  HENT:  Head: Normocephalic and atraumatic.  Right Ear: External ear normal.  Left Ear: External ear normal.  Mouth/Throat: Oropharynx is clear and moist.  Eyes: Pupils are equal, round, and reactive to light. Conjunctivae and EOM are normal.  Neck: Normal range of motion. Neck supple. No thyromegaly present.  Cardiovascular: Normal rate, regular rhythm, normal heart sounds and intact distal pulses.  Pulmonary/Chest: Effort normal and breath sounds normal. No respiratory distress. He has no wheezes.  Abdominal: Soft. He exhibits no distension. There is no tenderness. Hernia confirmed negative in the right inguinal area and confirmed negative in the left inguinal area.  Genitourinary: Prostate normal.  Musculoskeletal: Normal range of motion. He exhibits no edema or tenderness.  Lymphadenopathy:    He has no cervical adenopathy.  Neurological: He is alert and oriented to person, place, and time. He has normal reflexes.  Skin: Skin is warm and dry.  Right leg: 1  cm erythematous patch on the top of the right leg, no retained tick parts Ankle: no rash seen  Psychiatric: He has a normal mood and affect. His behavior is normal.  Vitals reviewed.   Vitals:   08/12/17 1016  BP: 132/70  Pulse: 71  Temp: 98.2 F (36.8 C)  TempSrc: Oral  SpO2: 96%  Weight: (!) 325 lb 12.8 oz (147.8 kg)  Height: 6' (1.829 m)       Assessment & Plan:    CARMEN VALLECILLO is a 66 y.o. male Medicare annual wellness visit, subsequent  - anticipatory guidance as below in AVS, screening labs if needed. Health maintenance items as above in HPI discussed/recommended as applicable.   - no concerning responses on depression, fall, or functional status screening. Any positive responses noted as above. Advanced directives discussed as in CHL.   Tick bite, initial encounter  - short term attachment and no sign of infection or systemic symptoms. Continue localized wound care, watch for any increased redness or rash, RTc precautions. Handout given on tick bites as well as recommended removal technique.   Essential hypertension - Plan: Comprehensive metabolic panel  - stable, no med changes, labs above.   Hyperlipidemia, unspecified hyperlipidemia type - Plan: Lipid panel  - tolerating zocor, continue same dose, labs pending.   Severe obesity (BMI >= 40) (HCC)  - commended on exercise, work on increasing to most days per week. Plans on obtaining more info from area weight loss program but also provided other contact info for medical bariatric specialist.   Prediabetes  - continue to work on diet, exercise. Repeat A1c at next visit.   Aortic valve stenosis, etiology of cardiac valve disease unspecified  - follow up with cardiology as planned.   Need for shingles vaccine - Plan: Zoster Vaccine Adjuvanted [facility]) injection sent to pharmacy  Screening for prostate cancer - Plan: PSA  - We discussed pros and cons of prostate cancer screening, and after this discussion, he  chose to have screening done. PSA obtained, and no concerning findings  on DRE.    Meds ordered this encounter  Medications  . Zoster Vaccine Adjuvanted [facility]) injection    Sig: Inject 0.5 mLs into the muscle once for 1 dose. Repeat in 2-6 months.    Dispense:  0.5 mL    Refill:  1   Patient Instructions    For weight loss - can meet with [facility] to see what they offer, but other resource is medical bariatric specialist: Dennard Nip, MD, Medical Weight Loss Management  [phone]  Tick bite should continue to improve. See info below, but if any increasing redness or new rash - be seen right away.   Increase exercise to most days per week if possible. Low intensity, 150 minutes per week minimum.   Follow up with cardiologist as planned for follow up of aortic stenosis.   Call Dr. Fuller Plan for scheduling colonoscopy.   Shingrix vaccine sent to your pharmacy if that is something you want.   Return to the clinic or go to the nearest emergency room if any of your symptoms worsen or new symptoms occur.   Preventive Care 19 Years and Older, Male Preventive care refers to lifestyle choices and visits with your health care provider that can promote health and wellness. What does preventive care include?  A yearly physical exam. This is also called an annual well check.  Dental exams once or twice a year.  Routine eye exams. Ask your health care provider how often you should have your eyes checked.  Personal lifestyle choices, including: ? Daily care of your teeth and gums. ? Regular physical activity. ? Eating a healthy diet. ? Avoiding tobacco and drug use. ? Limiting alcohol use. ? Practicing safe sex. ? Taking low doses of aspirin every day. ? Taking vitamin and mineral supplements as recommended by your health care provider. What happens during an annual well check? The services and screenings done by your health care provider during your annual well check will  depend on your age, overall health, lifestyle risk factors, and family history of disease. Counseling Your health care provider may ask you questions about your:  Alcohol use.  Tobacco use.  Drug use.  Emotional well-being.  Home and relationship well-being.  Sexual activity.  Eating habits.  History of falls.  Memory and ability to understand (cognition).  Work and work Statistician.  Screening You may have the following tests or measurements:  Height, weight, and BMI.  [name] pressure.  Lipid and cholesterol levels. These may be checked every 5 years, or more frequently if you are over 31 years old.  Skin check.  Lung cancer screening. You may have this screening every year starting at age 54 if you have a 30-pack-year history of smoking and currently smoke or have quit within the past 15 years.  Fecal occult [name] test (FOBT) of the stool. You may have this test every year starting at age 27.  Flexible sigmoidoscopy or colonoscopy. You may have a sigmoidoscopy every 5 years or a colonoscopy every 10 years starting at age 69.  Prostate cancer screening. Recommendations will vary depending on your family history and other risks.  Hepatitis C [name] test.  Hepatitis B [name] test.  Sexually transmitted disease (STD) testing.  Diabetes screening. This is done by checking your [name] sugar (glucose) after you have not eaten for a while (fasting). You may have this done every 1-3 years.  Abdominal aortic aneurysm (AAA) screening. You may need this if you are a current or former smoker.  Osteoporosis. You may be screened starting at age 3 if you are at high risk.  Talk with your health care provider about your test results, treatment options, and if necessary, the need for more tests. Vaccines Your health care provider may recommend certain vaccines, such as:  Influenza vaccine. This is recommended every year.  Tetanus, diphtheria, and acellular pertussis (Tdap,  Td) vaccine. You may need a Td booster every 10 years.  Varicella vaccine. You may need this if you have not been vaccinated.  Zoster vaccine. You may need this after age 68.  Measles, mumps, and rubella (MMR) vaccine. You may need at least one dose of MMR if you were born in 1957 or later. You may also need a second dose.  Pneumococcal 13-valent conjugate (PCV13) vaccine. One dose is recommended after age 28.  Pneumococcal polysaccharide (PPSV23) vaccine. One dose is recommended after age 26.  Meningococcal vaccine. You may need this if you have certain conditions.  Hepatitis A vaccine. You may need this if you have certain conditions or if you travel or work in places where you may be exposed to hepatitis A.  Hepatitis B vaccine. You may need this if you have certain conditions or if you travel or work in places where you may be exposed to hepatitis B.  Haemophilus influenzae type b (Hib) vaccine. You may need this if you have certain risk factors.  Talk to your health care provider about which screenings and vaccines you need and how often you need them. This information is not intended to replace advice given to you by your health care provider. Make sure you discuss any questions you have with your health care provider. Document Released: 04/08/2015 Document Revised: 11/30/2015 Document Reviewed: 01/11/2015 Elsevier Interactive Patient Education  2018 [place], Adult Ticks are insects that can bite. Most ticks live in shrubs and grassy areas. They climb onto people and animals that go by. Then they bite. Some ticks carry germs that can make you sick. How can I prevent tick bites?  Use an insect repellent that has 20% or higher of the ingredients DEET, picaridin, or IR3535. Put this insect repellent on: ? Bare skin. ? The tops of your boots. ? Your pant legs. ? The ends of your sleeves.  If you use an insect repellent that has the ingredient  permethrin, make sure to follow the instructions on the bottle. Treat the following: ? Clothing. ? Supplies. ? Boots. ? Tents.  Wear long sleeves, long pants, and light colors.  Tuck your pant legs into your socks.  Stay in the middle of the trail.  Try not to walk through long grass.  Before going inside your house, check your clothes, hair, and skin for ticks. Make sure to check your head, neck, armpits, waist, groin, and joint areas.  Check for ticks every day.  When you come indoors: ? Wash your clothes right away. ? Shower right away. ? Dry your clothes in a dryer on high heat for 60 minutes or more. What is the right way to remove a tick? Remove a tick from your skin as soon as possible.  To remove a tick that is crawling on your skin: ? Go outdoors and brush the tick off. ? Use tape or a lint roller.  To remove a tick that is biting: ? Wash your hands. ? If you have latex gloves, put them on. ? Use tweezers, curved forceps, or a tick-removal tool to grasp the  tick. Grasp the tick as close to your skin and as close to the tick's head as possible. ? Gently pull up until the tick lets go.  Try to keep the tick's head attached to its body.  Do not twist or jerk the tick.  Do not squeeze or crush the tick.  Do not try to remove a tick with heat, alcohol, petroleum jelly, or fingernail polish. How should I get rid of a tick? Here are some ways to get rid of a tick that is alive:  Place the tick in rubbing alcohol.  Place the tick in a bag or container you can close tightly.  Wrap the tick tightly in tape.  Flush the tick down the toilet.  Contact a doctor if:  You have symptoms of a disease, such as: ? Pain in a muscle, joint, or bone. ? Trouble walking or moving your legs. ? Numbness in your legs. ? Inability to move (paralysis). ? A red rash that makes a circle (bull's-eye rash). ? Redness and swelling where the tick bit you. ? A fever. ? Throwing up  (vomiting) over and over. ? Diarrhea. ? Weight loss. ? Tender and swollen lymph glands. ? Shortness of breath. ? Cough. ? Belly pain (abdominal pain). ? Headache. ? Being more tired than normal. ? A change in how alert (conscious) you are. ? Confusion. Get help right away if:  You cannot remove a tick.  A part of a tick breaks off and gets stuck in your skin.  You are feeling worse. Summary  Ticks may carry germs that can make you sick.  To prevent tick bites, wear long sleeves, long pants, and light colors. Use insect repellent. Follow the instructions on the bottle.  If the tick is biting, do not try to remove it with heat, alcohol, petroleum jelly, or fingernail polish.  Use tweezers, curved forceps, or a tick-removal tool to grasp the tick. Gently pull up until the tick lets go. Do not twist or jerk the tick. Do not squeeze or crush the tick.  If you have symptoms, contact a doctor. This information is not intended to replace advice given to you by your health care provider. Make sure you discuss any questions you have with your health care provider. Document Released: 06/06/2009 Document Revised: 06/22/2016 Document Reviewed: 06/22/2016 Elsevier Interactive Patient Education  2018 [employer].    IF you received an x-[name] today, you will receive an invoice from [facility]. Please contact [facility] at [phone] with questions or concerns regarding your invoice.   IF you received labwork today, you will receive an invoice from [place]. Please contact [facility] at [phone] with questions or concerns regarding your invoice.   Our billing staff will not be able to assist you with questions regarding bills from these companies.  You will be contacted with the lab results as soon as they are available. The fastest way to get your results is to activate your My Chart account. Instructions are located on the last page of this paperwork. If you have  not heard from [place] regarding the results in 2 weeks, please contact this office.       I personally performed the services described in this documentation, which was scribed in my presence. The recorded information has been reviewed and considered for accuracy and completeness, addended by me as needed, and agree with information above.  Signed,   Malik Ray, MD [facility] [place].  08/12/17 10:50  PM

## 2017-08-12 NOTE — Patient Instructions (Addendum)
For weight loss - can meet with [facility] to see what they offer, but other resource is medical bariatric specialist: Dennard Nip, MD, Medical Weight Loss Management  [phone]  Tick bite should continue to improve. See info below, but if any increasing redness or new rash - be seen right away.   Increase exercise to most days per week if possible. Low intensity, 150 minutes per week minimum.   Follow up with cardiologist as planned for follow up of aortic stenosis.   Call Dr. Fuller Plan for scheduling colonoscopy.   Shingrix vaccine sent to your pharmacy if that is something you want.   Return to the clinic or go to the nearest emergency room if any of your symptoms worsen or new symptoms occur.   Preventive Care 56 Years and Older, Male Preventive care refers to lifestyle choices and visits with your health care provider that can promote health and wellness. What does preventive care include?  A yearly physical exam. This is also called an annual well check.  Dental exams once or twice a year.  Routine eye exams. Ask your health care provider how often you should have your eyes checked.  Personal lifestyle choices, including: ? Daily care of your teeth and gums. ? Regular physical activity. ? Eating a healthy diet. ? Avoiding tobacco and drug use. ? Limiting alcohol use. ? Practicing safe sex. ? Taking low doses of aspirin every day. ? Taking vitamin and mineral supplements as recommended by your health care provider. What happens during an annual well check? The services and screenings done by your health care provider during your annual well check will depend on your age, overall health, lifestyle risk factors, and family history of disease. Counseling Your health care provider may ask you questions about your:  Alcohol use.  Tobacco use.  Drug use.  Emotional well-being.  Home and relationship well-being.  Sexual activity.  Eating habits.  History of  falls.  Memory and ability to understand (cognition).  Work and work Statistician.  Screening You may have the following tests or measurements:  Height, weight, and BMI.  Blood pressure.  Lipid and cholesterol levels. These may be checked every 5 years, or more frequently if you are over 33 years old.  Skin check.  Lung cancer screening. You may have this screening every year starting at age 46 if you have a 30-pack-year history of smoking and currently smoke or have quit within the past 15 years.  Fecal occult blood test (FOBT) of the stool. You may have this test every year starting at age 36.  Flexible sigmoidoscopy or colonoscopy. You may have a sigmoidoscopy every 5 years or a colonoscopy every 10 years starting at age 99.  Prostate cancer screening. Recommendations will vary depending on your family history and other risks.  Hepatitis C blood test.  Hepatitis B blood test.  Sexually transmitted disease (STD) testing.  Diabetes screening. This is done by checking your blood sugar (glucose) after you have not eaten for a while (fasting). You may have this done every 1-3 years.  Abdominal aortic aneurysm (AAA) screening. You may need this if you are a current or former smoker.  Osteoporosis. You may be screened starting at age 73 if you are at high risk.  Talk with your health care provider about your test results, treatment options, and if necessary, the need for more tests. Vaccines Your health care provider may recommend certain vaccines, such as:  Influenza vaccine. This is recommended every year.  Tetanus, diphtheria, and acellular pertussis (Tdap, Td) vaccine. You may need a Td booster every 10 years.  Varicella vaccine. You may need this if you have not been vaccinated.  Zoster vaccine. You may need this after age 7.  Measles, mumps, and rubella (MMR) vaccine. You may need at least one dose of MMR if you were born in 1957 or later. You may also need a second  dose.  Pneumococcal 13-valent conjugate (PCV13) vaccine. One dose is recommended after age 46.  Pneumococcal polysaccharide (PPSV23) vaccine. One dose is recommended after age 72.  Meningococcal vaccine. You may need this if you have certain conditions.  Hepatitis A vaccine. You may need this if you have certain conditions or if you travel or work in places where you may be exposed to hepatitis A.  Hepatitis B vaccine. You may need this if you have certain conditions or if you travel or work in places where you may be exposed to hepatitis B.  Haemophilus influenzae type b (Hib) vaccine. You may need this if you have certain risk factors.  Talk to your health care provider about which screenings and vaccines you need and how often you need them. This information is not intended to replace advice given to you by your health care provider. Make sure you discuss any questions you have with your health care provider. Document Released: 04/08/2015 Document Revised: 11/30/2015 Document Reviewed: 01/11/2015 Elsevier Interactive Patient Education  2018 [place], Adult Ticks are insects that can bite. Most ticks live in shrubs and grassy areas. They climb onto people and animals that go by. Then they bite. Some ticks carry germs that can make you sick. How can I prevent tick bites?  Use an insect repellent that has 20% or higher of the ingredients DEET, picaridin, or IR3535. Put this insect repellent on: ? Bare skin. ? The tops of your boots. ? Your pant legs. ? The ends of your sleeves.  If you use an insect repellent that has the ingredient permethrin, make sure to follow the instructions on the bottle. Treat the following: ? Clothing. ? Supplies. ? Boots. ? Tents.  Wear long sleeves, long pants, and light colors.  Tuck your pant legs into your socks.  Stay in the middle of the trail.  Try not to walk through long grass.  Before going inside your house,  check your clothes, hair, and skin for ticks. Make sure to check your head, neck, armpits, waist, groin, and joint areas.  Check for ticks every day.  When you come indoors: ? Wash your clothes right away. ? Shower right away. ? Dry your clothes in a dryer on high heat for 60 minutes or more. What is the right way to remove a tick? Remove a tick from your skin as soon as possible.  To remove a tick that is crawling on your skin: ? Go outdoors and brush the tick off. ? Use tape or a lint roller.  To remove a tick that is biting: ? Wash your hands. ? If you have latex gloves, put them on. ? Use tweezers, curved forceps, or a tick-removal tool to grasp the tick. Grasp the tick as close to your skin and as close to the tick's head as possible. ? Gently pull up until the tick lets go.  Try to keep the tick's head attached to its body.  Do not twist or jerk the tick.  Do not squeeze or crush the tick.  Do  not try to remove a tick with heat, alcohol, petroleum jelly, or fingernail polish. How should I get rid of a tick? Here are some ways to get rid of a tick that is alive:  Place the tick in rubbing alcohol.  Place the tick in a bag or container you can close tightly.  Wrap the tick tightly in tape.  Flush the tick down the toilet.  Contact a doctor if:  You have symptoms of a disease, such as: ? Pain in a muscle, joint, or bone. ? Trouble walking or moving your legs. ? Numbness in your legs. ? Inability to move (paralysis). ? A red rash that makes a circle (bull's-eye rash). ? Redness and swelling where the tick bit you. ? A fever. ? Throwing up (vomiting) over and over. ? Diarrhea. ? Weight loss. ? Tender and swollen lymph glands. ? Shortness of breath. ? Cough. ? Belly pain (abdominal pain). ? Headache. ? Being more tired than normal. ? A change in how alert (conscious) you are. ? Confusion. Get help right away if:  You cannot remove a tick.  A part of a  tick breaks off and gets stuck in your skin.  You are feeling worse. Summary  Ticks may carry germs that can make you sick.  To prevent tick bites, wear long sleeves, long pants, and light colors. Use insect repellent. Follow the instructions on the bottle.  If the tick is biting, do not try to remove it with heat, alcohol, petroleum jelly, or fingernail polish.  Use tweezers, curved forceps, or a tick-removal tool to grasp the tick. Gently pull up until the tick lets go. Do not twist or jerk the tick. Do not squeeze or crush the tick.  If you have symptoms, contact a doctor. This information is not intended to replace advice given to you by your health care provider. Make sure you discuss any questions you have with your health care provider. Document Released: 06/06/2009 Document Revised: 06/22/2016 Document Reviewed: 06/22/2016 Elsevier Interactive Patient Education  2018 [employer].    IF you received an x-ray today, you will receive an invoice from [facility]. Please contact [facility] at [phone] with questions or concerns regarding your invoice.   IF you received labwork today, you will receive an invoice from [facility]. Please contact [facility] at [phone] with questions or concerns regarding your invoice.   Our billing staff will not be able to assist you with questions regarding bills from these companies.  You will be contacted with the lab results as soon as they are available. The fastest way to get your results is to activate your My Chart account. Instructions are located on the last page of this paperwork. If you have not heard from [place] regarding the results in 2 weeks, please contact this office.

## 2017-08-13 LAB — COMPREHENSIVE METABOLIC PANEL
A/G RATIO: 1.7 (ref 1.2–2.2)
ALK PHOS: 61 IU/L (ref 39–117)
ALT: 30 IU/L (ref 0–44)
AST: 22 IU/L (ref 0–40)
Albumin: 4.3 g/dL (ref 3.6–4.8)
BUN/Creatinine Ratio: 18 (ref 10–24)
BUN: 15 mg/dL (ref 8–27)
Bilirubin Total: 0.6 mg/dL (ref 0.0–1.2)
CO2: 22 mmol/L (ref 20–29)
CREATININE: 0.82 mg/dL (ref 0.76–1.27)
Calcium: 9.4 mg/dL (ref 8.6–10.2)
Chloride: 104 mmol/L (ref 96–106)
GFR calc Af Amer: 106 mL/min/{1.73_m2} (ref >59)
GFR calc non Af Amer: 92 mL/min/{1.73_m2} (ref >59)
GLOBULIN, TOTAL: 2.6 g/dL (ref 1.5–4.5)
Glucose: 112 mg/dL — ABNORMAL HIGH (ref 65–99)
POTASSIUM: 3.9 mmol/L (ref 3.5–5.2)
SODIUM: 142 mmol/L (ref 134–144)
Total Protein: 6.9 g/dL (ref 6.0–8.5)

## 2017-08-13 LAB — PSA: Prostate Specific Ag, Serum: 0.7 ng/mL (ref 0.0–4.0)

## 2017-08-13 LAB — LIPID PANEL
Chol/HDL Ratio: 4.8 ratio (ref 0.0–5.0)
Cholesterol, Total: 144 mg/dL (ref 100–199)
HDL: 30 mg/dL — ABNORMAL LOW (ref >39)
LDL Calculated: 91 mg/dL (ref 0–99)
TRIGLYCERIDES: 117 mg/dL (ref 0–149)
VLDL Cholesterol Cal: 23 mg/dL (ref 5–40)

## 2017-08-21 NOTE — Telephone Encounter (Signed)
Would like a call back regarding thyroid levels/numbers. Would like to know if he needs to go back on meds. Please call pt as soon as you can.     Last visit 06/19/17

## 2017-08-22 MED ORDER — LEVOTHYROXINE SODIUM 50 MCG PO TABS
50 MCG | ORAL_TABLET | ORAL | 3 refills | Status: DC
Start: 2017-08-22 — End: 2017-09-25

## 2017-08-22 NOTE — Telephone Encounter (Signed)
Advised 

## 2017-08-22 NOTE — Telephone Encounter (Signed)
There was result note in chart about thyroid labs, they were normal and ok to continue 50 mcg dose daily

## 2017-08-23 LAB — TSH WITH REFLEX: TSH: 5.58 u[IU]/mL

## 2017-08-28 ENCOUNTER — Encounter: Payer: Self-pay | Admitting: Radiology

## 2017-09-04 ENCOUNTER — Encounter

## 2017-09-24 NOTE — Telephone Encounter (Signed)
Patient request medication refill. Patient has about 4-5 days remaining. Patient would like it to go to [facility].     Last OV: 06/19/17

## 2017-09-25 MED ORDER — LEVOTHYROXINE SODIUM 50 MCG PO TABS
50 MCG | ORAL_TABLET | ORAL | 2 refills | Status: DC
Start: 2017-09-25 — End: 2018-06-22

## 2017-09-25 NOTE — Telephone Encounter (Signed)
LV 06/19/17

## 2017-09-29 IMAGING — CT CT CHEST W/O CM
3 of 4 series · 17 of 30 positions shown, 19 images · non-contrast
Comparison: 01/28/2014.  12/02/2012.

CLINICAL DATA: Followup pulmonary nodule

EXAM:
CT CHEST WITHOUT CONTRAST
TECHNIQUE: Multidetector CT imaging of the chest was performed following the
standard protocol without IV contrast.

[Series 3: chest w/o · axial · non-contrast · 0.84mm/px · z∈[-264,-34]mm · 5 of 70 slices shown, 7 images]
[im 12/70  mediastinal]
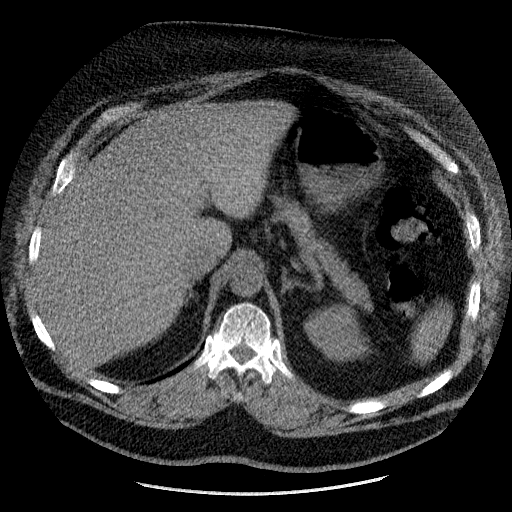
[im 12/70  lung]
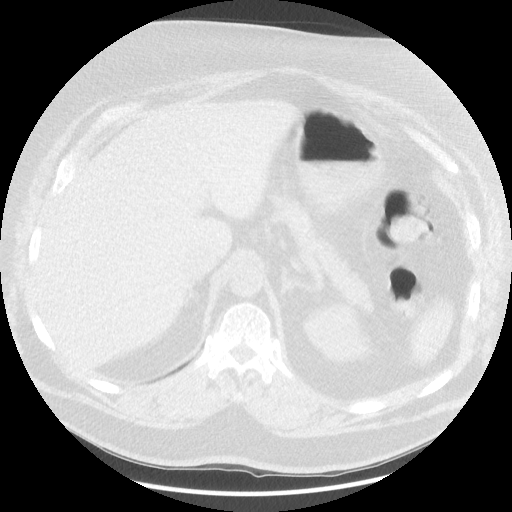
[im 24/70  lung]
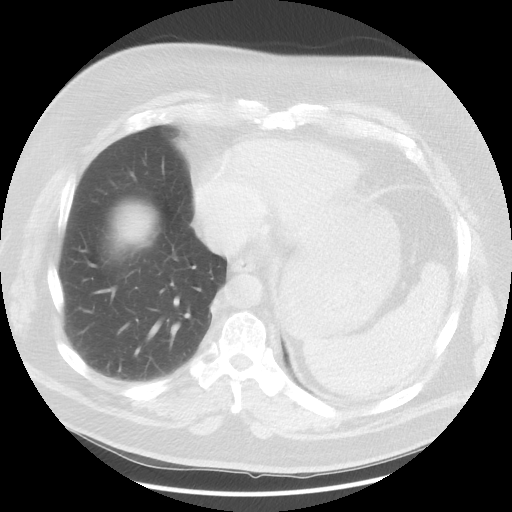
[im 35/70  lung]
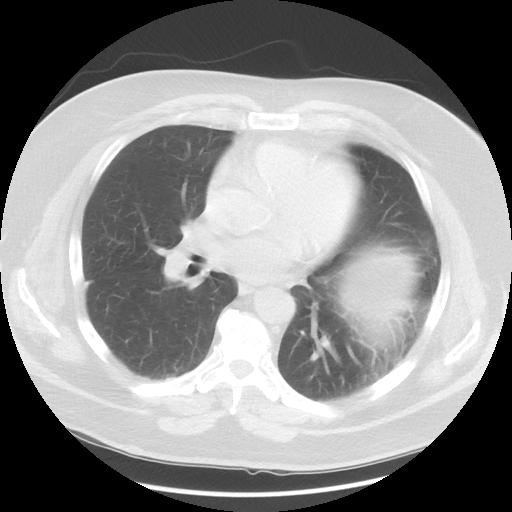
[im 47/70  lung]
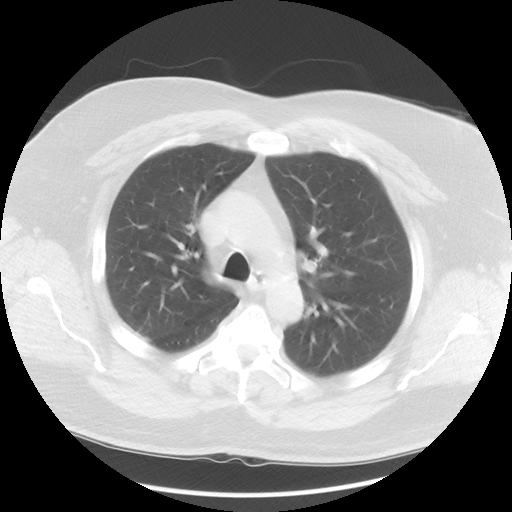
[im 58/70  mediastinal]
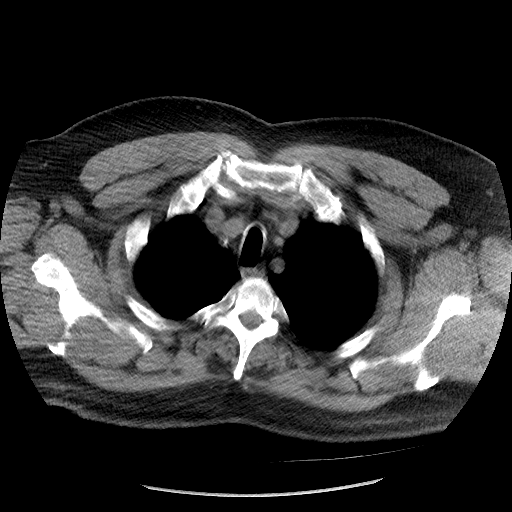
[im 58/70  lung]
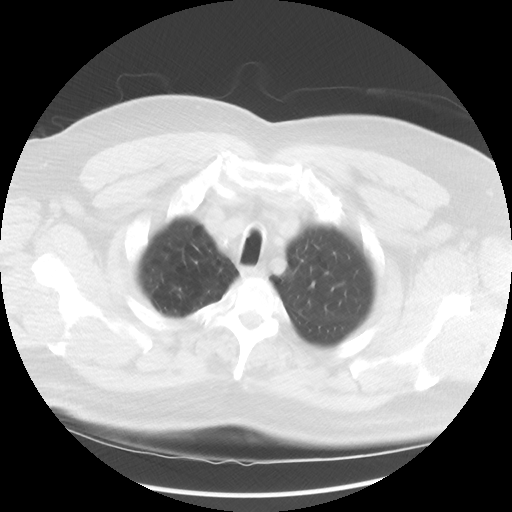

[Series 4: lung windows · axial · 0.84mm/px · z∈[-254,-44]mm · 4 of 70 slices shown]
[im 14/70  lung]
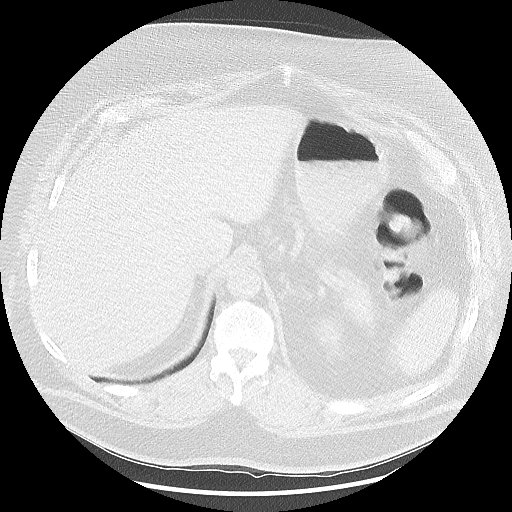
[im 28/70  lung]
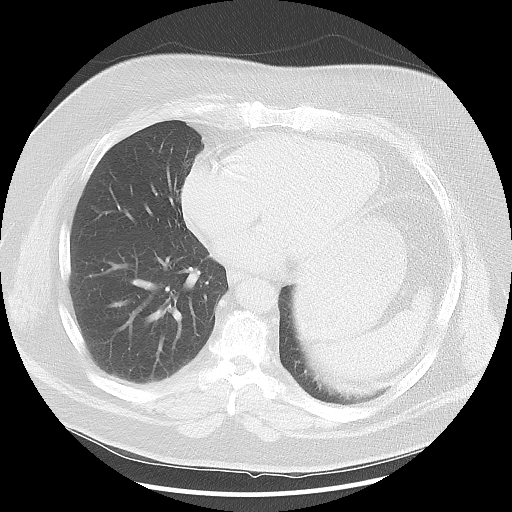
[im 42/70  lung]
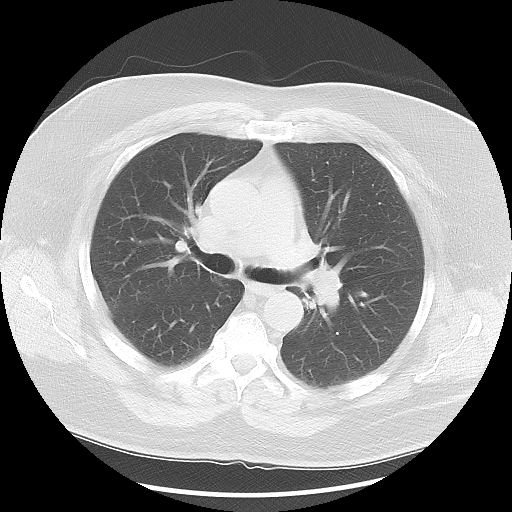
[im 56/70  lung]
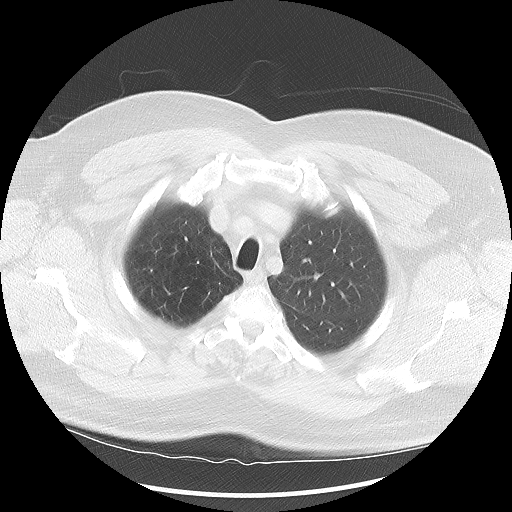

[Series 602: sagittal body · sagittal · 0.84mm/px · 8 of 173 slices shown]
[im 13/173  mediastinal]
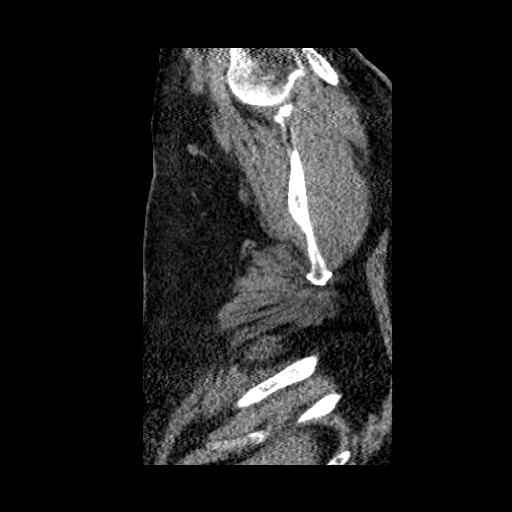
[im 37/173  mediastinal]
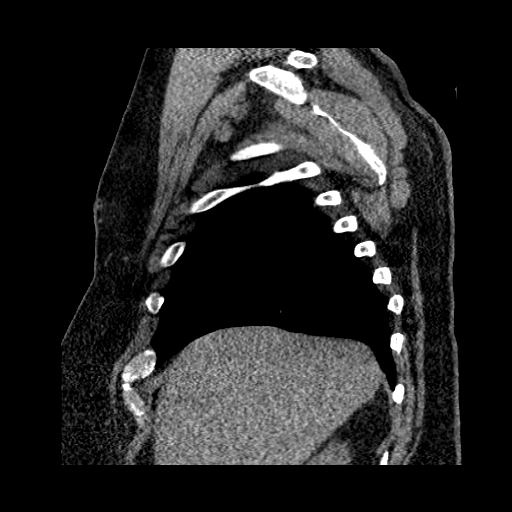
[im 62/173  mediastinal]
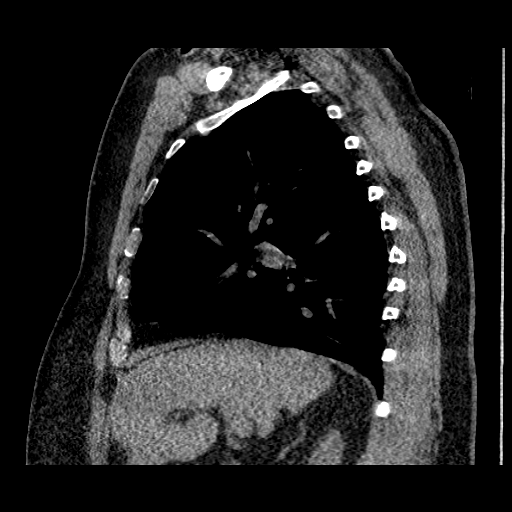
[im 74/173  mediastinal]
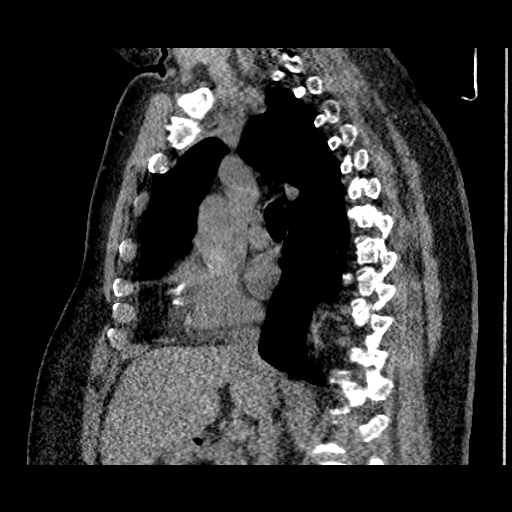
[im 99/173  mediastinal]
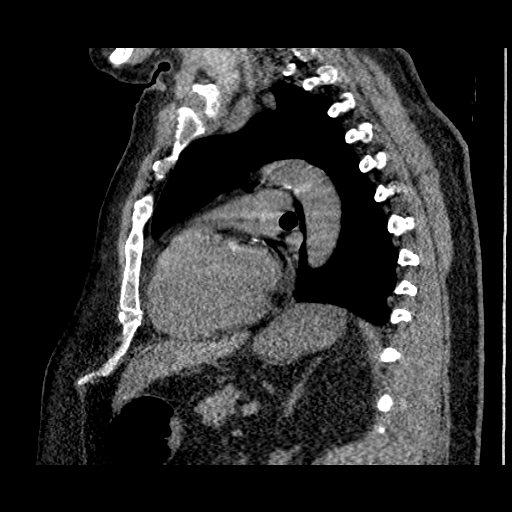
[im 111/173  mediastinal]
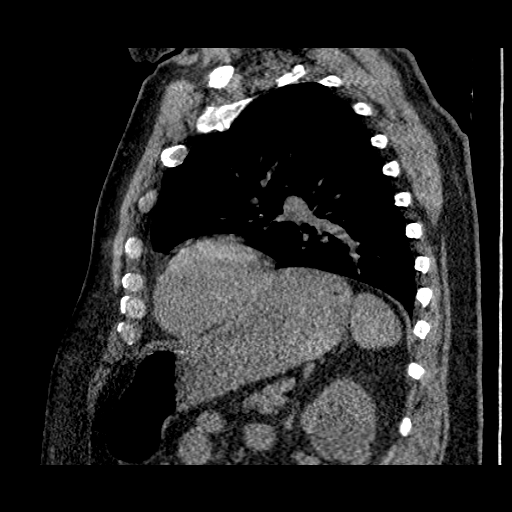
[im 136/173  mediastinal]
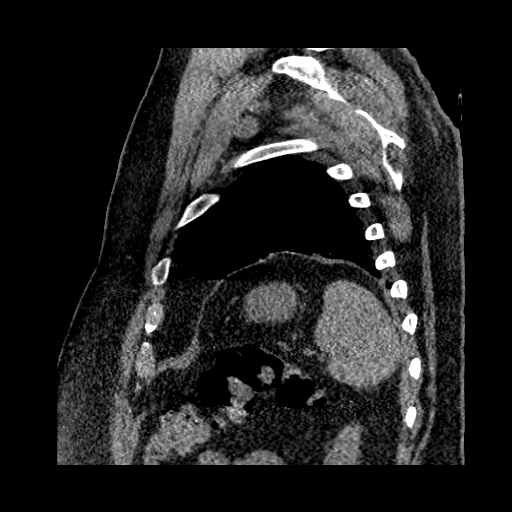
[im 160/173  mediastinal]
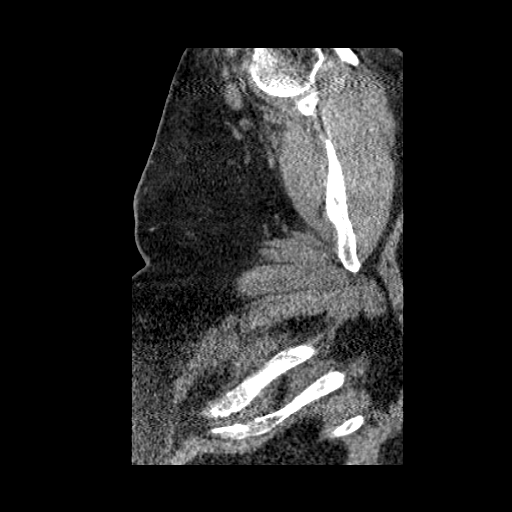

[17 of 30 positions shown; findings below may reference images not displayed]

FINDINGS: Mild emphysema. 4 mm peripheral left upper lobe nodule on image 34
is stable. Mild scarring at the left base with elevation of the left
hemidiaphragm, stable. No new pulmonary nodules.

Heart is normal size. Aorta is normal caliber. Diffuse coronary
artery calcifications. No mediastinal, hilar, or axillary
adenopathy. Chest wall soft tissues are unremarkable. Imaging into
the upper abdomen shows no acute findings. No acute bony abnormality
or focal bone lesion.
IMPRESSION: Stable 4 mm left upper lobe nodule dating back to 2026. This is
compatible with a benign nodule. No additional follow-up necessary.

Mild emphysema.

Coronary artery disease.

## 2017-11-01 ENCOUNTER — Telehealth: Payer: Self-pay | Admitting: Family Medicine

## 2017-11-01 NOTE — Telephone Encounter (Signed)
Left a voicemail for Mr. Malik [name] in regards to his appt he has with Dr. Neva SeatGreene on 02/10/2018. The provider will not be in the office on that day and would need to be rescheduled.

## 2018-02-10 ENCOUNTER — Ambulatory Visit: Payer: Medicare Other | Admitting: Family Medicine

## 2018-03-11 ENCOUNTER — Telehealth: Payer: Self-pay | Admitting: Family Medicine

## 2018-03-11 NOTE — Telephone Encounter (Signed)
L/m for pt regarding overdue for CPE.  Requested  that pt contact office to have scheduled. °

## 2018-03-14 ENCOUNTER — Telehealth: Payer: Self-pay | Admitting: Family Medicine

## 2018-03-14 DIAGNOSIS — R195 Other fecal abnormalities: Secondary | ICD-10-CM

## 2018-03-14 NOTE — Telephone Encounter (Signed)
Left message to call back for lab results.  Cologuard paper report received with positive result.   Unfortunately as a screening test this does not always give us all the information we need.  Test can be positive for multiple reasons, so next step would be evaluation with gastroenterology and likely colonoscopy to look at screening further.  I am happy to discuss this with him if he would like but should see gastroenterology as next up in process.  Please let me know if there are questions or if he would like a phone call directly from me I am happy to do so.

## 2018-03-17 NOTE — Telephone Encounter (Signed)
I have called the pt and informed him of provider message below. Pt stated that he sees Dr. Judie PetitMalcolm stark and he will make an appointment.   Thanks, [employer]

## 2018-03-17 NOTE — Telephone Encounter (Signed)
I have placed the GI referral in the system.  Thanks, [employer]

## 2018-04-28 ENCOUNTER — Ambulatory Visit: Admit: 2018-04-28 | Discharge: 2018-04-28 | Payer: BLUE CROSS/BLUE SHIELD | Attending: Family | Primary: Family

## 2018-04-28 DIAGNOSIS — Z0000 Encounter for general adult medical examination without abnormal findings: Secondary | ICD-10-CM

## 2018-04-28 MED ORDER — BENZONATATE 100 MG PO CAPS
100 MG | ORAL_CAPSULE | Freq: Three times a day (TID) | ORAL | 0 refills | Status: DC | PRN
Start: 2018-04-28 — End: 2019-05-04

## 2018-04-28 NOTE — Telephone Encounter (Signed)
Patient would also like a lab order to check his pancreas due to his diabetes  Call when ready  [phone]

## 2018-04-28 NOTE — Telephone Encounter (Signed)
Left message asking why he wanted the test and asked him to call the office per [facility].

## 2018-04-28 NOTE — Telephone Encounter (Signed)
He does not have diabetes>? I just saw him today and he made no mention of abd. Pain? Nausea?

## 2018-04-28 NOTE — Progress Notes (Signed)
Visit Information    Have you changed or started any medications since your last visit including any over-the-counter medicines, vitamins, or herbal medicines? no   Have you stopped taking any of your medications? Is so, why? -  no  Are you having any side effects from any of your medications? - no    Have you seen any other physician or provider since your last visit?  no   Have you had any other diagnostic tests since your last visit?  no   Have you been seen in the emergency room and/or had an admission in a hospital since we last saw you?  no   Have you had your routine dental cleaning in the past 6 months?  no     Do you have an active MyChart account? If no, what is the barrier?  No: na    Patient Care Team:  Tanna Furry, APRN - CNP as PCP - General (Nurse Practitioner)  Tanna Furry, APRN - CNP as PCP - [facility] Empaneled Provider  Alger Memos, MD    Medical History Review  Past Medical, Family, and Social History reviewed and  contribute to the patient presenting condition    Health Maintenance   Topic Date Due   . Shingles Vaccine (1 of 2) 12/28/1999   . Flu vaccine (1) 11/24/2017   . TSH testing  08/15/2018   . Colon cancer screen colonoscopy  07/26/2020   . Lipid screen  04/30/2022   . DTaP/Tdap/Td vaccine (2 - Td) 09/30/2023   . Pneumococcal 65+ years Vaccine  Completed   . AAA screen  Completed   . Hepatitis C screen  Addressed

## 2018-04-28 NOTE — Telephone Encounter (Signed)
Patient wanted to check to see if his pancreas was producing enough insulin

## 2018-04-28 NOTE — Progress Notes (Signed)
Medicare Annual Wellness Visit  Name: Dustin Zimmerman Today???s Date: 04/28/2018   MRN: [id number] Sex: Male   Age: 68 y.o. Ethnicity: Non-Hispanic/Non Latino   DOB: 10-25-1950 Race: Cobi Tafel is here for Medicare AWV    Screenings for behavioral, psychosocial and functional/safety risks, and cognitive dysfunction are all negative except as indicated below. These results, as well as other patient data from the Health Risk Assessment form, are documented in Flowsheets linked to this Encounter.    Allergies   Allergen Reactions   ??? Motrin [Ibuprofen] Other (See Comments)     kidney   ??? Percocet [Oxycodone-Acetaminophen] Hives   ??? Motrin [Ibuprofen] Nausea And Vomiting     kidney   ??? Percocet [Oxycodone-Acetaminophen] Rash         Prior to Visit Medications    Medication Sig Taking? Authorizing Provider   benzonatate (TESSALON PERLES) 100 MG capsule Take 1 capsule by mouth 3 times daily as needed for Cough Yes Tanna Furry, APRN - CNP   levothyroxine (SYNTHROID) 50 MCG tablet TAKE 1 TABLET DAILY  Tanna Furry, APRN - CNP   famotidine (PEPCID) 40 MG tablet Take 1 tablet by mouth every evening  Tanna Furry, APRN - CNP   dorzolamide-timolol (COSOPT) 22.3-6.8 MG/ML ophthalmic solution Place 2 drops into both eyes every 12 hours  Alger Memos, MD   prednisoLONE acetate (PRED FORTE) 1 % ophthalmic suspension   Historical Provider, MD         Past Medical History:   Diagnosis Date   ??? 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 AK (actinic keratosis) 04/17/2017   ??? Hypogonadism    ??? Hypothyroid    ??? Lumbago    ??? Malaise and fatigue    ??? Myalgia    ??? Polyp of colon 04/17/2017       Past Surgical History:   Procedure Laterality Date   ??? EYE SURGERY     ??? FOOT SURGERY      middle toe   ??? KNEE SURGERY     ??? SHOULDER SURGERY           Family History   Problem Relation Age of Onset   ??? No Known Problems Mother    ??? Other Father         aspiration pneumonia   ??? Diabetes Brother        CareTeam (Including outside  providers/suppliers regularly involved in providing care):   Patient Care Team:  Tanna Furry, APRN - CNP as PCP - General (Nurse Practitioner)  Tanna Furry, APRN - CNP as PCP - [facility] Empaneled Provider  Alger Memos, MD    Wt Readings from Last 3 Encounters:   04/28/18 210 lb (95.3 kg)   06/19/17 209 lb (94.8 kg)   04/17/17 213 lb (96.6 kg)     Vitals:    04/28/18 1116   BP: 132/80   Site: Right Upper Arm   Position: Sitting   Cuff Size: Medium Adult   Pulse: 73   Temp: 97.1 ??F (36.2 ??C)   TempSrc: Tympanic   SpO2: 97%   Weight: 210 lb (95.3 kg)   Height: 6' 0.01" (1.829 m)     Body mass index is 28.47 kg/m??.    Based upon direct observation of the patient, evaluation of cognition reveals recent and remote memory intact.    General Appearance: alert and oriented to person, place and time, well developed  and well- nourished, in no acute distress  Skin: warm and dry, no rash or erythema  Head: normocephalic and atraumatic  Eyes: pupils equal, round, and reactive to light, extraocular eye movements intact, conjunctivae normal  ENT: tympanic membrane, external ear and ear canal normal bilaterally, nose without deformity, nasal mucosa and turbinates normal without polyps  Pulmonary/Chest: clear to auscultation bilaterally- no wheezes, rales or rhonchi, normal air movement, no respiratory distress  Cardiovascular: normal rate, regular rhythm, normal S1 and S2, no murmurs, rubs, clicks, or gallops, distal pulses intact, no carotid bruits  Extremities: no cyanosis, clubbing or edema  Musculoskeletal: normal range of motion, no joint swelling, deformity or tenderness    Patient's complete Health Risk Assessment and screening values have been reviewed and are found in Flowsheets. The following problems were reviewed today and where indicated follow up appointments were made and/or referrals ordered.    Positive Risk Factor Screenings with Interventions:     General Health:  General  In general, how would you say your  health is?: Very Good  In the past 7 days, have you experienced any of the following? New or Increased Pain, New or Increased Fatigue, Loneliness, Social Isolation, Stress or Anger?: (!) New or Increased Fatigue  Do you get the social and emotional support that you need?: Yes  Do you have a Living Will?: Yes  General Health Risk Interventions:  ?? Fatigue: patient declines any further evaluation/treatment for this issue    Hearing/Vision:  No exam data present  Hearing/Vision  Do you or your family notice any trouble with your hearing?: (!) Yes  Do you have difficulty driving, watching TV, or doing any of your daily activities because of your eyesight?: (!) Yes("not like I used to")  Have you had an eye exam within the past year?: Yes  Hearing/Vision Interventions:  ?? Hearing concerns:  patient declines any further evaluation/treatment for hearing issues  ?? Vision concerns:  patient declines any further evaluation/treatment for this issue    Safety:  Safety  Do you have working smoke detectors?: Yes  Have all throw rugs been removed or fastened?: (!) No  Do you have non-slip mats or surfaces in all bathtubs/showers?: (!) No  Do all of your stairways have a railing or banister?: (!) No  Are your doorways, halls and stairs free of clutter?: Yes  Do you always fasten your seatbelt when you are in a car?: Yes  Safety Interventions:  ?? Home safety tips provided    Personalized Preventive Plan   Current Health Maintenance Status  Immunization History   Administered Date(s) Administered   ??? Pneumococcal Conjugate 13-valent (Prevnar13) 12/02/2015   ??? Pneumococcal Polysaccharide (Pneumovax23) 03/27/2017, 04/23/2017   ??? Tdap (Boostrix, Adacel) 09/29/2013, 06/13/2016        Health Maintenance   Topic Date Due   ??? Shingles Vaccine (1 of 2) 12/28/1999   ??? Flu vaccine (1) 04/29/2019 (Originally 11/24/2017)   ??? TSH testing  08/15/2018   ??? Colon cancer screen colonoscopy  07/26/2020   ??? Lipid screen  04/30/2022   ??? DTaP/Tdap/Td  vaccine (3 - Td) 06/14/2026   ??? Pneumococcal 65+ years Vaccine  Completed   ??? AAA screen  Completed   ??? Hepatitis C screen  Addressed     Recommendations for Preventive Services Due: see orders and patient instructions/AVS.  .  Recommended screening schedule for the next 5-10 years is provided to the patient in written form: see Patient Instructions/AVS.    Dustin Zimmerman was  seen today for medicare awv.    Diagnoses and all orders for this visit:    Medicare annual wellness visit, subsequent  -     AST; Future  -     ALT; Future    Hypothyroidism, unspecified type  -     TSH without Reflex; Future  -     T4, Free; Future    Screening for lipoid disorders  -     Lipid Panel; Future    Urinary frequency  -     Psa screening; Future  -     Basic Metabolic Panel; Future  -     PTH, Intact; Future    Acute bronchitis, unspecified organism  -     benzonatate (TESSALON PERLES) 100 MG capsule; Take 1 capsule by mouth 3 times daily as needed for Cough    Cough  -     CBC Auto Differential; Future    Special screening for malignant neoplasm of prostate  -     Psa screening; Future    Routine general medical examination at a health care facility        pt overall doing well  Tessalon refilled for lingering cough last 2 weeks, declines more tx or evaluation for now  Update labs this spring when due  HO given for bladder irritating food and drink, call if not better will then decide if flomax needed

## 2018-04-28 NOTE — Telephone Encounter (Signed)
If his pancreas was not producing insulin he would have really high blood sugars, which I gave him orders for labs to check that. I would stick with what we already ordered for now

## 2018-04-28 NOTE — Patient Instructions (Signed)
Personalized Preventive Plan for Dustin Zimmerman - 04/28/2018  Medicare offers a range of preventive health benefits. Some of the tests and screenings are paid in full while other may be subject to a deductible, co-insurance, and/or copay.    Some of these benefits include a comprehensive review of your medical history including lifestyle, illnesses that may run in your family, and various assessments and screenings as appropriate.    After reviewing your medical record and screening and assessments performed today your provider may have ordered immunizations, labs, imaging, and/or referrals for you.  A list of these orders (if applicable) as well as your Preventive Care list are included within your After Visit Summary for your review.    Other Preventive Recommendations:    ?? A preventive eye exam performed by an eye specialist is recommended every 1-2 years to screen for glaucoma; cataracts, macular degeneration, and other eye disorders.  ?? A preventive dental visit is recommended every 6 months.  ?? Try to get at least 150 minutes of exercise per week or 10,000 steps per day on a pedometer .  ?? Order or download the FREE "Exercise & Physical Activity: Your Everyday Guide" from [employer]. Call 906-337-4343 or search [employer] online.  ?? You need 1200-1500 mg of calcium and 1000-2000 IU of vitamin D per day. It is possible to meet your calcium requirement with diet alone, but a vitamin D supplement is usually necessary to meet this goal.  ?? When exposed to the sun, use a sunscreen that protects against both UVA and UVB radiation with an SPF of 30 or greater. Reapply every 2 to 3 hours or after sweating, drying off with a towel, or swimming.  ?? Always wear a seat belt when traveling in a car. Always wear a helmet when riding a bicycle or motorcycle.

## 2018-04-29 NOTE — Telephone Encounter (Signed)
Left detailed message explaining that she does not want to have him get extra labs at this time and she already ordered labs to check his sugar.

## 2018-05-01 ENCOUNTER — Other Ambulatory Visit: Payer: Self-pay | Admitting: Family Medicine

## 2018-05-01 DIAGNOSIS — E785 Hyperlipidemia, unspecified: Secondary | ICD-10-CM

## 2018-05-01 DIAGNOSIS — I10 Essential (primary) hypertension: Secondary | ICD-10-CM

## 2018-05-01 LAB — BASIC METABOLIC PANEL
BUN: 19 mg/dL
CO2: 29 mmol/L
Calcium: 8.9 mg/dL
Chloride: 103 mmol/L
Creatinine: 0.86
Glucose: 97 mg/dL
Potassium: 4.3 mmol/L
Sodium: 141 mmol/L

## 2018-05-01 LAB — CBC WITH AUTO DIFFERENTIAL

## 2018-05-01 LAB — ALT: ALT: 31 U/L

## 2018-05-01 LAB — PSA SCREENING: PSA: 1.29 ng/mL

## 2018-05-01 LAB — LIPID PANEL
Chol/HDL Ratio: 4
Cholesterol, Total: 197 mg/dL
HDL: 49 mg/dL (ref 35–70)
LDL Calculated: 133 mg/dL (ref 0–160)
Triglycerides: 75 mg/dL
VLDL: 15 mg/dL

## 2018-05-01 LAB — AST: AST: 29 U/L

## 2018-05-01 LAB — TSH: TSH: 2.8 u[IU]/mL

## 2018-05-01 LAB — T4, FREE: T4 Free: 0.86

## 2018-05-01 LAB — PTH, INTACT: Pth Intact: 39

## 2018-05-01 NOTE — Telephone Encounter (Signed)
Refill requests for lisinopril, metoprolol, and simvastatin; last office visit 08/12/17; no upcoming visits noted; contacted pt and he states that he has not requested refills; he also says that he has changed to another primary care; will refuse refill, and route to office for notification; pt last seen by Dr Meredith Staggers, Ernesto Rutherford

## 2018-05-02 ENCOUNTER — Encounter

## 2018-06-19 ENCOUNTER — Encounter: Payer: Self-pay | Admitting: Family Medicine

## 2018-06-22 MED ORDER — LEVOTHYROXINE SODIUM 50 MCG PO TABS
50 MCG | ORAL_TABLET | ORAL | 3 refills | Status: DC
Start: 2018-06-22 — End: 2019-06-17

## 2018-06-22 NOTE — Telephone Encounter (Signed)
lov 04/28/2018

## 2018-07-07 NOTE — Telephone Encounter (Signed)
Patient called requesting to speak to Okey Regal regarding a bill from 02/12/2017. He states Okey Regal knows the situation and would like her to contact him as soon as possible. He states the ringer on his phone is not working, so please leave a message and he will call back.  Thank you.

## 2019-04-24 ENCOUNTER — Ambulatory Visit: Payer: Medicare Other

## 2019-04-30 ENCOUNTER — Ambulatory Visit: Payer: Medicare Other | Attending: Internal Medicine

## 2019-04-30 DIAGNOSIS — Z23 Encounter for immunization: Secondary | ICD-10-CM

## 2019-04-30 NOTE — Progress Notes (Signed)
   Covid-19 Vaccination Clinic  Name:  Malik [name]    MRN: [id number] DOB: Nov 22, 1950  04/30/2019  Malik [name] was observed post Covid-19 immunization for 15 minutes without incidence. He was provided with Vaccine Information Sheet and instruction to access the V-Safe system.   Malik [name] was instructed to call 911 with any severe reactions post vaccine: Marland Kitchen Difficulty breathing  . Swelling of your face and throat  . A fast heartbeat  . A bad rash all over your body  . Dizziness and weakness    Immunizations Administered    Name Date Dose VIS Date Route   Pfizer COVID-19 Vaccine 04/30/2019 12:27 PM 0.3 mL 03/06/2019 Intramuscular   Manufacturer: [employer], [employer]   Lot: [id number]   NDC: [address]-9

## 2019-05-01 ENCOUNTER — Ambulatory Visit: Payer: Medicare Other

## 2019-05-04 ENCOUNTER — Ambulatory Visit: Admit: 2019-05-04 | Discharge: 2019-05-04 | Payer: BLUE CROSS/BLUE SHIELD | Attending: Family | Primary: Family

## 2019-05-04 DIAGNOSIS — Z0000 Encounter for general adult medical examination without abnormal findings: Secondary | ICD-10-CM

## 2019-05-04 MED ORDER — NYSTATIN 100000 UNIT/GM EX OINT
100000 UNIT/GM | CUTANEOUS | 1 refills | Status: AC
Start: 2019-05-04 — End: ?

## 2019-05-04 MED ORDER — OMEPRAZOLE 40 MG PO CPDR
40 MG | ORAL_CAPSULE | Freq: Every day | ORAL | 0 refills | Status: DC
Start: 2019-05-04 — End: 2019-06-03

## 2019-05-04 NOTE — Telephone Encounter (Signed)
Looks like he would have to go to [place], is he ok with [place]? If not he needs to see who he can see in [place]

## 2019-05-04 NOTE — Telephone Encounter (Signed)
Patient due for colonoscopy and would like referral by PCP.  Was going to schedule at the [facility] but they no longer take his insurance.    CB [phone]

## 2019-05-04 NOTE — Telephone Encounter (Signed)
Again I do not know who he can see in [place], please check with [employer] and let me know name

## 2019-05-04 NOTE — Progress Notes (Signed)
Medicare Annual Wellness Visit  Name: Dustin Zimmerman Today???s Date: 05/04/2019   MRN: [id number] Sex: Male   Age: 69 y.o. Ethnicity: Non-Hispanic/Non Latino   DOB: [birth year] Race: Dustin Zimmerman is here for Medicare AWV (would like to have lab to ck blood type)    Screenings for behavioral, psychosocial and functional/safety risks, and cognitive dysfunction are all negative except as indicated below. These results, as well as other patient data from the Health Risk Assessment form, are documented in Flowsheets linked to this Encounter.    Allergies   Allergen Reactions   ??? Motrin [Ibuprofen] Other (See Comments)     kidney   ??? Percocet [Oxycodone-Acetaminophen] Hives   ??? Motrin [Ibuprofen] Nausea And Vomiting     kidney   ??? Percocet [Oxycodone-Acetaminophen] Rash         Prior to Visit Medications    Medication Sig Taking? Authorizing Provider   omeprazole (PRILOSEC) 40 MG delayed release capsule Take 1 capsule by mouth every morning (before breakfast) Yes Tanna Furry, APRN - CNP   nystatin (MYCOSTATIN) 100000 UNIT/GM ointment Apply topically 2 times daily. Yes Tanna Furry, APRN - CNP   levothyroxine (SYNTHROID) 50 MCG tablet TAKE 1 TABLET DAILY Yes Tanna Furry, APRN - CNP   dorzolamide-timolol (COSOPT) 22.3-6.8 MG/ML ophthalmic solution Place 2 drops into both eyes every 12 hours  Alger Memos, MD         Past Medical History:   Diagnosis Date   ??? 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 AK (actinic keratosis) 04/17/2017   ??? Hypogonadism    ??? Hypothyroid    ??? Lumbago    ??? Malaise and fatigue    ??? 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 Myalgia    ??? Polyp of colon 04/17/2017       Past Surgical History:   Procedure Laterality Date   ??? EYE SURGERY     ??? FOOT SURGERY      middle toe   ??? KNEE SURGERY     ??? SHOULDER SURGERY           Family History   Problem Relation Age of Onset   ??? No Known Problems Mother    ??? Other Father         aspiration pneumonia   ??? Diabetes Brother         CareTeam (Including outside providers/suppliers regularly involved in providing care):   Patient Care Team:  Tanna Furry, APRN - CNP as PCP - General (Nurse Practitioner)  Tanna Furry, APRN - CNP as PCP - [facility] Empaneled Provider  Alger Memos, MD    Wt Readings from Last 3 Encounters:   05/04/19 226 lb (102.5 kg)   04/28/18 210 lb (95.3 kg)   06/19/17 209 lb (94.8 kg)     Vitals:    05/04/19 1045   BP: 132/70   Site: Right Upper Arm   Position: Sitting   Cuff Size: Medium Adult   Pulse: 72   Temp: 96 ??F (35.6 ??C)   TempSrc: Tympanic   SpO2: 98%   Weight: 226 lb (102.5 kg)   Height: 6' (1.829 m)     Body mass index is 30.65 kg/m??.    Based upon direct observation of the patient, evaluation of cognition reveals recent and remote memory intact.    General Appearance: alert and oriented to person, place and time, well-developed  and well-nourished, in no acute distress  Pulmonary/Chest: clear to auscultation bilaterally- no wheezes, rales or rhonchi, normal air movement, no respiratory distress  Cardiovascular: normal rate, normal S1 and S2, no gallops, intact distal pulses and no carotid bruits  Abdomen: soft, non-tender, non-distended, normal bowel sounds, no masses or organomegaly  Neurologic: gait and coordination normal and speech normal    Patient's complete Health Risk Assessment and screening values have been reviewed and are found in Flowsheets. The following problems were reviewed today and where indicated follow up appointments were made and/or referrals ordered.    Positive Risk Factor Screenings with Interventions:            General Health and ACP:  General  In general, how would you say your health is?: Very Good  In the past 7 days, have you experienced any of the following? New or Increased Pain, New or Increased Fatigue, Loneliness, Social Isolation, Stress or Anger?: (!) New or Increased Pain  Do you get the social and emotional support that you need?: Yes  Do you have a Living Will?:  Yes  Advance Directives     Power of Attorney Living Will ACP-Advance Directive ACP-Power of Attorney    Not on File Not on File Not on File Not on File      General Health Risk Interventions:  ?? Pain issues: see other note    Health Habits/Nutrition:  Health Habits/Nutrition  Do you exercise for at least 20 minutes 2-3 times per week?: Yes  Have you lost any weight without trying in the past 3 months?: No  Do you eat only one meal per day?: No  Have you seen the dentist within the past year?: Yes  Body mass index: (!) 30.65  Health Habits/Nutrition Interventions:  ?? Nutritional issues:  patient is not ready to address his/her nutritional/weight issues at this time    Hearing/Vision:  No exam data present  Hearing/Vision  Do you or your family notice any trouble with your hearing that hasn't been managed with hearing aids?: (!) Yes  Do you have difficulty driving, watching TV, or doing any of your daily activities because of your eyesight?: (!) Yes  Have you had an eye exam within the past year?: Appointment is scheduled(05/06/19)  Hearing/Vision Interventions:  ?? Hearing concerns:  patient declines any further evaluation/treatment for hearing issues  ?? Vision concerns:  patient encouraged to make appointment with his/her eye specialist    Safety:  Safety  Do you have working smoke detectors?: Yes  Have all throw rugs been removed or fastened?: Yes  Do you have non-slip mats or surfaces in all bathtubs/showers?: (!) No  Do all of your stairways have a railing or banister?: (!) No(no stairs)  Are your doorways, halls and stairs free of clutter?: Yes  Do you always fasten your seatbelt when you are in a car?: Yes  Safety Interventions:  ?? Home safety tips provided     Personalized Preventive Plan   Current Health Maintenance Status  Immunization History   Administered Date(s) Administered   ??? Pneumococcal Conjugate 13-valent (Prevnar13) 12/02/2015   ??? Pneumococcal Polysaccharide (Pneumovax23) 03/27/2017, 04/23/2017    ??? Tdap (Boostrix, Adacel) 09/29/2013, 06/13/2016        Health Maintenance   Topic Date Due   ??? TSH testing  05/02/2019   ??? Flu vaccine (1) 05/03/2020 (Originally 11/25/2018)   ??? Shingles Vaccine (1 of 2) 05/03/2020 (Originally 12/28/1999)   ??? Colon cancer screen colonoscopy  07/26/2020   ???  Lipid screen  05/02/2023   ??? DTaP/Tdap/Td vaccine (3 - Td) 06/14/2026   ??? Pneumococcal 65+ years Vaccine  Completed   ??? AAA screen  Completed   ??? Hepatitis C screen  Addressed   ??? Hepatitis A vaccine  Aged Out   ??? Hepatitis B vaccine  Aged Out   ??? Hib vaccine  Aged Out   ??? Meningococcal (ACWY) vaccine  Aged Out     Recommendations for Preventive Services Due: see orders and patient instructions/AVS.  .  Recommended screening schedule for the next 5-10 years is provided to the patient in written form: see Patient Instructions/AVS.    Dustin Zimmerman was seen today for medicare awv.    Diagnoses and all orders for this visit:    Medicare annual wellness visit, subsequent    LUQ pain  -     CBC With Auto Differential; Future  -     Comprehensive Metabolic Panel; Future  -     Lipase; Future  -     omeprazole (PRILOSEC) 40 MG delayed release capsule; Take 1 capsule by mouth every morning (before breakfast)    Intertrigo  -     nystatin (MYCOSTATIN) 100000 UNIT/GM ointment; Apply topically 2 times daily.    Routine general medical examination at a health care facility  -     Lipid Panel; Future  -     CBC With Auto Differential; Future  -     Comprehensive Metabolic Panel; Future  -     PSA Screening; Future  -     Hemoglobin A1C; Future    Screening for lipoid disorders  -     Lipid Panel; Future    Hypothyroidism, unspecified type  -     TSH; Future  -     T4; Future    Special screening for malignant neoplasm of prostate  -     PSA Screening; Future    Screening for diabetes mellitus  -     Hemoglobin A1C; Future           Declines shingles vaccines  Patient advised to follow heart healthy, low fat  diet and 150 minutes of cardiovascular  exercise per week   Avoid tobacco, drugs and alcohol  See other note  Update routine labs  Continue with other specialists    [facility]  [address]  [place], [place] [id number]  [phone]      Dustin Zimmerman is a 69 y.o. male who presents today for his  medicalconditions/complaints as noted below.  Dustin Zimmerman is c/o of Medicare AWV (would like to have lab to ck blood type)  .    HPI:    Pt. Here today for AMV. Has two other concerns:    Has itchy red rash in bilateral groin. No drainage or pain just irritated. Has been sweating more there lately.     abd pain:  LUQ ongoing for past 2 weeks. Has had similar problem in the past and GI at [facility] gave him med that resolved it but unsure of name. He plans to make appt for colonoscopy there tomorrow as he is due and will reques this info. He has had some days where he will have 7-9 BM's, with some diarrhea. 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 He has been taking probiotics. He does feel gassy and bloated also.       Past  Medical History:   Diagnosis Date   ??? 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 AK (actinic keratosis) 04/17/2017   ??? Hypogonadism    ??? Hypothyroid    ??? Lumbago    ??? Malaise and fatigue    ??? 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 Myalgia    ??? Polyp of colon 04/17/2017      Past Surgical History:   Procedure Laterality Date   ??? EYE SURGERY     ??? FOOT SURGERY      middle toe   ??? KNEE SURGERY     ??? SHOULDER SURGERY       Family History   Problem Relation Age of Onset   ??? No Known Problems Mother    ??? Other Father         aspiration pneumonia   ??? Diabetes Brother      Social History     Tobacco Use   ??? Smoking status: Former Smoker     Packs/day: 1.00     Years: 10.00     Pack years: 10.00     Types: Pipe   ??? Smokeless tobacco: Never Used   Substance Use Topics   ??? Alcohol use: Yes     Alcohol/week: 0.0 standard drinks     Comment: occ      Current Outpatient  Medications   Medication Sig Dispense Refill   ??? omeprazole (PRILOSEC) 40 MG delayed release capsule Take 1 capsule by mouth every morning (before breakfast) 30 capsule 0   ??? nystatin (MYCOSTATIN) 100000 UNIT/GM ointment Apply topically 2 times daily. 30 g 1   ??? levothyroxine (SYNTHROID) 50 MCG tablet TAKE 1 TABLET DAILY 90 tablet 3   ??? dorzolamide-timolol (COSOPT) 22.3-6.8 MG/ML ophthalmic solution Place 2 drops into both eyes every 12 hours 1 Bottle 3     No current facility-administered medications for this visit.      Allergies   Allergen Reactions   ??? Motrin [Ibuprofen] Other (See Comments)     kidney   ??? Percocet [Oxycodone-Acetaminophen] Hives   ??? Motrin [Ibuprofen] Nausea And Vomiting     kidney   ??? Percocet [Oxycodone-Acetaminophen] Rash       Health Maintenance   Topic Date Due   ??? TSH testing  05/02/2019   ??? Flu vaccine (1) 05/03/2020 (Originally 11/25/2018)   ??? Shingles Vaccine (1 of 2) 05/03/2020 (Originally 12/28/1999)   ??? Colon cancer screen colonoscopy  07/26/2020   ??? Lipid screen  05/02/2023   ??? DTaP/Tdap/Td vaccine (3 - Td) 06/14/2026   ??? Pneumococcal 65+ years Vaccine  Completed   ??? AAA screen  Completed   ??? Hepatitis C screen  Addressed   ??? Hepatitis A vaccine  Aged Out   ??? Hepatitis B vaccine  Aged Out   ??? Hib vaccine  Aged Out   ??? Meningococcal (ACWY) vaccine  Aged Out       Subjective:      Review of Systems   Constitutional: Negative for activity change, appetite change, chills, diaphoresis, fatigue, fever and unexpected weight change (gain, d/t not exercising with covid).   Eyes: Negative for visual disturbance.   Respiratory: Negative for cough, chest tightness and shortness of breath.    Cardiovascular: Negative for chest pain, palpitations and leg swelling.   Gastrointestinal: Positive for abdominal distention (bloated) and abdominal pain. Negative for blood in stool, constipation, diarrhea, nausea, rectal pain and vomiting.   Genitourinary: Negative for decreased urine  volume and difficulty  urinating.   Skin: Positive for rash (groin).   Neurological: Negative for dizziness, weakness, numbness and headaches.   Hematological: Negative for adenopathy.   Psychiatric/Behavioral: Negative for sleep disturbance.         Objective:      Physical Exam  Vitals signs and nursing note reviewed.   Constitutional:       General: He is not in acute distress.     Appearance: Normal appearance. He is well-developed. He is not ill-appearing or diaphoretic.   HENT:      Head: Normocephalic and atraumatic.   Eyes:      Conjunctiva/sclera: Conjunctivae normal.   Neck:      Musculoskeletal: Neck supple.   Cardiovascular:      Rate and Rhythm: Normal rate and regular rhythm.      Heart sounds: Normal heart sounds.      Comments: No LE edema  Pulmonary:      Effort: Pulmonary effort is normal.      Breath sounds: Normal breath sounds.   Abdominal:      General: Abdomen is flat. Bowel sounds are normal.      Tenderness: There is abdominal tenderness (LLUQ). There is no guarding or rebound.      Hernia: No hernia is present.   Skin:     General: Skin is warm and dry.   Neurological:      General: No focal deficit present.      Mental Status: He is alert and oriented to person, place, and time. Mental status is at baseline.   Psychiatric:         Mood and Affect: Mood normal.         Behavior: Behavior normal.         Thought Content: Thought content normal.         Judgment: Judgment normal.         Assessment:       Diagnosis Orders   1. Medicare annual wellness visit, subsequent     2. LUQ pain  CBC With Auto Differential    Comprehensive Metabolic Panel    Lipase    omeprazole (PRILOSEC) 40 MG delayed release capsule   3. Intertrigo  nystatin (MYCOSTATIN) 100000 UNIT/GM ointment   4. Routine general medical examination at a health care facility  Lipid Panel    CBC With Auto Differential    Comprehensive Metabolic Panel    PSA Screening    Hemoglobin A1C   5. Screening for lipoid disorders  Lipid Panel   6. Hypothyroidism,  unspecified type  TSH    T4   7. Special screening for malignant neoplasm of prostate  PSA Screening   8. Screening for diabetes mellitus  Hemoglobin A1C     Wt Readings from Last 3 Encounters:   05/04/19 226 lb (102.5 kg)   04/28/18 210 lb (95.3 kg)   06/19/17 209 lb (94.8 kg)     BP Readings from Last 3 Encounters:   05/04/19 132/70   04/28/18 132/80   06/19/17 120/78         Plan:      Return for Medicare Annual Wellness Visit in 1 year.  Orders Placed This Encounter   Procedures   ??? Lipid Panel     Standing Status:   Future     Standing Expiration Date:   05/03/2020     Order Specific Question:   Is Patient Fasting?/# of Hours  Answer:   8 hrs   ??? CBC With Auto Differential     Standing Status:   Future     Standing Expiration Date:   05/03/2020   ??? Comprehensive Metabolic Panel     Standing Status:   Future     Standing Expiration Date:   05/03/2020   ??? PSA Screening     Standing Status:   Future     Standing Expiration Date:   05/03/2020   ??? Hemoglobin A1C     Standing Status:   Future     Standing Expiration Date:   05/03/2020   ??? TSH     Standing Status:   Future     Standing Expiration Date:   05/03/2020   ??? T4     Standing Status:   Future     Standing Expiration Date:   05/03/2020   ??? Lipase     Standing Status:   Future     Standing Expiration Date:   05/03/2020     Orders Placed This Encounter   Medications   ??? omeprazole (PRILOSEC) 40 MG delayed release capsule     Sig: Take 1 capsule by mouth every morning (before breakfast)     Dispense:  30 capsule     Refill:  0   ??? nystatin (MYCOSTATIN) 100000 UNIT/GM ointment     Sig: Apply topically 2 times daily.     Dispense:  30 g     Refill:  1     Yeast dermatitis:  Trial nystatin  Call INB or worsening  Air dry area as much as able    ABd:  Trial PPI for next 2 weeks QD  Continue probiotics  LF diet, avoid spicy/greasy foods, alcohol, caffeine, and sit upright after meals for 2-3 hours, small portions of food throughout the day, avoid large meals.   Check labs for  other causes  Call INB or worsening  Will call with test results    Patient given educational materials - see patient instructions.  Discussed use,benefit, and side effects of prescribed medications.  All patient questions answered.Pt voiced understanding. Reviewed health maintenance.  Instructed to continue currentmedications, diet and exercise.    Electronically signed by Ardeth Perfect Smith,CNP on 05/04/2019 at 11:36 AM

## 2019-05-04 NOTE — Progress Notes (Signed)
Visit Information    Have you changed or started any medications since your last visit including any over-the-counter medicines, vitamins, or herbal medicines? no   Have you stopped taking any of your medications? Is so, why? -  no  Are you having any side effects from any of your medications? - no    Have you seen any other physician or provider since your last visit?  no   Have you had any other diagnostic tests since your last visit?  no   Have you been seen in the emergency room and/or had an admission in a hospital since we last saw you?  no   Have you had your routine dental cleaning in the past 6 months?  no     Do you have an active MyChart account? If no, what is the barrier?  no    Patient Care Team:  Tanna Furry, APRN - CNP as PCP - General (Nurse Practitioner)  Tanna Furry, APRN - CNP as PCP - [facility] Empaneled Provider  Alger Memos, MD    Medical History Review  Past Medical, Family, and Social History reviewed and  contribute to the patient presenting condition    Health Maintenance   Topic Date Due   ??? Shingles Vaccine (1 of 2) 12/28/1999   ??? Flu vaccine (1) 11/25/2018   ??? TSH testing  05/02/2019   ??? Colon cancer screen colonoscopy  07/26/2020   ??? Lipid screen  05/02/2023   ??? DTaP/Tdap/Td vaccine (3 - Td) 06/14/2026   ??? Pneumococcal 65+ years Vaccine  Completed   ??? AAA screen  Completed   ??? Hepatitis C screen  Addressed   ??? Hepatitis A vaccine  Aged Out   ??? Hepatitis B vaccine  Aged Out   ??? Hib vaccine  Aged Out   ??? Meningococcal (ACWY) vaccine  Aged Out

## 2019-05-04 NOTE — Telephone Encounter (Signed)
lvm for patient to call back

## 2019-05-04 NOTE — Telephone Encounter (Signed)
Pt returned call and expressed that he did not want to do anything in [place] and would like for the office to give him another call.

## 2019-05-05 NOTE — Telephone Encounter (Signed)
Patient advised. He will get in touch with insurance and give [place] a call back

## 2019-05-05 NOTE — Telephone Encounter (Signed)
lvm for patient to call back .

## 2019-05-07 ENCOUNTER — Inpatient Hospital Stay: Payer: BLUE CROSS/BLUE SHIELD | Primary: Family

## 2019-05-07 DIAGNOSIS — Z0000 Encounter for general adult medical examination without abnormal findings: Secondary | ICD-10-CM

## 2019-05-07 LAB — COMPREHENSIVE METABOLIC PANEL
ALT: 41 U/L (ref 5–41)
AST: 32 U/L (ref <40)
Albumin/Globulin Ratio: 1.9 (ref 1.0–2.5)
Albumin: 4.5 g/dL (ref 3.5–5.2)
Alkaline Phosphatase: 63 U/L (ref 40–129)
Anion Gap: 8 mmol/L — ABNORMAL LOW (ref 9–17)
BUN: 19 mg/dL (ref 8–23)
CO2: 28 mmol/L (ref 20–31)
Calcium: 9.3 mg/dL (ref 8.6–10.4)
Chloride: 103 mmol/L (ref 98–107)
Creatinine: 0.87 mg/dL (ref 0.70–1.20)
GFR African American: >60 mL/min (ref >60)
GFR Non-African American: >60 mL/min (ref >60)
Glucose: 102 mg/dL — ABNORMAL HIGH (ref 70–99)
Potassium: 4.5 mmol/L (ref 3.7–5.3)
Sodium: 139 mmol/L (ref 135–144)
Total Bilirubin: 0.38 mg/dL (ref 0.3–1.2)
Total Protein: 6.9 g/dL (ref 6.4–8.3)

## 2019-05-07 LAB — CBC WITH AUTO DIFFERENTIAL
Absolute Eos #: 0.18 10*3/uL (ref 0.00–0.44)
Absolute Immature Granulocyte: <0.03 10*3/uL (ref 0.00–0.30)
Absolute Lymph #: 1.59 10*3/uL (ref 1.10–3.70)
Absolute Mono #: 0.59 10*3/uL (ref 0.10–1.20)
Basophils Absolute: <0.03 10*3/uL (ref 0.00–0.20)
Basophils: 0 % (ref 0–2)
Eosinophils %: 3 % (ref 1–4)
Hematocrit: 45.9 % (ref 40.7–50.3)
Hemoglobin: 14.7 g/dL (ref 13.0–17.0)
Immature Granulocytes: 0 %
Lymphocytes: 28 % (ref 24–43)
MCH: 30.4 pg (ref 25.2–33.5)
MCHC: 32 g/dL (ref 28.4–34.8)
MCV: 94.8 fL (ref 82.6–102.9)
MPV: 10.5 fL (ref 8.1–13.5)
Monocytes: 10 % (ref 3–12)
NRBC Automated: 0 per 100 WBC
Platelets: 299 10*3/uL (ref 138–453)
RBC: 4.84 m/uL (ref 4.21–5.77)
RDW: 13.2 % (ref 11.8–14.4)
Seg Neutrophils: 59 % (ref 36–65)
Segs Absolute: 3.29 10*3/uL (ref 1.50–8.10)
WBC: 5.7 10*3/uL (ref 3.5–11.3)

## 2019-05-07 LAB — LIPID PANEL
Chol/HDL Ratio: 4.1 (ref <5)
Cholesterol: 219 mg/dL — ABNORMAL HIGH (ref <200)
HDL: 54 mg/dL (ref >40)
LDL Cholesterol: 147 mg/dL — ABNORMAL HIGH (ref 0–130)
Triglycerides: 90 mg/dL (ref <150)

## 2019-05-07 LAB — T4: T4, Total: 4.5 ug/dL (ref 4.5–10.9)

## 2019-05-07 LAB — PSA SCREENING: PSA: 1.55 ug/L (ref <4.1)

## 2019-05-07 LAB — TSH: TSH: 3.42 mIU/L (ref 0.30–5.00)

## 2019-05-07 LAB — HEMOGLOBIN A1C
Estimated Avg Glucose: 120 mg/dL
Hemoglobin A1C: 5.8 % (ref 4.0–6.0)

## 2019-05-07 LAB — LIPASE: Lipase: 55 U/L (ref 13–60)

## 2019-05-11 ENCOUNTER — Ambulatory Visit: Payer: Medicare Other

## 2019-05-21 ENCOUNTER — Telehealth

## 2019-05-21 NOTE — Telephone Encounter (Signed)
Patient came in the office stating that he needs a referral to Dr. Dora Sims for a colonoscopy but patient states that its too early, so he wants to know if you think that a colonoscopy is needed right now and if so, could he get it asap.

## 2019-05-21 NOTE — Telephone Encounter (Signed)
It is too early for screening one, but does he feel he needs one for current gi issues? If so could order as diagnostic?

## 2019-05-22 NOTE — Telephone Encounter (Signed)
Yes, when patient came in he stated that he knew it was early but needed Dustin Zimmerman to order one for urgent because of his curent GI issues.

## 2019-05-22 NOTE — Telephone Encounter (Signed)
Ok will order as diagnostic

## 2019-05-22 NOTE — Telephone Encounter (Signed)
Left detailed voicemail for patient

## 2019-05-25 ENCOUNTER — Ambulatory Visit: Payer: Medicare Other | Attending: Internal Medicine

## 2019-05-25 DIAGNOSIS — Z23 Encounter for immunization: Secondary | ICD-10-CM

## 2019-05-25 NOTE — Progress Notes (Signed)
   Covid-19 Vaccination Clinic  Name:  Malik [name]    MRN: [id number] DOB: 02-07-1951  05/25/2019  [name] was observed post Covid-19 immunization for 15 minutes without incidence. He was provided with Vaccine Information Sheet and instruction to access the V-Safe system.   [name] was instructed to call 911 with any severe reactions post vaccine: Marland Kitchen Difficulty breathing  . Swelling of your face and throat  . A fast heartbeat  . A bad rash all over your body  . Dizziness and weakness    Immunizations Administered    Name Date Dose VIS Date Route   Pfizer COVID-19 Vaccine 05/25/2019  3:45 PM 0.3 mL 03/06/2019 Intramuscular   Manufacturer: [employer], [employer]   Lot: [id number]   NDC: [address]-9

## 2019-06-02 ENCOUNTER — Encounter

## 2019-06-02 NOTE — Telephone Encounter (Signed)
lov 05/04/19

## 2019-06-03 MED ORDER — OMEPRAZOLE 40 MG PO CPDR
40 MG | ORAL_CAPSULE | ORAL | 3 refills | Status: AC
Start: 2019-06-03 — End: ?

## 2019-06-17 MED ORDER — LEVOTHYROXINE SODIUM 50 MCG PO TABS
50 MCG | ORAL_TABLET | ORAL | 3 refills | Status: DC
Start: 2019-06-17 — End: 2020-05-23

## 2019-06-17 NOTE — Telephone Encounter (Signed)
Lov 05/04/19

## 2019-08-04 ENCOUNTER — Telehealth

## 2019-08-04 NOTE — Telephone Encounter (Signed)
Please resend me message after June 1st and I can reorder, thanks

## 2019-08-04 NOTE — Telephone Encounter (Signed)
Patient came in stating that he needed a referral for Dr. Dora Sims but not until after June 1st of 2022 so that his insurance will cover it. He said he just wanted to get ahead of it. I know that referrals typically expire after a year so its probably too early to place the order, however; I just wanted to make note of the encounter.    Dr. Dora Sims  [address]  [place] [address]

## 2019-08-27 NOTE — Telephone Encounter (Signed)
Faxed.

## 2019-08-27 NOTE — Telephone Encounter (Signed)
Referral placed please fax

## 2019-09-09 ENCOUNTER — Other Ambulatory Visit (HOSPITAL_COMMUNITY): Payer: Self-pay | Admitting: Endocrinology

## 2019-09-09 DIAGNOSIS — I350 Nonrheumatic aortic (valve) stenosis: Secondary | ICD-10-CM

## 2019-10-02 ENCOUNTER — Ambulatory Visit (HOSPITAL_COMMUNITY): Payer: Medicare Other | Attending: Endocrinology

## 2019-10-02 ENCOUNTER — Other Ambulatory Visit: Payer: Self-pay

## 2019-10-02 DIAGNOSIS — I350 Nonrheumatic aortic (valve) stenosis: Secondary | ICD-10-CM | POA: Diagnosis present

## 2019-10-20 ENCOUNTER — Other Ambulatory Visit: Payer: Self-pay | Admitting: Urology

## 2019-10-21 NOTE — Progress Notes (Signed)
DUE TO COVID-19 ONLY ONE VISITOR IS ALLOWED TO COME WITH YOU AND STAY IN THE WAITING ROOM ONLY DURING PRE OP AND PROCEDURE DAY OF SURGERY. THE 1 VISITOR MAY VISIT WITH YOU AFTER SURGERY IN YOUR PRIVATE ROOM DURING VISITING HOURS ONLY!  YOU NEED TO HAVE A COVID 19 TEST ON_______ @_______ , THIS TEST MUST BE DONE BEFORE SURGERY, COME  [address],  [place] , .  [facility]) , COVID TESTING SITE [address] [place] [facility], IT IS ON THE RIGHT GOING OUT [address] APPROXIT[address]TELY 2 MINUTES PAST [employer] ON THE RIGHT. ONCE YOUR COVID TEST IS COMPLETED,  PLEASE BEGIN THE QUARANTINE INSTRUCTIONS AS OUTLINED IN YOUR HANDOUT.                Malik [name]  10/21/2019   Your procedure is scheduled on:  11/03/19   Report to [facility] Main  Entrance   Report to admitting at   0700 AM     Call this number if you have problems the morning of surgery [phone]    Remember: Do not eat food or drink liquids :After Midnight. BRUSH YOUR TEETH MORNING OF SURGERY AND RINSE YOUR MOUTH OUT, NO CHEWING GUM CANDY OR MINTS.     Take these medicines the morning of surgery with A SIP OF WATER:  None                                 You may not have any metal on your body including hair pins and              piercings  Do not wear jewelry, , lotions, powders or perfumes, deodorant                         Men may shave face and neck.   Do not bring valuables to the hospital. [place] IS NOT             RESPONSIBLE   FOR VALUABLES.  Contacts, dentures or bridgework may not be worn into surgery.  Leave suitcase in the car. After surgery it may be brought to your room.     Patients discharged the day of surgery will not be allowed to drive home. IF YOU ARE HAVING SURGERY AND GOING HOME THE SAME DAY, YOU MUST HAVE AN ADULT TO DRIVE YOU HOME AND BE WITH YOU FOR 24 HOURS. YOU MAY GO HOME BY TAXI OR UBER OR ORTHERWISE, BUT AN ADULT MUST  ACCOMPANY YOU HOME AND STAY WITH YOU FOR 24 HOURS.  Name and phone number of your driver:               Please read over the following fact sheets you were given: _____________________________________________________________________             [facility] - Preparing for Surgery Before surgery, you can play an important role.  Because skin is not sterile, your skin needs to be as free of germs as possible.  You can reduce the number of germs on your skin by washing with CHG (chlorahexidine gluconate) soap before surgery.  CHG is an antiseptic cleaner which kills germs and bonds with the skin to continue killing germs even after washing. Please DO NOT use if you have an allergy to CHG or antibacterial soaps.  If your skin becomes reddened/irritated stop using  the CHG and inform your nurse when you arrive at Short Stay. Do not shave (including legs and underarms) for at least 48 hours prior to the first CHG shower.  You may shave your face/neck. Please follow these instructions carefully:  1.  Shower with CHG Soap the night before surgery and the  morning of Surgery.  2.  If you choose to wash your hair, wash your hair first as usual with your  normal  shampoo.  3.  After you shampoo, rinse your hair and body thoroughly to remove the  shampoo.                           4.  Use CHG as you would any other liquid soap.  You can apply chg directly  to the skin and wash                       Gently with a scrungie or clean washcloth.  5.  Apply the CHG Soap to your body ONLY FROM THE NECK DOWN.   Do not use on face/ open                           Wound or open sores. Avoid contact with eyes, ears mouth and genitals (private parts).                       Wash face,  Genitals (private parts) with your normal soap.             6.  Wash thoroughly, paying special attention to the area where your surgery  will be performed.  7.  Thoroughly rinse your body with warm water from the neck down.  8.  DO NOT  shower/wash with your normal soap after using and rinsing off  the CHG Soap.                9.  Pat yourself dry with a clean towel.            10.  Wear clean pajamas.            11.  Place clean sheets on your bed the night of your first shower and do not  sleep with pets. Day of Surgery : Do not apply any lotions/deodorants the morning of surgery.  Please wear clean clothes to the hospital/surgery center.  FAILURE TO FOLLOW THESE INSTRUCTIONS MAY RESULT IN THE CANCELLATION OF YOUR SURGERY PATIENT SIGNATURE_________________________________  NURSE SIGNATURE__________________________________  ________________________________________________________________________

## 2019-10-30 ENCOUNTER — Other Ambulatory Visit (HOSPITAL_COMMUNITY)
Admission: RE | Admit: 2019-10-30 | Discharge: 2019-10-30 | Disposition: A | Discharge status: Home / Self Care | Payer: Medicare Other | Source: Ambulatory Visit | Attending: Urology | Admitting: Urology

## 2019-10-30 DIAGNOSIS — Z01812 Encounter for preprocedural laboratory examination: Secondary | ICD-10-CM | POA: Insufficient documentation

## 2019-10-30 DIAGNOSIS — Z20822 Contact with and (suspected) exposure to covid-19: Secondary | ICD-10-CM | POA: Diagnosis not present

## 2019-10-30 LAB — SARS CORONAVIRUS 2 (TAT 6-24 HRS): SARS Coronavirus 2: NEGATIVE

## 2019-11-02 ENCOUNTER — Other Ambulatory Visit: Payer: Self-pay

## 2019-11-02 ENCOUNTER — Encounter (HOSPITAL_COMMUNITY): Payer: Self-pay

## 2019-11-02 ENCOUNTER — Encounter (HOSPITAL_COMMUNITY)
Admission: RE | Admit: 2019-11-02 | Discharge: 2019-11-02 | Disposition: A | Discharge status: Home / Self Care | Payer: Medicare Other | Source: Ambulatory Visit | Attending: Urology | Admitting: Urology

## 2019-11-02 DIAGNOSIS — Z01818 Encounter for other preprocedural examination: Secondary | ICD-10-CM | POA: Diagnosis present

## 2019-11-02 HISTORY — DX: Gastro-esophageal reflux disease without esophagitis: K21.9

## 2019-11-02 HISTORY — DX: Personal history of urinary calculi: Z87.442

## 2019-11-02 HISTORY — DX: Cardiac murmur, unspecified: R01.1

## 2019-11-02 LAB — CBC
HCT: 45.7 % (ref 39.0–52.0)
Hemoglobin: 15.4 g/dL (ref 13.0–17.0)
MCH: 30.9 pg (ref 26.0–34.0)
MCHC: 33.7 g/dL (ref 30.0–36.0)
MCV: 91.6 fL (ref 80.0–100.0)
Platelets: 192 10*3/uL (ref 150–400)
RBC: 4.99 MIL/uL (ref 4.22–5.81)
RDW: 13.2 % (ref 11.5–15.5)
WBC: 6.3 10*3/uL (ref 4.0–10.5)
nRBC: 0 % (ref 0.0–0.2)

## 2019-11-02 LAB — BASIC METABOLIC PANEL
Anion gap: 9 (ref 5–15)
BUN: 16 mg/dL (ref 8–23)
CO2: 28 mmol/L (ref 22–32)
Calcium: 9.5 mg/dL (ref 8.9–10.3)
Chloride: 103 mmol/L (ref 98–111)
Creatinine, Ser: 0.92 mg/dL (ref 0.61–1.24)
GFR calc Af Amer: >60 mL/min (ref >60)
GFR calc non Af Amer: >60 mL/min (ref >60)
Glucose, Bld: 108 mg/dL — ABNORMAL HIGH (ref 70–99)
Potassium: 4.3 mmol/L (ref 3.5–5.1)
Sodium: 140 mmol/L (ref 135–145)

## 2019-11-02 NOTE — H&P (Signed)
I have trouble rolling my foreskin back.     Malik [name] is a 69 yo WM who is sent in consultation by Dr. Evlyn Kanner for phimosis. He has had progressive issues over the past year and has failed topical therapy. He can't get it back to clean now. He has no voiding complaints. His IPSS is 2.     ALLERGIES: None   MEDICATIONS: Aspirin  Lisinopril  Metoprolol Succinate  Simvastatin     GU PSH: None   NON-GU PSH: Appendectomy     GU PMH: None   NON-GU PMH: Cardiac murmur, unspecified Hypercholesterolemia Hypertension    FAMILY HISTORY: Kidney Stones - Brother   SOCIAL HISTORY: Marital Status: Divorced Preferred Language: English Current Smoking Status: Patient does not smoke anymore. Has not smoked since 09/24/1978. Smoked for 12 years.   Tobacco Use Assessment Completed: Used Tobacco in last 30 days? Has never drank.  Drinks 2 caffeinated drinks per day. 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    REVIEW OF SYSTEMS:    GU Review Male:   Patient denies frequent urination, hard to postpone urination, burning/ pain with urination, get up at night to urinate, leakage of urine, stream starts and stops, trouble starting your stream, have to strain to urinate , erection problems, and penile pain.  Gastrointestinal (Upper):   Patient denies nausea, vomiting, and indigestion/ heartburn.  Gastrointestinal (Lower):   Patient denies diarrhea and constipation.  Constitutional:   20lb with diet. Patient reports weight loss. Patient denies fever, night sweats, and fatigue.  Skin:   Patient denies skin rash/ lesion and itching.  Eyes:   Patient denies blurred vision and double vision.  Ears/ Nose/ Throat:   Patient denies sore throat and sinus problems.  Hematologic/Lymphatic:   Patient reports easy bruising. Patient denies swollen glands.  Cardiovascular:   Patient denies leg swelling and chest pains.  Respiratory:   Patient denies cough and shortness of breath.  Endocrine:   Patient denies  excessive thirst.  Musculoskeletal:   Patient denies back pain and joint pain.  Neurological:   Patient denies headaches and dizziness.  Psychologic:   Patient denies depression and anxiety.   VITAL SIGNS:      10/19/2019 02:12 PM  Weight 310 lb / 140.61 kg  Height 72 in / 182.88 cm  BP 123/65 mmHg  Heart Rate 77 /min  Temperature 97.3 F / 36.2 C  BMI 42.0 kg/m   GU PHYSICAL EXAMINATION:    Anus and Perineum: No hemorrhoids. No anal stenosis. No rectal fissure, no anal fissure. No edema, no dimple, no perineal tenderness, no anal tenderness.  Scrotum: No lesions. No edema. No cysts. No warts.  Epididymides: Right: no spermatocele, no masses, no cysts, no tenderness, no induration, no enlargement. Left: no spermatocele, no masses, no cysts, no tenderness, no induration, no enlargement.  Testes: No tenderness, no swelling, no enlargement left testes. No tenderness, no swelling, no enlargement right testes. Normal location left testes. Normal location right testes. No mass, no cyst, no varicocele, no hydrocele left testes. No mass, no cyst, no varicocele, no hydrocele right testes.  Urethral Meatus: Normal size. No lesion, no wart, no discharge, no polyp. Normal location.  Penis: Penis uncircumcised, phimosis., severe but the meatus is still visible and there is mild balanitis., no meatal stenosis.   Prostate: Prostate 1 1/2+ size. Left lobe normal consistency, right lobe normal consistency. Symmetrical lobes. No prostate nodule. Left lobe no tenderness, right lobe no tenderness.   Seminal Vesicles: Nonpalpable.  Sphincter Tone:  Normal sphincter. No rectal tenderness. No rectal mass.    MULTI-SYSTEM PHYSICAL EXAMINATION:    Constitutional: Obese. No physical deformities. Normally developed. Good grooming.   Neck: Neck symmetrical, not swollen. Normal tracheal position.  Respiratory: No labored breathing, no use of accessory muscles.   Cardiovascular: Normal temperature, normal extremity  pulses, no swelling, no varicosities.  Lymphatic: No enlargement of neck, axillae, groin.  Skin: No paleness, no jaundice, no cyanosis. No lesion, no ulcer, no rash.  Neurologic / Psychiatric: Oriented to time, oriented to place, oriented to person. No depression, no anxiety, no agitation.  Gastrointestinal: No mass, no tenderness, no rigidity, non obese abdomen.  Eyes: Normal conjunctivae. Normal eyelids.  Ears, Nose, Mouth, and Throat: Left ear no scars, no lesions, no masses. Right ear no scars, no lesions, no masses. Nose no scars, no lesions, no masses. Normal hearing. Normal lips.  Musculoskeletal: Normal gait and station of head and neck.     Complexity of Data:  Lab Test Review:   PSA, BMP, CBC with Diff, Hgb-A1c  Records Review:   AUA Symptom Score, Previous Doctor Records  Urine Test Review:   Urinalysis  Notes:                     PSA was 0.546. Hgb A1c is 6.1. Glucose was 140.    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    PLAN:           Schedule Return Visit/Planned Activity: Next Available Appointment - Schedule Surgery  Procedure: Unspecified Date - Non-Newborn Circumcision - [phone] Notes: Next available.

## 2019-11-02 NOTE — Progress Notes (Addendum)
Anesthesia Review:  PCP: DR Laurene Footman  Requested most recent office visit note from DR Saint Martin on 11/02/19  Cardiologist : DR Eden Emms - LOV - 2016 in epic   Chest x-ray : EKG :11/02/19 - final pending  Echo :10/02/19- shows moderate aortic valve stenosis  Stress test: Cardiac Cath :  Activity level: can do a flight of steps without difficulty  Sleep Study/ CPAP : Fasting Blood Sugar :      / Checks Blood Sugar -- times a day:   Blood Thinner/ Instructions /Last Dose: ASA / Instructions/ Last Dose :

## 2019-11-02 NOTE — Progress Notes (Signed)
Anesthesia Chart Review   Case: [id number] Date/Time: 11/03/19 0845   Procedure: CIRCUMCISION ADULT (N/A )   Anesthesia type: General   Pre-op diagnosis: PHIMOSIS   Location: [facility] PROCEDURE ROOM / [facility]   Surgeons: Bjorn Pippin, MD      DISCUSSION:69 y.o. former smoker (quit 08/19/78) with h/o HTN, HLD, GERD, moderate AS (Mean gradient 35.0 mmHg, VTI 1.23cm2 on Echo 10/02/2019), phimosis scheduled for above procedure 11/03/2019 with Dr. Bjorn Pippin.   Pt last seen by PCP 08/20/2019, echo repeated at this time with progression of AS.  On Echo 06/17/14 mean gradient 17 mmHg, on most recent mean gradient 35.0 mmHg.  Pt asymptomatic.  Reports to PAT nurse he can climb a flight of stairs without difficulty.   Discussed with Dr. Renold Don.  Anticipate pt can proceed with planned procedure barring acute status change.    VS: BP (!) 113/59   Pulse (!) 57   Temp 36.6 C (Oral)   Resp 18   Ht 6' (1.829 m)   Wt 135.2 kg   SpO2 97%   BMI 40.43 kg/m   PROVIDERS: Adrian Prince, MD is PCP    LABS: Labs reviewed: Acceptable for surgery. (all labs ordered are listed, but only abnormal results are displayed)  Labs Reviewed  BASIC METABOLIC PANEL - Abnormal; Notable for the following components:      Result Value   Glucose, Bld 108 (*)    All other components within normal limits  CBC     IMAGES:   EKG: 11/02/2019 Rate 59 bpm  Sinus bradycardia Left axis deviation  Cannot rule out anterior infarct, age undetermined   CV: Echo 10/02/2019 IMPRESSIONS    1. Left ventricular ejection fraction, by estimation, is 65 to 70%. The  left ventricle has normal function. The left ventricle has no regional  wall motion abnormalities. There is mild left ventricular hypertrophy.  Left ventricular diastolic parameters  are consistent with Grade I diastolic dysfunction (impaired relaxation).  2. Right ventricular systolic function is normal. The right ventricular  size is normal. There is normal  pulmonary artery systolic pressure.  3. The mitral valve is abnormal. Trivial mitral valve regurgitation.  4. The aortic valve is tricuspid. Aortic valve regurgitation is trivial.  Moderate aortic valve stenosis. Aortic valve area, by VTI measures 1.23  cm. Aortic valve mean gradient measures 35.0 mmHg. Aortic valve Vmax  measures 3.77 m/s.  5. Aortic dilatation noted. 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  6. The inferior vena cava is normal in size with greater than 50%  respiratory variability, suggesting right atrial pressure of 3 mmHg.   Comparison(s): Changes from prior study are noted. 06/17/14: LVEF 55-60%,  mild AS - mean gradient 17 mmHg, dilated aorta to 4.0 cm.  Past Medical History:  Diagnosis Date  . Aortic stenosis   . Arthritis   . GERD (gastroesophageal reflux disease)   . Heart murmur   . History of kidney stones   . Hyperglycemia   . Hyperlipidemia   . Hypertension   . Obesity     Past Surgical History:  Procedure Laterality Date  . APPENDECTOMY      MEDICATIONS: . aspirin 325 MG EC tablet  . lisinopril-hydrochlorothiazide (PRINZIDE,ZESTORETIC) 20-25 MG tablet  . metoprolol succinate (TOPROL-XL) 100 MG 24 hr tablet  . simvastatin (ZOCOR) 80 MG tablet   No current facility-administered medications for this encounter.    Jodell Cipro, PA-C [facility] Pre-Surgical Testing [phone]

## 2019-11-02 NOTE — Anesthesia Preprocedure Evaluation (Addendum)
Anesthesia Evaluation  Patient identified by MRN, date of birth, ID band Patient awake    Reviewed: Allergy & Precautions, NPO status , Patient's Chart, lab work & pertinent test results  Airway Mallampati: I  TM Distance: >3 FB Neck ROM: Full    Dental   Pulmonary former smoker,    Pulmonary exam normal        Cardiovascular hypertension, Pt. on medications Normal cardiovascular exam+ Valvular Problems/Murmurs AS   ECHO 7/21 IMPRESSIONS   1. Left ventricular ejection fraction, by estimation, is 65 to 70%. The left ventricle has normal function. The left ventricle has no regional wall motion abnormalities. There is mild left ventricular hypertrophy. Left ventricular diastolic parameters  are consistent with Grade I diastolic dysfunction (impaired relaxation). 2. Right ventricular systolic function is normal. The right ventricular size is normal. There is normal pulmonary artery systolic pressure. 3. The mitral valve is abnormal. Trivial mitral valve regurgitation. 4. The aortic valve is tricuspid. Aortic valve regurgitation is trivial. Moderate aortic valve stenosis. Aortic valve area, by VTI measures 1.23 cm. Aortic valve mean gradient measures 35.0 mmHg. Aortic valve Vmax measures 3.77 m/s. 5. Aortic dilatation noted. There is mild to moderate dilatation of the ascending aorta measuring 43 mm. 6. The inferior vena cava is normal in size with greater than 50% respiratory variability, suggesting right atrial pressure of 3 mmHg.    Neuro/Psych    GI/Hepatic GERD  Medicated and Controlled,  Endo/Other    Renal/GU      Musculoskeletal   Abdominal   Peds  Hematology   Anesthesia Other Findings   Reproductive/Obstetrics                            Anesthesia Physical Anesthesia Plan  ASA: III  Anesthesia Plan: General   Post-op Pain Management:    Induction: Intravenous  PONV Risk  Score and Plan: 2 and Ondansetron and Treatment may vary due to age or medical condition  Airway Management Planned: LMA  Additional Equipment:   Intra-op Plan:   Post-operative Plan: Extubation in OR  Informed Consent: I have reviewed the patients History and Physical, chart, labs and discussed the procedure including the risks, benefits and alternatives for the proposed anesthesia with the patient or authorized representative who has indicated his/her understanding and acceptance.       Plan Discussed with: CRNA and Surgeon  Anesthesia Plan Comments: (See PAT note 11/02/2019, Jodell Cipro, PA-C)       Anesthesia Quick Evaluation

## 2019-11-03 ENCOUNTER — Ambulatory Visit (HOSPITAL_COMMUNITY): Payer: Medicare Other | Admitting: Certified Registered Nurse Anesthetist

## 2019-11-03 ENCOUNTER — Ambulatory Visit (HOSPITAL_COMMUNITY): Payer: Medicare Other | Admitting: Physician Assistant

## 2019-11-03 ENCOUNTER — Encounter (HOSPITAL_COMMUNITY)
Admission: RE | Disposition: A | Discharge status: Home / Self Care | Payer: Self-pay | Source: Home / Self Care | Attending: Urology

## 2019-11-03 ENCOUNTER — Ambulatory Visit (HOSPITAL_COMMUNITY)
Admission: RE | Admit: 2019-11-03 | Discharge: 2019-11-03 | Disposition: A | Discharge status: Home / Self Care | Payer: Medicare Other | Attending: Urology | Admitting: Urology

## 2019-11-03 ENCOUNTER — Encounter (HOSPITAL_COMMUNITY): Payer: Self-pay | Admitting: Urology

## 2019-11-03 DIAGNOSIS — I10 Essential (primary) hypertension: Secondary | ICD-10-CM | POA: Diagnosis not present

## 2019-11-03 DIAGNOSIS — Z87891 Personal history of nicotine dependence: Secondary | ICD-10-CM | POA: Diagnosis not present

## 2019-11-03 DIAGNOSIS — Z79899 Other long term (current) drug therapy: Secondary | ICD-10-CM | POA: Diagnosis not present

## 2019-11-03 DIAGNOSIS — E7800 Pure hypercholesterolemia, unspecified: Secondary | ICD-10-CM | POA: Diagnosis not present

## 2019-11-03 DIAGNOSIS — N471 Phimosis: Secondary | ICD-10-CM | POA: Diagnosis not present

## 2019-11-03 DIAGNOSIS — Z7982 Long term (current) use of aspirin: Secondary | ICD-10-CM | POA: Insufficient documentation

## 2019-11-03 HISTORY — PX: CIRCUMCISION: SHX1350

## 2019-11-03 LAB — GLUCOSE, CAPILLARY: Glucose-Capillary: 117 mg/dL — ABNORMAL HIGH (ref 70–99)

## 2019-11-03 SURGERY — CIRCUMCISION, ADULT
Anesthesia: General

## 2019-11-03 MED ORDER — ORAL CARE MOUTH RINSE: 15.0000 mL | Freq: Once | OROMUCOSAL | Status: AC

## 2019-11-03 MED ORDER — LIDOCAINE HCL (PF) 1 % IJ SOLN
INTRAMUSCULAR | Status: AC
  Filled 2019-11-03: qty 30

## 2019-11-03 MED ORDER — CEFAZOLIN SODIUM-DEXTROSE 2-4 GM/100ML-% IV SOLN: 2.0000 g | INTRAVENOUS | Status: DC

## 2019-11-03 MED ORDER — ONDANSETRON HCL 4 MG/2ML IJ SOLN
INTRAMUSCULAR | Status: AC
  Filled 2019-11-03: qty 2

## 2019-11-03 MED ORDER — CHLORHEXIDINE GLUCONATE 0.12 % MT SOLN
15.0000 mL | Freq: Once | OROMUCOSAL | Status: AC
  Administered 2019-11-03: 15 mL via OROMUCOSAL

## 2019-11-03 MED ORDER — BUPIVACAINE HCL 0.25 % IJ SOLN
INTRAMUSCULAR | Status: DC | PRN
  Administered 2019-11-03: 2 mL

## 2019-11-03 MED ORDER — CEFAZOLIN SODIUM-DEXTROSE 2-4 GM/100ML-% IV SOLN
INTRAVENOUS | Status: AC
  Filled 2019-11-03: qty 100

## 2019-11-03 MED ORDER — PROPOFOL 10 MG/ML IV BOLUS
INTRAVENOUS | Status: DC | PRN
  Administered 2019-11-03: 150 mg via INTRAVENOUS

## 2019-11-03 MED ORDER — DEXTROSE 5 % IV SOLN
3.0000 g | INTRAVENOUS | Status: AC
  Administered 2019-11-03: 3 g via INTRAVENOUS
  Filled 2019-11-03: qty 3

## 2019-11-03 MED ORDER — BACITRACIN-NEOMYCIN-POLYMYXIN OINTMENT TUBE
TOPICAL_OINTMENT | CUTANEOUS | Status: AC
  Filled 2019-11-03: qty 14.17

## 2019-11-03 MED ORDER — MEPERIDINE HCL 50 MG/ML IJ SOLN: 6.2500 mg | INTRAMUSCULAR | Status: DC | PRN

## 2019-11-03 MED ORDER — HYDROMORPHONE HCL 1 MG/ML IJ SOLN: 0.2500 mg | INTRAMUSCULAR | Status: DC | PRN

## 2019-11-03 MED ORDER — LACTATED RINGERS IV SOLN: INTRAVENOUS | Status: DC

## 2019-11-03 MED ORDER — ONDANSETRON HCL 4 MG/2ML IJ SOLN
INTRAMUSCULAR | Status: DC | PRN
  Administered 2019-11-03: 4 mg via INTRAVENOUS

## 2019-11-03 MED ORDER — FENTANYL CITRATE (PF) 100 MCG/2ML IJ SOLN
INTRAMUSCULAR | Status: DC | PRN
  Administered 2019-11-03: 100 ug via INTRAVENOUS

## 2019-11-03 MED ORDER — LIDOCAINE 2% (20 MG/ML) 5 ML SYRINGE
INTRAMUSCULAR | Status: DC | PRN
  Administered 2019-11-03: 100 mg via INTRAVENOUS

## 2019-11-03 MED ORDER — SODIUM CHLORIDE 0.9% FLUSH: 3.0000 mL | Freq: Two times a day (BID) | INTRAVENOUS | Status: DC

## 2019-11-03 MED ORDER — BUPIVACAINE HCL 0.25 % IJ SOLN
INTRAMUSCULAR | Status: AC
  Filled 2019-11-03: qty 1

## 2019-11-03 MED ORDER — LIDOCAINE HCL 1 % IJ SOLN
INTRAMUSCULAR | Status: DC | PRN
  Administered 2019-11-03: 2 mL

## 2019-11-03 MED ORDER — PHENYLEPHRINE 40 MCG/ML (10ML) SYRINGE FOR IV PUSH (FOR BLOOD PRESSURE SUPPORT)
PREFILLED_SYRINGE | INTRAVENOUS | Status: DC | PRN
  Administered 2019-11-03 (×3): 80 ug via INTRAVENOUS

## 2019-11-03 MED ORDER — PROPOFOL 10 MG/ML IV BOLUS
INTRAVENOUS | Status: AC
  Filled 2019-11-03: qty 20

## 2019-11-03 MED ORDER — LIDOCAINE 2% (20 MG/ML) 5 ML SYRINGE
INTRAMUSCULAR | Status: AC
  Filled 2019-11-03: qty 5

## 2019-11-03 MED ORDER — ONDANSETRON HCL 4 MG/2ML IJ SOLN: 4.0000 mg | Freq: Once | INTRAMUSCULAR | Status: DC | PRN

## 2019-11-03 MED ORDER — 0.9 % SODIUM CHLORIDE (POUR BTL) OPTIME
TOPICAL | Status: DC | PRN
  Administered 2019-11-03: 1000 mL

## 2019-11-03 MED ORDER — HYDROCODONE-ACETAMINOPHEN 5-325 MG PO TABS: 1.0000 | ORAL_TABLET | ORAL | 0 refills | Status: AC | PRN

## 2019-11-03 MED ORDER — FENTANYL CITRATE (PF) 100 MCG/2ML IJ SOLN
INTRAMUSCULAR | Status: AC
  Filled 2019-11-03: qty 2

## 2019-11-03 SURGICAL SUPPLY — 28 items
BLADE SURG 15 STRL LF DISP TIS (BLADE) ×1 IMPLANT
BLADE SURG 15 STRL SS (BLADE) ×3
BNDG COHESIVE 2X5 TAN STRL LF (GAUZE/BANDAGES/DRESSINGS) ×3 IMPLANT
BNDG CONFORM 2 STRL LF (GAUZE/BANDAGES/DRESSINGS) ×3 IMPLANT
COVER BACK TABLE 60X90IN (DRAPES) ×3 IMPLANT
COVER SURGICAL LIGHT HANDLE (MISCELLANEOUS) ×3 IMPLANT
COVER WAND RF STERILE (DRAPES) IMPLANT
DECANTER SPIKE VIAL GLASS SM (MISCELLANEOUS) IMPLANT
DRAPE LAPAROTOMY T 98X78 PEDS (DRAPES) ×3 IMPLANT
DRSG TEGADERM 2-3/8X2-3/4 SM (GAUZE/BANDAGES/DRESSINGS) ×2 IMPLANT
ELECT NEEDLE TIP 2.8 STRL (NEEDLE) ×3 IMPLANT
ELECT PENCIL ROCKER SW 15FT (MISCELLANEOUS) ×3 IMPLANT
ELECT REM PT RETURN 15FT ADLT (MISCELLANEOUS) ×3 IMPLANT
GAUZE 4X4 16PLY RFD (DISPOSABLE) ×3 IMPLANT
GAUZE SPONGE 4X4 12PLY STRL (GAUZE/BANDAGES/DRESSINGS) ×3 IMPLANT
GAUZE XEROFORM 1X8 LF (GAUZE/BANDAGES/DRESSINGS) ×4 IMPLANT
GLOVE SURG SS PI 8.0 STRL IVOR (GLOVE) IMPLANT
GOWN STRL REUS W/TWL XL LVL3 (GOWN DISPOSABLE) ×3 IMPLANT
KIT BASIN OR (CUSTOM PROCEDURE TRAY) ×3 IMPLANT
KIT TURNOVER KIT A (KITS) IMPLANT
NDL HYPO 25X1 1.5 SAFETY (NEEDLE) IMPLANT
NEEDLE HYPO 25X1 1.5 SAFETY (NEEDLE) ×6 IMPLANT
NS IRRIG 1000ML POUR BTL (IV SOLUTION) IMPLANT
PACK BASIC VI WITH GOWN DISP (CUSTOM PROCEDURE TRAY) ×3 IMPLANT
SUT CHROMIC 4 0 PS 2 18 (SUTURE) ×8 IMPLANT
SYR CONTROL 10ML LL (SYRINGE) IMPLANT
TOWEL OR 17X26 10 PK STRL BLUE (TOWEL DISPOSABLE) ×3 IMPLANT
WATER STERILE IRR 1000ML POUR (IV SOLUTION) IMPLANT

## 2019-11-03 NOTE — Transfer of Care (Signed)
Immediate Anesthesia Transfer of Care Note  Patient: Malik [name]  Procedure(s) Performed: CIRCUMCISION ADULT (N/A )  Patient Location: PACU  Anesthesia Type:General  Level of Consciousness: awake, alert  and oriented  Airway & Oxygen Therapy: Patient Spontanous Breathing and Patient connected to face mask oxygen  Post-op Assessment: Report given to RN and Post -op Vital signs reviewed and stable  Post vital signs: Reviewed and stable  Last Vitals:  Vitals Value Taken Time  BP 111/79 11/03/19 1015  Temp    Pulse 65 11/03/19 1016  Resp 18 11/03/19 1016  SpO2 98 % 11/03/19 1016  Vitals shown include unvalidated device data.  Last Pain:  Vitals:   11/03/19 0707  TempSrc: Oral         Complications: No complications documented.

## 2019-11-03 NOTE — Anesthesia Postprocedure Evaluation (Signed)
Anesthesia Post Note  Patient: ANTONIOUS OMAHONEY  Procedure(s) Performed: CIRCUMCISION ADULT (N/A )     Patient location during evaluation: PACU Anesthesia Type: General Level of consciousness: awake and alert Pain management: pain level controlled Vital Signs Assessment: post-procedure vital signs reviewed and stable Respiratory status: spontaneous breathing, nonlabored ventilation, respiratory function stable and patient connected to nasal cannula oxygen Cardiovascular status: blood pressure returned to baseline and stable Postop Assessment: no apparent nausea or vomiting Anesthetic complications: no   No complications documented.  Last Vitals:  Vitals:   11/03/19 1045 11/03/19 1100  BP: 100/74 113/75  Pulse: (!) 56 (!) 57  Resp: 16 18  Temp:  36.9 C  SpO2: 94% 98%    Last Pain:  Vitals:   11/03/19 1100  TempSrc:   PainSc: 0-No pain                 Haven Pylant DAVID

## 2019-11-03 NOTE — Op Note (Signed)
  Preoperative diagnosis: 1. Phimosis  Postoperative diagnosis:  1. Phimosis  Procedure:  1. Circumcision  Surgeon: Bjorn Pippin. M.D.  Anesthesia: general  Complications: None  EBL: 74ml  Specimens: 1. Foreskin   Disposition of specimens: discarded  Indications:  This patient is to undergo circumcision for symptomatic phimosis.  Procedure:   The patient was placed in the supine position and anesthesia was induced.  The genitalia was prepped with betadine solution and he was draped in the usual sterile fashion.   A penile block was performed with 43ml of 50/50 2% lidocaine 0.25% marcaine.  The redundant foreskin was excised using the dorsal and ventral slit  technique and hemostasis was achieved with the Bovie.  The skin edges were reapproximated with a running 4-0 chromic that was tied at the quadrants.  The wound was cleaned and dried.  A dressing of Xeroform, Kling and Coban was applied.  The anesthetic was reversed and he was taken to the recovery room in stable condition.  There were no complications.

## 2019-11-03 NOTE — Anesthesia Procedure Notes (Signed)
Procedure Name: LMA Insertion Date/Time: 11/03/2019 9:30 AM Performed by: [place], Quintus Premo C, CRNA Pre-anesthesia Checklist: Patient identified, Emergency Drugs available, Suction available and Patient being monitored Patient Re-evaluated:Patient Re-evaluated prior to induction Oxygen Delivery Method: Circle system utilized Preoxygenation: Pre-oxygenation with 100% oxygen Induction Type: IV induction Ventilation: Mask ventilation without difficulty LMA: LMA inserted LMA Size: 4.0 Number of attempts: 1 Airway Equipment and Method: Bite block Placement Confirmation: positive ETCO2 Tube secured with: Tape Dental Injury: Teeth and Oropharynx as per pre-operative assessment

## 2019-11-03 NOTE — Discharge Instructions (Signed)
Circumcision, Adult, Care After This sheet gives you information about how to care for yourself after your procedure. Your doctor may also give you more specific instructions. If you have problems or questions, contact your doctor. Follow these instructions at home: Cut care   Follow instructions from your doctor about how to take care of your cut from surgery (incision). Make sure you: ? Wash your hands with soap and water before you change your bandage (dressing). If you cannot use soap and water, use hand sanitizer. ? Change your bandage as told by your doctor. ? Leave stitches (sutures), skin glue, or skin tape (adhesive) strips in place. They may need to stay in place for 2 weeks or longer. If tape strips get loose and curl up, you may trim the loose edges. Do not remove tape strips completely unless your doctor says it is okay  Check your cut area every day for signs of infection. Check for: ? More redness, swelling, or pain. ? More fluid or blood. ? Warmth. ? Pus or a bad smell. Bathing  Do not take baths, swim, or use a hot tub until your doctor says it is okay. Ask your doctor if you can take showers. You may only be allowed to take sponge baths for bathing.  Do not get your cut area wet during the 24 hours after the procedure, or as long as told by your doctor.  When you can shower, do not rub the cut area. Gently pat it dry. General instructions  Avoid heavy lifting, contact sports, biking, or swimming until your doctor says it is okay.  Do not have sex until your doctor says it is okay.  Take or apply over-the-counter and prescription medicines only as told by your doctor.  Keep all follow-up visits as told by your doctor. This is important. Contact a doctor if:  Medicine does not help your pain.  You have more redness, swelling, or pain around your cut.  You have more fluid or blood coming from your cut.  Your cut feels warm to the touch.  You have pus or a bad  smell coming from your cut.  You have a fever. Get help right away if:  You cannot pee (urinate).  It hurts to pee.  Your pain is not helped by medicines.  There is redness, swelling, and soreness that spreads up the shaft of your penis, your thighs, or your lower belly (abdomen).  You have bleeding that does not stop when you press on it. Summary  Follow instructions from your doctor about how to take care of your cut from surgery (incision).  Check your cut area every day for signs of infection.  Do not have sex until your doctor says it is okay.  YOu may remove the dressing in the morning and if it falls off, leave it off.   This information is not intended to replace advice given to you by your health care provider. Make sure you discuss any questions you have with your health care provider. Document Revised: 02/22/2017 Document Reviewed: 03/20/2016 Elsevier Patient Education  2020 [employer].

## 2019-11-03 NOTE — Interval H&P Note (Signed)
History and Physical Interval Note:  11/03/2019 8:57 AM  Malik [name]  has presented today for surgery, with the diagnosis of PHIMOSIS.  The various methods of treatment have been discussed with the patient and family. After consideration of risks, benefits and other options for treatment, the patient has consented to  Procedure(s): CIRCUMCISION ADULT (N/A) as a surgical intervention.  The patient's history has been reviewed, patient examined, no change in status, stable for surgery.  I have reviewed the patient's chart and labs.  Questions were answered to the patient's satisfaction.     Bjorn Pippin

## 2019-11-04 ENCOUNTER — Encounter (HOSPITAL_COMMUNITY): Payer: Self-pay | Admitting: Urology

## 2019-11-25 DIAGNOSIS — U071 COVID-19: Secondary | ICD-10-CM | POA: Insufficient documentation

## 2019-11-25 HISTORY — DX: COVID-19: U07.1

## 2019-12-08 ENCOUNTER — Encounter: Payer: Self-pay | Admitting: Nurse Practitioner

## 2019-12-08 ENCOUNTER — Telehealth: Payer: Self-pay | Admitting: Nurse Practitioner

## 2019-12-08 ENCOUNTER — Other Ambulatory Visit: Payer: Self-pay | Admitting: Nurse Practitioner

## 2019-12-08 DIAGNOSIS — I10 Essential (primary) hypertension: Secondary | ICD-10-CM

## 2019-12-08 DIAGNOSIS — U071 COVID-19: Secondary | ICD-10-CM

## 2019-12-08 NOTE — Telephone Encounter (Signed)
I connected by phone with Malik [name] on 12/08/2019 at 11:11 AM to discuss the potential use of a new treatment for mild to moderate COVID-19 viral infection in non-hospitalized patients.  This patient is a 69 y.o. male that meets the FDA criteria for Emergency Use Authorization of COVID monoclonal antibody casirivimab/imdevimab.  Has a (+) direct SARS-CoV-2 viral test result  Has mild or moderate COVID-19   Is NOT hospitalized due to COVID-19  Is within 10 days of symptom onset  Has at least one of the high risk factor(s) for progression to severe COVID-19 and/or hospitalization as defined in EUA.  Specific high risk criteria : Older age (>/= 69 yo); HTN   I have spoken and communicated the following to the patient or parent/caregiver regarding COVID monoclonal antibody treatment:  1. FDA has authorized the emergency use for the treatment of mild to moderate COVID-19 in adults and pediatric patients with positive results of direct SARS-CoV-2 viral testing who are 51 years of age and older weighing at least 40 kg, and who are at high risk for progressing to severe COVID-19 and/or hospitalization.  2. The significant known and potential risks and benefits of COVID monoclonal antibody, and the extent to which such potential risks and benefits are unknown.  3. Information on available alternative treatments and the risks and benefits of those alternatives, including clinical trials.  4. Patients treated with COVID monoclonal antibody should continue to self-isolate and use infection control measures (e.g., wear mask, isolate, social distance, avoid sharing personal items, clean and disinfect "high touch" surfaces, and frequent handwashing) according to CDC guidelines.   5. The patient or parent/caregiver has the option to accept or refuse COVID monoclonal antibody treatment.  After reviewing this information with the patient, The patient agreed to proceed with receiving  casirivimab\imdevimab infusion and will be provided a copy of the Fact sheet prior to receiving the infusion.  Nicolasa Ducking, NP 12/08/2019 11:11 AM

## 2019-12-08 NOTE — Progress Notes (Signed)
I connected by phone with Malik [name] on 12/08/2019 at 11:12 AM to discuss the potential use of a new treatment for mild to moderate COVID-19 viral infection in non-hospitalized patients.  This patient is a 69 y.o. male that meets the FDA criteria for Emergency Use Authorization of COVID monoclonal antibody casirivimab/imdevimab.  Has a (+) direct SARS-CoV-2 viral test result  Has mild or moderate COVID-19   Is NOT hospitalized due to COVID-19  Is within 10 days of symptom onset  Has at least one of the high risk factor(s) for progression to severe COVID-19 and/or hospitalization as defined in EUA.  Specific high risk criteria : Older age (>/= 70 yo); HTN   I have spoken and communicated the following to the patient or parent/caregiver regarding COVID monoclonal antibody treatment:  1. FDA has authorized the emergency use for the treatment of mild to moderate COVID-19 in adults and pediatric patients with positive results of direct SARS-CoV-2 viral testing who are 62 years of age and older weighing at least 40 kg, and who are at high risk for progressing to severe COVID-19 and/or hospitalization.  2. The significant known and potential risks and benefits of COVID monoclonal antibody, and the extent to which such potential risks and benefits are unknown.  3. Information on available alternative treatments and the risks and benefits of those alternatives, including clinical trials.  4. Patients treated with COVID monoclonal antibody should continue to self-isolate and use infection control measures (e.g., wear mask, isolate, social distance, avoid sharing personal items, clean and disinfect "high touch" surfaces, and frequent handwashing) according to CDC guidelines.   5. The patient or parent/caregiver has the option to accept or refuse COVID monoclonal antibody treatment.  After reviewing this information with the patient, The patient agreed to proceed with receiving  casirivimab\imdevimab infusion and will be provided a copy of the Fact sheet prior to receiving the infusion.  Nicolasa Ducking, NP 12/08/2019 11:12 AM

## 2019-12-09 ENCOUNTER — Other Ambulatory Visit (HOSPITAL_COMMUNITY): Payer: Self-pay

## 2019-12-09 ENCOUNTER — Ambulatory Visit (HOSPITAL_COMMUNITY)
Admission: RE | Admit: 2019-12-09 | Discharge: 2019-12-09 | Disposition: A | Discharge status: Home / Self Care | Payer: Medicare Other | Source: Ambulatory Visit | Attending: Pulmonary Disease | Admitting: Pulmonary Disease

## 2019-12-09 DIAGNOSIS — I10 Essential (primary) hypertension: Secondary | ICD-10-CM | POA: Insufficient documentation

## 2019-12-09 DIAGNOSIS — Z23 Encounter for immunization: Secondary | ICD-10-CM | POA: Diagnosis not present

## 2019-12-09 DIAGNOSIS — U071 COVID-19: Secondary | ICD-10-CM | POA: Diagnosis present

## 2019-12-09 MED ORDER — DIPHENHYDRAMINE HCL 50 MG/ML IJ SOLN: 50.0000 mg | Freq: Once | INTRAMUSCULAR | Status: DC | PRN

## 2019-12-09 MED ORDER — SODIUM CHLORIDE 0.9 % IV SOLN: INTRAVENOUS | Status: DC | PRN

## 2019-12-09 MED ORDER — FAMOTIDINE IN NACL 20-0.9 MG/50ML-% IV SOLN: 20.0000 mg | Freq: Once | INTRAVENOUS | Status: DC | PRN

## 2019-12-09 MED ORDER — EPINEPHRINE 0.3 MG/0.3ML IJ SOAJ: 0.3000 mg | Freq: Once | INTRAMUSCULAR | Status: DC | PRN

## 2019-12-09 MED ORDER — SODIUM CHLORIDE 0.9 % IV SOLN
1200.0000 mg | Freq: Once | INTRAVENOUS | Status: AC
  Administered 2019-12-09: 1200 mg via INTRAVENOUS
  Filled 2019-12-09: qty 10

## 2019-12-09 MED ORDER — METHYLPREDNISOLONE SODIUM SUCC 125 MG IJ SOLR: 125.0000 mg | Freq: Once | INTRAMUSCULAR | Status: DC | PRN

## 2019-12-09 MED ORDER — ALBUTEROL SULFATE HFA 108 (90 BASE) MCG/ACT IN AERS: 2.0000 | INHALATION_SPRAY | Freq: Once | RESPIRATORY_TRACT | Status: DC | PRN

## 2019-12-09 NOTE — Discharge Instructions (Signed)

## 2019-12-09 NOTE — Progress Notes (Signed)
  Diagnosis: COVID-19  Physician: Dr Wright  Procedure: [facility] Med: casirivimab\imdevimab infusion - Provided patient with casirivimab\imdevimab fact sheet for patients, parents and caregivers prior to infusion.  Complications: No immediate complications noted.  Discharge: Discharged home   Chlora Mcbain Rudd 12/09/2019   

## 2020-03-31 NOTE — Telephone Encounter (Signed)
-----   Message from [place] sent at 03/31/2020  4:06 PM EST -----  Subject: Message to Provider    QUESTIONS  Information for Provider? Pt has pain in left hip and he will travelling   on 1/9 and he is asking if PCP can call in 4 days of muscle relaxer and   pain medication. I informed pt that he may need to be seen and he states   this has been done before.   ---------------------------------------------------------------------------  --------------  CALL BACK INFO  What is the best way for the office to contact you? OK to leave message on   voicemail  Preferred Call Back Phone Number? [phone]  ---------------------------------------------------------------------------  --------------  SCRIPT ANSWERS  Relationship to Patient? Self

## 2020-04-01 NOTE — Telephone Encounter (Signed)
I would be willing to do muscle relaxer but not pain meds, he can be seen in urgent care anywhere if needed

## 2020-05-09 ENCOUNTER — Encounter: Attending: Family | Primary: Family

## 2020-05-23 MED ORDER — LEVOTHYROXINE SODIUM 50 MCG PO TABS
50 MCG | ORAL_TABLET | ORAL | 0 refills | Status: DC
Start: 2020-05-23 — End: 2020-08-19

## 2020-05-23 NOTE — Telephone Encounter (Signed)
Lov 05/04/2019

## 2020-05-23 NOTE — Telephone Encounter (Signed)
Lvm for patient to call back.

## 2020-06-15 ENCOUNTER — Ambulatory Visit: Admit: 2020-06-15 | Discharge: 2020-06-15 | Payer: BLUE CROSS/BLUE SHIELD | Attending: Family | Primary: Family

## 2020-06-15 DIAGNOSIS — Z0000 Encounter for general adult medical examination without abnormal findings: Secondary | ICD-10-CM

## 2020-06-15 NOTE — Patient Instructions (Signed)
Personalized Preventive Plan for CAIDON FOTI - 06/15/2020  Medicare offers a range of preventive health benefits. Some of the tests and screenings are paid in full while other may be subject to a deductible, co-insurance, and/or copay.    Some of these benefits include a comprehensive review of your medical history including lifestyle, illnesses that may run in your family, and various assessments and screenings as appropriate.    After reviewing your medical record and screening and assessments performed today your provider may have ordered immunizations, labs, imaging, and/or referrals for you.  A list of these orders (if applicable) as well as your Preventive Care list are included within your After Visit Summary for your review.    Other Preventive Recommendations:    ?? A preventive eye exam performed by an eye specialist is recommended every 1-2 years to screen for glaucoma; cataracts, macular degeneration, and other eye disorders.  ?? A preventive dental visit is recommended every 6 months.  ?? Try to get at least 150 minutes of exercise per week or 10,000 steps per day on a pedometer .  ?? Order or download the FREE "Exercise & Physical Activity: Your Everyday Guide" from [employer]. Call (747) [phone] or search [employer] online.  ?? You need 1200-1500 mg of calcium and 1000-2000 IU of vitamin D per day. It is possible to meet your calcium requirement with diet alone, but a vitamin D supplement is usually necessary to meet this goal.  ?? When exposed to the sun, use a sunscreen that protects against both UVA and UVB radiation with an SPF of 30 or greater. Reapply every 2 to 3 hours or after sweating, drying off with a towel, or swimming.  ?? Always wear a seat belt when traveling in a car. Always wear a helmet when riding a bicycle or motorcycle.

## 2020-06-15 NOTE — Progress Notes (Signed)
Visit Information    Have you changed or started any medications since your last visit including any over-the-counter medicines, vitamins, or herbal medicines? no   Have you stopped taking any of your medications? Is so, why? -  no  Are you having any side effects from any of your medications? - no    Have you seen any other physician or provider since your last visit?  no   Have you had any other diagnostic tests since your last visit?  no   Have you been seen in the emergency room and/or had an admission in a hospital since we last saw you?  no   Have you had your routine dental cleaning in the past 6 months?  no     Do you have an active MyChart account? If no, what is the barrier?  No: na    Patient Care Team:  Tanna Furry, APRN - CNP as PCP - General (Nurse Practitioner)  Tanna Furry, APRN - CNP as PCP - [facility] Empaneled Provider  Alger Memos, MD    Medical History Review  Past Medical, Family, and Social History reviewed and  contribute to the patient presenting condition    Health Maintenance   Topic Date Due   ??? COVID-19 Vaccine (1) Never done   ??? Depression Screen  Never done   ??? Shingles Vaccine (1 of 2) Never done   ??? AAA screen  12/28/2014   ??? Flu vaccine (1) Never done   ??? A1C test (Diabetic or Prediabetic)  05/06/2020   ??? TSH testing  05/06/2020   ??? Lipid screen  05/06/2024   ??? Colorectal Cancer Screen  04/20/2025   ??? DTaP/Tdap/Td vaccine (3 - Td or Tdap) 06/14/2026   ??? Pneumococcal 65+ years Vaccine  Completed   ??? Hepatitis C screen  Addressed   ??? Hepatitis A vaccine  Aged Out   ??? Hepatitis B vaccine  Aged Out   ??? Hib vaccine  Aged Out   ??? Meningococcal (ACWY) vaccine  Aged Out

## 2020-06-15 NOTE — Progress Notes (Signed)
Medicare Annual Wellness Visit    Dustin Zimmerman is here for Medicare AWV (ref to urologist to have prostate checked,having urinary freq), Shortness of Breath (despite being active,this started last yr,feels he needs to see specialist, cardiologist or pulmonary?), and Spasms (tweaked back, states no one called him back,wants pain noted so if he calls he can get script w/out coming in. thinks it may be arthrits in hip radiating into back. pain comes/goes. stretches alot)    Assessment & Plan   Medicare annual wellness visit, subsequent  Check AAA screen  Declines vaccines  Patient advised to follow heart healthy, low fat  diet and 150 minutes of cardiovascular exercise per week   The nature of sun-induced photo-aging and skin cancers is discussed.  Sun avoidance, protective clothing, and the use of 30-SPF sunscreens is advised. Observe closely for skin damage/changes, and call if such occurs.  Dentist q73months    Routine general medical examination at a health care facility  Update labs  Will call with test results    -     Lipid Panel; Future  -     CBC with Auto Differential; Future  -     Comprehensive Metabolic Panel; Future  -     Hemoglobin A1C; Future  -     TSH; Future  Screening for lipoid disorders  -     Lipid Panel; Future  Hypothyroidism, unspecified type  Controlled?  Update labs  -     TSH; Future  Screening for diabetes mellitus  -     Hemoglobin A1C; Future  Left hip pain  Xray check for ongoing issues, he will call into office with name of meds he was given in the past for future need    -     XR HIP LEFT (2-3 VIEWS); Future  Screening for AAA (abdominal aortic aneurysm)    -     [place] SCREENING FOR AAA; Future  Urinary frequency  Declines meds  Limit fluids past 6 pm  Referral given for urology, please call for an appt  Check PSA    Nocturia  -     Amb External Referral To Urology  SOB (shortness of breath) on exertion  Had abnormal CT chest in the past and told to f/u he he never did  Former  smoker  Check cxr and referal given for pulmonary, please call for an appt    -     XR CHEST (2 VW); Future  -     Amb External Referral To Pulmonology  Former smoker  -     XR CHEST (2 VW); Future  -     Amb External Referral To Pulmonology      Recommendations for Preventive Services Due: see orders and patient instructions/AVS.  Recommended screening schedule for the next 5-10 years is provided to the patient in written form: see Patient Instructions/AVS.     Return for Medicare Annual Wellness Visit in 1 year.     Subjective   The following acute and/or chronic problems were also addressed today:  Here today for urology for increased urination at nighttime.  States he does not want any medication at this time and would like this.  Also states he would like referral for a lung doctor as he has been having some shortness of breath for the past few months with slight exertion.  Does exercise regularly is able to tolerate this well with no chest pain or sweats and has been able to  increase his pace and intensity of exercise with no problem.  He is a former smoker and has had asbestos exposure in the past.  Denies mucus production, cough or wheezing.  Also states he has ongoing left hip pain for several years.  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  He tried to call recently for pain medication and muscle relaxers but never got this.  Will call back.  He would like to be able to call in the future but when traveling to have some options for medications without having to be seen.  He has never had an x-ray of his hip and okay with this plan.    Patient's complete Health Risk Assessment and screening values have been reviewed and are found in Flowsheets. The following problems were reviewed today and where indicated follow up appointments were made and/or referrals ordered.    Positive Risk Factor Screenings with  Interventions:    Fall Risk:  Do you feel unsteady or are you worried about falling? : no  2 or more falls in past year?: no  Fall with injury in past year?: (!) yes     Fall Risk Interventions:    ?? Home safety tips provided he slipped on ice            General Health and ACP:  General  In general, how would you say your health is?: Very Good  In the past 7 days, have you experienced any of the following: New or Increased Pain, New or Increased Fatigue, Loneliness, Social Isolation, Stress or Anger?: No  Do you get the social and emotional support that you need?: Yes  Do you have a Living Will?: Yes    Advance Directives     Power of Attorney Living Will ACP-Advance Directive ACP-Power of Attorney    Not on File Not on File Not on File Not on File      General Health Risk Interventions:  ?? No Living Will: ACP documents already completed- patient asked to provide copy to the office     Hearing/Vision:  Do you or your family notice any trouble with your hearing that hasn't been managed with hearing aids?: (!) Yes  Do you have difficulty driving, watching TV, or doing any of your daily activities because of your eyesight?: (!) Yes  Have you had an eye exam within the past year?: Yes  No exam data present    Hearing/Vision Interventions:  ?? Hearing concerns:  patient declines any further evaluation/treatment for hearing issues  ?? Vision concerns:  patient encouraged to make appointment with his/her eye specialist    Safety:  Do you have working smoke detectors?: Yes  Do you have any tripping hazards - loose or unsecured carpets or rugs?: No  Do you have any tripping hazards - clutter in doorways, halls, or stairs?: No  Do you have either shower bars, grab bars, non-slip mats or non-slip surfaces in your shower or bathtub?: (!) No  Do all of your stairways have a railing or banister?: (!) No (na)  Do you always fasten your seatbelt when you are in a car?: Yes    Safety Interventions:  ?? Home safety tips provided            Objective   Vitals:    06/15/20 1556   BP: 136/82   Site: Left Upper Arm   Position: Sitting  Cuff Size: Medium Adult   Pulse: 78   Temp: 97.1 ??F (36.2 ??C)   TempSrc: Tympanic   SpO2: 98%   Weight: 213 lb 6.4 oz (96.8 kg)   Height: 6' (1.829 m)      Body mass index is 28.94 kg/m??.        General Appearance: alert and oriented to person, place and time, well-developed and well-nourished, in no acute distress  Pulmonary/Chest: clear to auscultation bilaterally- no wheezes, rales or rhonchi, normal air movement, no respiratory distress  Cardiovascular: normal rate, normal S1 and S2, no gallops, intact distal pulses and no carotid bruits  Extremities: no cyanosis, no clubbing and no edema  Musculoskeletal: normal range of motion, no joint swelling, deformity or tenderness  Neurologic: gait and coordination normal and speech normal       Allergies   Allergen Reactions   ??? Motrin [Ibuprofen] Other (See Comments)     kidney   ??? Percocet [Oxycodone-Acetaminophen] Hives   ??? Motrin [Ibuprofen] Nausea And Vomiting     kidney   ??? Percocet [Oxycodone-Acetaminophen] Rash     Prior to Visit Medications    Medication Sig Taking? Authorizing Provider   levothyroxine (SYNTHROID) 50 MCG tablet TAKE 1 TABLET DAILY Yes Tanna Furry, APRN - CNP   omeprazole (PRILOSEC) 40 MG delayed release capsule TAKE 1 CAPSULE BY MOUTH EVERY MORNING BEFORE BREAKFAST Yes Tanna Furry, APRN - CNP   dorzolamide-timolol (COSOPT) 22.3-6.8 MG/ML ophthalmic solution Place 2 drops into both eyes every 12 hours Yes Alger Memos, MD   nystatin (MYCOSTATIN) 100000 UNIT/GM ointment Apply topically 2 times daily.  Tanna Furry, APRN - CNP       CareTeam (Including outside providers/suppliers regularly involved in providing care):   Patient Care Team:  Tanna Furry, APRN - CNP as PCP - General (Nurse Practitioner)  Tanna Furry, APRN - CNP as PCP - [facility] Empaneled Provider  Alger Memos, MD    Reviewed and updated this visit:  Tobacco   Allergies   Meds    Problems   Med Hx   Surg Hx   Soc Hx   Fam Hx

## 2020-06-16 NOTE — Telephone Encounter (Signed)
-----   Message from [place] sent at 06/15/2020  4:40 PM EDT -----  Subject: Message to Provider    QUESTIONS  Information for Provider? pt wanted to call and give kelly smith the name   of the medication he has been taking-carisoprodol.   ---------------------------------------------------------------------------  --------------  CALL BACK INFO  What is the best way for the office to contact you? OK to leave message on   voicemail  Preferred Call Back Phone Number? [phone]  ---------------------------------------------------------------------------  --------------  SCRIPT ANSWERS  Relationship to Patient? Self

## 2020-06-16 NOTE — Telephone Encounter (Signed)
Attempted to contact patient but unable due to no vm.

## 2020-06-16 NOTE — Telephone Encounter (Signed)
Sorry but I do not prescribe that medication ever. I will continue with flexeril or steroid or a few days of tramadol if needed

## 2020-06-16 NOTE — Telephone Encounter (Signed)
Patient notified and verbalized understanding. Patient stated that he doesn't need anything as of now. He will call if needed.

## 2020-06-27 LAB — CBC WITH AUTO DIFFERENTIAL
Basophils %: 0.6 %
Basophils Absolute: 0 /??L
Eosinophils %: 2.5 %
Eosinophils Absolute: 0.2 /??L
Hematocrit: 44.6 % (ref 41–53)
Hemoglobin: 14.9 g/dL (ref 13.5–17.5)
Lymphocytes %: 18.1 %
Lymphocytes Absolute: 1.2 /??L
MCH: 30.8 pg
MCHC: 33.4 g/dL
MCV: 92 fL
MPV: 8.7 fL
Monocytes %: 11.7 %
Monocytes Absolute: 0.8 /??L
Neutrophils %: 67.1 %
Neutrophils Absolute: 4.5 /??L
Platelets: 274 K/??L
RBC: 4.84 10^6/??L
RDW: 13.9 %
WBC: 6.8 10^3/mL

## 2020-06-27 LAB — COMPREHENSIVE METABOLIC PANEL
ALT: 34 U/L
AST: 27 U/L
Albumin: 4.4
Alkaline Phosphatase: 62 U/L
Anion Gap: 7 mmol/L
BUN: 21 mg/dL
CO2: 31 mmol/L
Calcium: 9.4 mg/dL
Chloride: 104 mmol/L
Creatinine: 0.86
Glucose: 100 mg/dL
Potassium: 4.5 mmol/L
Sodium: 142 mmol/L
Total Bilirubin: 0.5 mg/dL (ref 0.1–1.4)
Total Protein: 7

## 2020-06-27 LAB — LIPID PANEL
Chol/HDL Ratio: 5
Cholesterol, Total: 260 mg/dL
HDL: 41 mg/dL (ref 35–70)
LDL Calculated: 143 mg/dL (ref 0–160)
Triglycerides: 108 mg/dL
VLDL: 22 mg/dL

## 2020-06-27 LAB — HEMOGLOBIN A1C
AVERAGE GLUCOSE: 126
Hemoglobin A1C: 6 %

## 2020-06-27 LAB — TSH: TSH: 2.82 u[IU]/mL

## 2020-06-29 ENCOUNTER — Encounter

## 2020-07-15 DIAGNOSIS — Z125 Encounter for screening for malignant neoplasm of prostate: Secondary | ICD-10-CM | POA: Diagnosis not present

## 2020-07-15 DIAGNOSIS — R7309 Other abnormal glucose: Secondary | ICD-10-CM | POA: Diagnosis not present

## 2020-07-15 DIAGNOSIS — E785 Hyperlipidemia, unspecified: Secondary | ICD-10-CM | POA: Diagnosis not present

## 2020-07-22 DIAGNOSIS — R82998 Other abnormal findings in urine: Secondary | ICD-10-CM | POA: Diagnosis not present

## 2020-08-19 DIAGNOSIS — H401131 Primary open-angle glaucoma, bilateral, mild stage: Secondary | ICD-10-CM | POA: Diagnosis not present

## 2020-08-19 MED ORDER — LEVOTHYROXINE SODIUM 50 MCG PO TABS
50 MCG | ORAL_TABLET | ORAL | 0 refills | Status: DC
Start: 2020-08-19 — End: 2020-11-17

## 2020-08-19 NOTE — Telephone Encounter (Signed)
LOV: 06/15/2020

## 2020-10-03 DIAGNOSIS — H401131 Primary open-angle glaucoma, bilateral, mild stage: Secondary | ICD-10-CM | POA: Diagnosis not present

## 2020-10-17 DIAGNOSIS — M170 Bilateral primary osteoarthritis of knee: Secondary | ICD-10-CM | POA: Diagnosis not present

## 2020-11-17 MED ORDER — LEVOTHYROXINE SODIUM 50 MCG PO TABS
50 MCG | ORAL_TABLET | ORAL | 2 refills | Status: DC
Start: 2020-11-17 — End: 2021-04-21

## 2020-11-17 NOTE — Telephone Encounter (Signed)
LOV: 06/15/2020

## 2021-02-03 DIAGNOSIS — I10 Essential (primary) hypertension: Secondary | ICD-10-CM | POA: Diagnosis not present

## 2021-02-03 DIAGNOSIS — I2510 Atherosclerotic heart disease of native coronary artery without angina pectoris: Secondary | ICD-10-CM | POA: Diagnosis not present

## 2021-02-03 DIAGNOSIS — E785 Hyperlipidemia, unspecified: Secondary | ICD-10-CM | POA: Diagnosis not present

## 2021-02-03 DIAGNOSIS — R7309 Other abnormal glucose: Secondary | ICD-10-CM | POA: Diagnosis not present

## 2021-04-10 DIAGNOSIS — H401131 Primary open-angle glaucoma, bilateral, mild stage: Secondary | ICD-10-CM | POA: Diagnosis not present

## 2021-04-18 ENCOUNTER — Telehealth

## 2021-04-18 NOTE — Telephone Encounter (Signed)
-----   Message from Claris Gower sent at 04/18/2021  8:44 AM EST -----  Subject: Referral Request    Reason for referral request? Patient needs blood work orders for a   physical  Provider patient wants to be referred to(if known):     Provider Phone Number(if known):    Additional Information for Provider?   ---------------------------------------------------------------------------  --------------  Cleotis Lema INFO    [phone]; OK to leave message on voicemail  ---------------------------------------------------------------------------  --------------

## 2021-04-19 NOTE — Telephone Encounter (Signed)
Orders placed

## 2021-04-19 NOTE — Telephone Encounter (Signed)
-----   Message from Shela Leff sent at 04/18/2021  8:58 AM EST -----  Subject: Appointment Request    Reason for Call: Established Patient Appointment needed: Routine Medicare   AWV    QUESTIONS    Reason for appointment request? No appointments available during search     Additional Information for Provider? Patient requesting yearly physical.   No appointments available. Requesting call back. Patient also needs a   prescription for bloodwork prior to appointment.   ---------------------------------------------------------------------------  --------------  Cleotis Lema INFO  [phone]; OK to leave message on voicemail  ---------------------------------------------------------------------------  --------------  SCRIPT ANSWERS  COVID Screen: [place]

## 2021-04-19 NOTE — Telephone Encounter (Signed)
Patient made AWV appt for 3/27. Wants blood work ordered to have done prior.

## 2021-04-20 NOTE — Telephone Encounter (Signed)
LVM to call the office.

## 2021-04-21 MED ORDER — LEVOTHYROXINE SODIUM 50 MCG PO TABS
50 MCG | ORAL_TABLET | ORAL | 0 refills | Status: AC
Start: 2021-04-21 — End: 2021-06-21

## 2021-04-21 NOTE — Telephone Encounter (Signed)
LVM to call the office.

## 2021-04-21 NOTE — Telephone Encounter (Signed)
[name] is calling to request a refill on the following medication(s):    Last Visit Date (If Applicable):  06/15/2020    Next Visit Date:    06/19/2021    Medication Request:  Requested Prescriptions     Pending Prescriptions Disp Refills    levothyroxine (SYNTHROID) 50 MCG tablet 90 tablet 2

## 2021-06-19 ENCOUNTER — Encounter: Payer: BLUE CROSS/BLUE SHIELD | Attending: Family | Primary: Family

## 2021-06-21 MED ORDER — LEVOTHYROXINE SODIUM 50 MCG PO TABS
50 MCG | ORAL_TABLET | ORAL | 3 refills | Status: AC
Start: 2021-06-21 — End: 2022-05-04

## 2021-06-21 NOTE — Telephone Encounter (Signed)
Lv 06/15/2020

## 2021-07-26 ENCOUNTER — Ambulatory Visit: Admit: 2021-07-26 | Discharge: 2021-07-26 | Payer: BLUE CROSS/BLUE SHIELD | Attending: Family | Primary: Family

## 2021-07-26 DIAGNOSIS — Z0000 Encounter for general adult medical examination without abnormal findings: Secondary | ICD-10-CM

## 2021-07-26 NOTE — Progress Notes (Signed)
Medicare Annual Wellness Visit    Dustin Zimmerman is here for Medicare AWV    Assessment & Plan   Medicare annual wellness visit, subsequent  Doing great overall  Update labs  Will call with test results  Patient advised to follow a low carb, low sugar, healthy fat ( avocados, nuts, olive oil etc.) diet and 150 minutes of cardiovascular exercise per week   Continue with other specialists    -     CBC with Auto Differential; Future  -     Comprehensive Metabolic Panel; Future  Screening for lipoid disorders  -     Lipid Panel; Future  Diabetes mellitus screening  -     Hemoglobin A1C; Future  Elevated random blood glucose level  Hypothyroidism, unspecified type  Controlled?  Update labs  Continue same dose for now    -     T4, Free; Future  -     TSH; Future  Special screening for malignant neoplasm of prostate  -     PSA Screening; Future      Recommendations for Preventive Services Due: see orders and patient instructions/AVS.  Recommended screening schedule for the next 5-10 years is provided to the patient in written form: see Patient Instructions/AVS.     No follow-ups on file.     Subjective   71 y/o male present for well visit. Pt has no other concerns today.    Patient's complete Health Risk Assessment and screening values have been reviewed and are found in Flowsheets. The following problems were reviewed today and where indicated follow up appointments were made and/or referrals ordered.    Positive Risk Factor Screenings with Interventions:               General HRA Questions:  Select all that apply: (!) Stress    Stress Interventions:  Patient declined any further interventions or treatment         Hearing Screen:  Do you or your family notice any trouble with your hearing that hasn't been managed with hearing aids?: (!) Yes    Interventions:  Patient declines any further evaluation or treatment     Safety:  Do you have either shower bars, grab bars, non-slip mats or non-slip surfaces in your shower or  bathtub?: (!) No  Interventions:  Patient declined any further interventions or treatment                     Objective   Vitals:    07/26/21 1600   BP: 126/72   Site: Left Upper Arm   Position: Sitting   Cuff Size: Medium Adult   Pulse: 77   Temp: 97.1 F (36.2 C)   TempSrc: Tympanic   SpO2: 96%   Weight: 215 lb 12.8 oz (97.9 kg)   Height: 6' (1.829 m)      Body mass index is 29.27 kg/m.        General Appearance: alert and oriented to person, place and time, well developed and well- nourished, in no acute distress  Skin: warm and dry, no rash or erythema  Head: normocephalic and atraumatic  Eyes: pupils equal, round, and reactive to light, extraocular eye movements intact, conjunctivae normal  ENT: tympanic membrane, external ear and ear canal normal bilaterally, nose without deformity, nasal mucosa and turbinates normal without polyps  Neck: supple and non-tender without mass, no thyromegaly or thyroid nodules, no cervical lymphadenopathy  Pulmonary/Chest: clear to auscultation bilaterally- no wheezes, rales  or rhonchi, normal air movement, no respiratory distress  Cardiovascular: normal rate, regular rhythm, normal S1 and S2, no murmurs, rubs, clicks, or gallops, distal pulses intact, no carotid bruits  Abdomen: soft, non-tender, non-distended, normal bowel sounds, no masses or organomegaly  Extremities: no cyanosis, clubbing or edema  Musculoskeletal: normal range of motion, no joint swelling, deformity or tenderness  Neurologic: reflexes normal and symmetric, no cranial nerve deficit, gait, coordination and speech normal       Allergies   Allergen Reactions    Motrin [Ibuprofen] Other (See Comments)     kidney    Percocet [Oxycodone-Acetaminophen] Hives    Motrin [Ibuprofen] Nausea And Vomiting     kidney    Percocet [Oxycodone-Acetaminophen] Rash     Prior to Visit Medications    Medication Sig Taking? Authorizing Provider   pantoprazole (PROTONIX) 40 MG tablet  Yes Historical Provider, MD   diphenhydrAMINE  HCl (BENADRYL ALLERGY PO) Take by mouth To help sleep and skin itching Yes Historical Provider, MD   levothyroxine (SYNTHROID) 50 MCG tablet TAKE 1 TABLET DAILY Yes Tim Lair, APRN - CNP   dorzolamide-timolol (COSOPT) 22.3-6.8 MG/ML ophthalmic solution Place 2 drops into both eyes every 12 hours Yes Alger Memos, MD   omeprazole (PRILOSEC) 40 MG delayed release capsule TAKE 1 CAPSULE BY MOUTH EVERY MORNING BEFORE BREAKFAST  Kris Mouton, APRN - CNP   nystatin (MYCOSTATIN) 100000 UNIT/GM ointment Apply topically 2 times daily.  Kris Mouton, APRN - CNP       CareTeam (Including outside providers/suppliers regularly involved in providing care):   Patient Care Team:  Kris Mouton, APRN - CNP as PCP - General (Nurse Practitioner)  Kris Mouton, APRN - CNP as PCP - Empaneled Provider  Alger Memos, MD     Reviewed and updated this visit:  Tobacco  Allergies  Meds  Problems  Med Hx  Surg Hx  Soc Hx  Fam Hx               Kris Mouton, APRN - CNP

## 2021-07-26 NOTE — Patient Instructions (Signed)
Learning About Stress  What is stress?     Stress is your body's response to a hard situation. Your body can have a physical, emotional, or mental response. Stress is a fact of life for most people, and it affects everyone differently. What causes stress for you may not be stressful for someone else.  A lot of things can cause stress. You may feel stress when you go on a job interview, take a test, or run a race. This kind of short-term stress is normal and even useful. It can help you if you need to work hard or react quickly. For example, stress can help you finish an important job on time.  Long-term stress is caused by ongoing stressful situations or events. Examples of long-term stress include long-term health problems, ongoing problems at work, or conflicts in your family. Long-term stress can harm your health.  How does stress affect your health?  When you are stressed, your body responds as though you are in danger. It makes hormones that speed up your heart, make you breathe faster, and give you a burst of energy. This is called the fight-or-flight stress response. If the stress is over quickly, your body goes back to normal and no harm is done.  But if stress happens too often or lasts too long, it can have bad effects. Long-term stress can make you more likely to get sick, and it can make symptoms of some diseases worse. If you tense up when you are stressed, you may develop neck, shoulder, or low back pain. Stress is linked to high blood pressure and heart disease.  Stress also harms your emotional health. It can make you moody, tense, or depressed. Your relationships may suffer, and you may not do well at work or school.  What can you do to manage stress?  You can try these things to help manage stress:   Do something active. Exercise or activity can help reduce stress. Walking is a great way to get started. Even everyday activities such as housecleaning or yard work can help.  Try yoga or tai chi.  These techniques combine exercise and meditation. You may need some training at first to learn them.  Do something you enjoy. For example, listen to music or go to a movie. Practice your hobby or do volunteer work.  Meditate. This can help you relax, because you are not worrying about what happened before or what may happen in the future.  Do guided imagery. Imagine yourself in any setting that helps you feel calm. You can use online videos, books, or a teacher to guide you.  Do breathing exercises. For example:  From a standing position, bend forward from the waist with your knees slightly bent. Let your arms dangle close to the floor.  Breathe in slowly and deeply as you return to a standing position. Roll up slowly and lift your head last.  Hold your breath for just a few seconds in the standing position.  Breathe out slowly and bend forward from the waist.  Let your feelings out. Talk, laugh, cry, and express anger when you need to. Talking with supportive friends or family, a Social worker, or a faith leader about your feelings is a healthy way to relieve stress. Avoid discussing your feelings with people who make you feel worse.  Write. It may help to write about things that are bothering you. This helps you find out how much stress you feel and what is causing it. When you  know this, you can find better ways to cope.  What can you do to prevent stress?  Manage your time. This helps you find time to do the things you want and need to do.  Get enough sleep. Your body recovers from the stresses of the day while you are sleeping.  Get support. Your family, friends, and community can make a difference in how you experience stress.  Limit your news feed. Avoid or limit time on social media or news that may make you feel stressed.  Do something active. Exercise or activity can help reduce stress. Walking is a great way to get started.  Where can you learn more?  Go to https://www.bennett.info/ and enter N032  to learn more about "Learning About Stress."  Current as of: June 6, 2022Content Version: 13.6   2006-2023 Health[employer], Inc[employer].   Care instructions adapted under license by [facility]. If you have questions about a medical condition or this instruction, always ask your healthcare professional. [place] any warranty or liability for your use of this information.           Hearing Loss: Care Instructions  Overview     Hearing loss is a sudden or slow decrease in how well you hear. It can range from mild to severe. Permanent hearing loss can occur with aging. It also can happen when you are exposed long-term to loud noise. Examples include listening to loud music, riding motorcycles, or being around other loud machines.  Hearing loss can affect your work and home life. It can make you feel lonely or depressed. You may feel that you have lost your independence. But hearing aids and other devices can help you hear better and feel connected to others.  Follow-up care is a key part of your treatment and safety. Be sure to make and go to all appointments, and call your doctor if you are having problems. It's also a good idea to know your test results and keep a list of the medicines you take.  How can you care for yourself at home?  Avoid loud noises whenever possible. This helps keep your hearing from getting worse.  Always wear hearing protection around loud noises.  Wear a hearing aid as directed. See a professional who can help you pick a hearing aid that fits you.  Have hearing tests as your doctor suggests. They can show whether your hearing has changed. Your hearing aid may need to be adjusted.  Use other devices as needed. These may include:  Telephone amplifiers and hearing aids that can connect to a television, stereo, radio, or microphone.  Devices that use lights or vibrations. These alert you to the doorbell, a ringing telephone, or a baby monitor.  Television  closed-captioning. This shows the words at the bottom of the screen. Most new TVs can do this.  TTY (text telephone). This lets you type messages back and forth on the telephone instead of talking or listening. These devices are also called TDD. When messages are typed on the keyboard, they are sent over the phone line to a receiving TTY. The message is shown on a monitor.  Use text messaging, social media, and email if it is hard for you to communicate by telephone.  Try to learn a listening technique called speechreading. It is not lipreading. You pay attention to people's gestures, expressions, posture, and tone of voice. These clues can help you understand what a person is saying. Face the person you are talking  to, and have them face you. Make sure the lighting is good. You need to see the other person's face clearly.  Think about counseling if you need help to adjust to your hearing loss.  When should you call for help?  Watch closely for changes in your health, and be sure to contact your doctor if:   You think your hearing is getting worse.    You have new symptoms, such as dizziness or nausea.   Where can you learn more?  Go to https://www.bennett.info/ and enter R798 to learn more about "Hearing Loss: Care Instructions."  Current as of: May 4, 2022Content Version: 13.6   2006-2023 Healthwise, Incorporated.   Care instructions adapted under license by [facility]. If you have questions about a medical condition or this instruction, always ask your healthcare professional. [place] any warranty or liability for your use of this information.           A Healthy Heart: Care Instructions  Your Care Instructions     Coronary artery disease, also called heart disease, occurs when a substance called plaque builds up in the vessels that supply oxygen-rich blood to your heart muscle. This can narrow the blood vessels and reduce blood flow. A heart attack  happens when blood flow is completely blocked. A high-fat diet, smoking, and other factors increase the risk of heart disease.  Your doctor has found that you have a chance of having heart disease. You can do lots of things to keep your heart healthy. It may not be easy, but you can change your diet, exercise more, and quit smoking. These steps really work to lower your chance of heart disease.  Follow-up care is a key part of your treatment and safety. Be sure to make and go to all appointments, and call your doctor if you are having problems. It's also a good idea to know your test results and keep a list of the medicines you take.  How can you care for yourself at home?  Diet   Use less salt when you cook and eat. This helps lower your blood pressure. Taste food before salting. Add only a little salt when you think you need it. With time, your taste buds will adjust to less salt.    Eat fewer snack items, fast foods, canned soups, and other high-salt, high-fat, processed foods.    Read food labels and try to avoid saturated and trans fats. They increase your risk of heart disease by raising cholesterol levels.    Limit the amount of solid fat-butter, margarine, and shortening-you eat. Use olive, peanut, or canola oil when you cook. Bake, broil, and steam foods instead of frying them.    Eat a variety of fruit and vegetables every day. Dark green, deep orange, red, or yellow fruits and vegetables are especially good for you. Examples include spinach, carrots, peaches, and berries.    Foods high in fiber can reduce your cholesterol and provide important vitamins and minerals. High-fiber foods include whole-grain cereals and breads, oatmeal, beans, brown rice, citrus fruits, and apples.    Eat lean proteins. Heart-healthy proteins include seafood, lean meats and poultry, eggs, beans, peas, nuts, seeds, and soy products.    Limit drinks and foods with added sugar. These include candy, desserts, and soda pop.    Lifestyle changes   If your doctor recommends it, get more exercise. Walking is a good choice. Bit by bit, increase the amount you walk every day. Try for  at least 30 minutes on most days of the week. You also may want to swim, bike, or do other activities.    Do not smoke. If you need help quitting, talk to your doctor about stop-smoking programs and medicines. These can increase your chances of quitting for good. Quitting smoking may be the most important step you can take to protect your heart. It is never too late to quit.    Limit alcohol to 2 drinks a day for men and 1 drink a day for women. Too much alcohol can cause health problems.    Manage other health problems such as diabetes, high blood pressure, and high cholesterol. If you think you may have a problem with alcohol or drug use, talk to your doctor.   Medicines   Take your medicines exactly as prescribed. Call your doctor if you think you are having a problem with your medicine.    If your doctor recommends aspirin, take the amount directed each day. Make sure you take aspirin and not another kind of pain reliever, such as acetaminophen (Tylenol).   When should you call for help?   Call 911 if you have symptoms of a heart attack. These may include:   Chest pain or pressure, or a strange feeling in the chest.    Sweating.    Shortness of breath.    Pain, pressure, or a strange feeling in the back, neck, jaw, or upper belly or in one or both shoulders or arms.    Lightheadedness or sudden weakness.    A fast or irregular heartbeat.   After you call 911, the operator may tell you to chew 1 adult-strength or 2 to 4 low-dose aspirin. Wait for an ambulance. Do not try to drive yourself.  Watch closely for changes in your health, and be sure to contact your doctor if you have any problems.  Where can you learn more?  Go to https://www.bennett.info/ and enter F075 to learn more about "A Healthy Heart: Care Instructions."  Current as  of: September 7, 2022Content Version: 13.6   2006-2023 Healthwise, Incorporated.   Care instructions adapted under license by [facility]. If you have questions about a medical condition or this instruction, always ask your healthcare professional. [place] any warranty or liability for your use of this information.      Personalized Preventive Plan for ZOE NORDIN - 07/26/2021  Medicare offers a range of preventive health benefits. Some of the tests and screenings are paid in full while other may be subject to a deductible, co-insurance, and/or copay.    Some of these benefits include a comprehensive review of your medical history including lifestyle, illnesses that may run in your family, and various assessments and screenings as appropriate.    After reviewing your medical record and screening and assessments performed today your provider may have ordered immunizations, labs, imaging, and/or referrals for you.  A list of these orders (if applicable) as well as your Preventive Care list are included within your After Visit Summary for your review.    Other Preventive Recommendations:    A preventive eye exam performed by an eye specialist is recommended every 1-2 years to screen for glaucoma; cataracts, macular degeneration, and other eye disorders.  A preventive dental visit is recommended every 6 months.  Try to get at least 150 minutes of exercise per week or 10,000 steps per day on a pedometer .  Order or download the FREE "Exercise & Physical Activity:  Your Everyday Guide" from [employer] on [place]. Call 6267409522 or search [employer] Aging online.  You need 1200-1500 mg of calcium and 1000-2000 IU of vitamin D per day. It is possible to meet your calcium requirement with diet alone, but a vitamin D supplement is usually necessary to meet this goal.  When exposed to the sun, use a sunscreen that protects against both UVA and UVB  radiation with an SPF of 30 or greater. Reapply every 2 to 3 hours or after sweating, drying off with a towel, or swimming.  Always wear a seat belt when traveling in a car. Always wear a helmet when riding a bicycle or motorcycle.

## 2021-07-26 NOTE — Progress Notes (Signed)
Visit Information    Have you changed or started any medications since your last visit including any over-the-counter medicines, vitamins, or herbal medicines? no   Have you stopped taking any of your medications? Is so, why? -  no  Are you having any side effects from any of your medications? - no    Have you seen any other physician or provider since your last visit?  no   Have you had any other diagnostic tests since your last visit?  no   Have you been seen in the emergency room and/or had an admission in a hospital since we last saw you?  no   Have you had your routine dental cleaning in the past 6 months?  no     Do you have an active MyChart account? If no, what is the barrier?  No: na    Patient Care Team:  Kris Mouton, APRN - CNP as PCP - General (Nurse Practitioner)  Kris Mouton, APRN - CNP as PCP - Empaneled Provider  Alger Memos, MD    Medical History Review  Past Medical, Family, and Social History reviewed and  contribute to the patient presenting condition    Health Maintenance   Topic Date Due    COVID-19 Vaccine (1) Never done    Shingles vaccine (1 of 2) Never done    AAA screen  12/28/2014    Depression Screen  06/15/2021    A1C test (Diabetic or Prediabetic)  06/27/2021    Flu vaccine (Season Ended) 10/24/2021    Colorectal Cancer Screen  04/20/2025    Lipids  06/27/2025    DTaP/Tdap/Td vaccine (3 - Td or Tdap) 06/14/2026    Pneumococcal 65+ years Vaccine  Completed    Hepatitis C screen  Addressed    Hepatitis A vaccine  Aged Out    Hib vaccine  Aged Out    Meningococcal (ACWY) vaccine  Aged Out

## 2021-08-17 DIAGNOSIS — I350 Nonrheumatic aortic (valve) stenosis: Secondary | ICD-10-CM | POA: Diagnosis not present

## 2021-08-17 DIAGNOSIS — I10 Essential (primary) hypertension: Secondary | ICD-10-CM | POA: Diagnosis not present

## 2021-08-17 DIAGNOSIS — E785 Hyperlipidemia, unspecified: Secondary | ICD-10-CM | POA: Diagnosis not present

## 2021-08-17 DIAGNOSIS — I2510 Atherosclerotic heart disease of native coronary artery without angina pectoris: Secondary | ICD-10-CM | POA: Diagnosis not present

## 2021-10-09 DIAGNOSIS — H401131 Primary open-angle glaucoma, bilateral, mild stage: Secondary | ICD-10-CM | POA: Diagnosis not present

## 2021-11-07 NOTE — Progress Notes (Signed)
CARDIOLOGY CONSULT NOTE       Patient ID: Malik [name] MRN: [id number] DOB/AGE: 09-26-1950 71 y.o.  Admit date: (Not on file) Referring Physician: [facility] Primary Physician: Adrian Prince, MD Primary Cardiologist: New Reason for Consultation: Chest Pain  Active Problems:   * No active hospital problems. *   HPI:  71 y.o. referred by Dr Evlyn Kanner for chest pain History of obesity, HTN, HLD, glucose intolerance and murmur. 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 No rest pain. Sits and tightness goes away Discussed fact that this could be from severe AS or epicardial CAD. Discussed need for f/u echo and right and left cath likely His wife has seen Dr Excell Seltzer for [facility] and he would like him to do cath or Dr Clifton James   Risks including stroke, contrast reaction bleeding and need for emergency surgery discussed willing to proceed pending echo  He is overweight but has lost over 40 lbs in last 2 years. He sees [employer] claims handler for dental Last a month ago and teeth in good shape   ROS All other systems reviewed and negative except as noted above  Past Medical History:  Diagnosis Date   Aortic stenosis    Arthritis    ASHD (arteriosclerotic heart disease)    Chest pressure    COVID-19 virus infection 11/2019   GERD (gastroesophageal reflux disease)    Heart murmur    History of kidney stones    Hyperglycemia    Hyperlipidemia    Hypertension    Nephrolithiasis    Obesity     Family History  Problem Relation Age of Onset   Cancer Mother    Heart disease Father    Heart disease Brother    Cancer Brother        throat cancer    Social  History   Socioeconomic History   Marital status: Divorced    Spouse name: Not on file   Number of children: 1   Years of education: 12   Highest education level: Not on file  Occupational History   Not on file  Tobacco Use   Smoking status: Former    Years: 20.00    Types: Cigarettes, Cigars    Quit date: 08/19/1978    Years since quitting: 43.2   Smokeless tobacco: Former  Building services engineer Use: Never used  Substance and Sexual Activity   Alcohol use: No   Drug use: No   Sexual activity: Not on file  Other Topics Concern   Not on file  Social History Narrative   Not on file   Social Determinants of Health   Financial Resource Strain: Not on file  Food Insecurity: Not on file  Transportation Needs: Not on file  Physical Activity: Not on file  Stress: Not on file  Social Connections: Not on file  Intimate Partner Violence: Not on file    Past Surgical History:  Procedure Laterality Date   APPENDECTOMY     CIRCUMCISION N/A 11/03/2019   Procedure: CIRCUMCISION ADULT;  Surgeon: Bjorn Pippin, MD;  Location: [facility];  Service: Urology;  Laterality: N/A;      Current Outpatient Medications:    aspirin 325 MG EC tablet, Take 325 mg by mouth at bedtime. , Disp: , Rfl:    lisinopril-hydrochlorothiazide (PRINZIDE,ZESTORETIC) 20-25 MG tablet, Take 1 tablet by mouth daily. (Patient taking differently: Take 1 tablet by mouth at bedtime. ), Disp: 90 tablet, Rfl: 1   metoprolol succinate (TOPROL-XL) 100 MG 24 hr tablet, Take 1 tablet (100 mg total) by mouth daily. Take with or immediately following a meal. (Patient taking differently: Take 100 mg by mouth at bedtime. Take with or immediately following a meal.), Disp: 90 tablet, Rfl: 1   simvastatin (ZOCOR) 80 MG tablet, TAKE 1 TABLET(80 MG) BY MOUTH AT BEDTIME (Patient taking differently: Take 80 mg by mouth at bedtime. ), Disp: 90 tablet, Rfl: 1    Physical Exam: There were no vitals taken for this visit.    Affect  appropriate Healthy:  appears stated age HEENT: normal Neck supple with no adenopathy JVP normal no bruits no thyromegaly Lungs clear with no wheezing and good diaphragmatic motion Heart:  S1/S2 diminished AS late peaking  murmur, no rub, gallop or click PMI normal Abdomen: benighn, BS positve, no tenderness, no AAA no bruit.  No HSM or HJR Distal pulses intact with no bruits No edema Neuro non-focal Skin warm and dry No muscular weakness   Labs:   Lab Results  Component Value Date   WBC 6.3 11/02/2019   HGB 15.4 11/02/2019   HCT 45.7 11/02/2019   MCV 91.6 11/02/2019   PLT 192 11/02/2019   No results for input(s): "NA", "K", "CL", "CO2", "BUN", "CREATININE", "CALCIUM", "PROT", "BILITOT", "ALKPHOS", "ALT", "AST", "GLUCOSE" in the last 168 hours.  Invalid input(s): "LABALBU" No results found for: "CKTOTAL", "CKMB", "CKMBINDEX", "TROPONINI"  Lab Results  Component Value Date   CHOL 144 08/12/2017   CHOL 139 04/04/2017   CHOL 137 10/02/2016   Lab Results  Component Value Date   HDL 30 (L) 08/12/2017   HDL 31 (L) 04/04/2017   HDL 31 (L) 10/02/2016   Lab Results  Component Value Date   LDLCALC 91 08/12/2017   LDLCALC 88 04/04/2017   LDLCALC 86 10/02/2016   Lab Results  Component Value Date   TRIG 117 08/12/2017   TRIG 98 04/04/2017   TRIG 101 10/02/2016   Lab Results  Component Value Date   CHOLHDL 4.8 08/12/2017   CHOLHDL 4.5 04/04/2017   CHOLHDL 4.4 10/02/2016   No results found for: "LDLDIRECT"    Radiology: No results found.  EKG: SR rate 59 normal 11/08/2021 SR rate 59 normal     ASSESSMENT AND PLAN:   Chest Pain:  likely from severe AS vs CAD will need right and left cath will arrange after getting echo to assess his AS.  AS:  update echo likely progression to severe given symptoms He understands that after heart cath he will likely need surgery for severe AS  HTN:  continue current meds including HCTZ/ACE and Torprol HLD:  continue statin labs  with primary    TTE Then likely labs and heart cath   F/U post cath   Signed: Ronn Smolinsky 11/07/2021, 7:28 AM   

## 2021-11-07 NOTE — H&P (View-Only) (Signed)
CARDIOLOGY CONSULT NOTE       Patient ID: KEIMARI Logan MRN: [id number] DOB/AGE: 09-26-1950 71 y.o.  Admit date: (Not on file) Referring Physician: [facility] Primary Physician: Adrian Prince, MD Primary Cardiologist: New Reason for Consultation: Chest Pain  Active Problems:   * No active hospital problems. *   HPI:  71 y.o. referred by Dr Evlyn Kanner for chest pain History of obesity, HTN, HLD, glucose intolerance and murmur. 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 No rest pain. Sits and tightness goes away Discussed fact that this could be from severe AS or epicardial CAD. Discussed need for f/u echo and right and left cath likely His wife has seen Dr Excell Seltzer for [facility] and he would like him to do cath or Dr Clifton James   Risks including stroke, contrast reaction bleeding and need for emergency surgery discussed willing to proceed pending echo  He is overweight but has lost over 40 lbs in last 2 years. He sees [employer] claims handler for dental Last a month ago and teeth in good shape   ROS All other systems reviewed and negative except as noted above  Past Medical History:  Diagnosis Date   Aortic stenosis    Arthritis    ASHD (arteriosclerotic heart disease)    Chest pressure    COVID-19 virus infection 11/2019   GERD (gastroesophageal reflux disease)    Heart murmur    History of kidney stones    Hyperglycemia    Hyperlipidemia    Hypertension    Nephrolithiasis    Obesity     Family History  Problem Relation Age of Onset   Cancer Mother    Heart disease Father    Heart disease Brother    Cancer Brother        throat cancer    Social  History   Socioeconomic History   Marital status: Divorced    Spouse name: Not on file   Number of children: 1   Years of education: 12   Highest education level: Not on file  Occupational History   Not on file  Tobacco Use   Smoking status: Former    Years: 20.00    Types: Cigarettes, Cigars    Quit date: 08/19/1978    Years since quitting: 43.2   Smokeless tobacco: Former  Building services engineer Use: Never used  Substance and Sexual Activity   Alcohol use: No   Drug use: No   Sexual activity: Not on file  Other Topics Concern   Not on file  Social History Narrative   Not on file   Social Determinants of Health   Financial Resource Strain: Not on file  Food Insecurity: Not on file  Transportation Needs: Not on file  Physical Activity: Not on file  Stress: Not on file  Social Connections: Not on file  Intimate Partner Violence: Not on file    Past Surgical History:  Procedure Laterality Date   APPENDECTOMY     CIRCUMCISION N/A 11/03/2019   Procedure: CIRCUMCISION ADULT;  Surgeon: Bjorn Pippin, MD;  Location: [facility];  Service: Urology;  Laterality: N/A;      Current Outpatient Medications:    aspirin 325 MG EC tablet, Take 325 mg by mouth at bedtime. , Disp: , Rfl:    lisinopril-hydrochlorothiazide (PRINZIDE,ZESTORETIC) 20-25 MG tablet, Take 1 tablet by mouth daily. (Patient taking differently: Take 1 tablet by mouth at bedtime. ), Disp: 90 tablet, Rfl: 1   metoprolol succinate (TOPROL-XL) 100 MG 24 hr tablet, Take 1 tablet (100 mg total) by mouth daily. Take with or immediately following a meal. (Patient taking differently: Take 100 mg by mouth at bedtime. Take with or immediately following a meal.), Disp: 90 tablet, Rfl: 1   simvastatin (ZOCOR) 80 MG tablet, TAKE 1 TABLET(80 MG) BY MOUTH AT BEDTIME (Patient taking differently: Take 80 mg by mouth at bedtime. ), Disp: 90 tablet, Rfl: 1    Physical Exam: There were no vitals taken for this visit.    Affect  appropriate Healthy:  appears stated age HEENT: normal Neck supple with no adenopathy JVP normal no bruits no thyromegaly Lungs clear with no wheezing and good diaphragmatic motion Heart:  S1/S2 diminished AS late peaking  murmur, no rub, gallop or click PMI normal Abdomen: benighn, BS positve, no tenderness, no AAA no bruit.  No HSM or HJR Distal pulses intact with no bruits No edema Neuro non-focal Skin warm and dry No muscular weakness   Labs:   Lab Results  Component Value Date   WBC 6.3 11/02/2019   HGB 15.4 11/02/2019   HCT 45.7 11/02/2019   MCV 91.6 11/02/2019   PLT 192 11/02/2019   No results for input(s): "NA", "K", "CL", "CO2", "BUN", "CREATININE", "CALCIUM", "PROT", "BILITOT", "ALKPHOS", "ALT", "AST", "GLUCOSE" in the last 168 hours.  Invalid input(s): "LABALBU" No results found for: "CKTOTAL", "CKMB", "CKMBINDEX", "TROPONINI"  Lab Results  Component Value Date   CHOL 144 08/12/2017   CHOL 139 04/04/2017   CHOL 137 10/02/2016   Lab Results  Component Value Date   HDL 30 (L) 08/12/2017   HDL 31 (L) 04/04/2017   HDL 31 (L) 10/02/2016   Lab Results  Component Value Date   LDLCALC 91 08/12/2017   LDLCALC 88 04/04/2017   LDLCALC 86 10/02/2016   Lab Results  Component Value Date   TRIG 117 08/12/2017   TRIG 98 04/04/2017   TRIG 101 10/02/2016   Lab Results  Component Value Date   CHOLHDL 4.8 08/12/2017   CHOLHDL 4.5 04/04/2017   CHOLHDL 4.4 10/02/2016   No results found for: "LDLDIRECT"    Radiology: No results found.  EKG: SR rate 59 normal 11/08/2021 SR rate 59 normal     ASSESSMENT AND PLAN:   Chest Pain:  likely from severe AS vs CAD will need right and left cath will arrange after getting echo to assess his AS.  AS:  update echo likely progression to severe given symptoms He understands that after heart cath he will likely need surgery for severe AS  HTN:  continue current meds including HCTZ/ACE and Torprol HLD:  continue statin labs  with primary    TTE Then likely labs and heart cath   F/U post cath   Signed: [name] 11/07/2021, 7:28 AM

## 2021-11-08 ENCOUNTER — Ambulatory Visit: Payer: Medicare Other | Admitting: Cardiovascular Disease

## 2021-11-08 ENCOUNTER — Encounter: Payer: Self-pay | Admitting: Cardiovascular Disease

## 2021-11-08 VITALS — BP 110/80 | HR 59 | Ht 71.0 in | Wt 297.6 lb

## 2021-11-08 DIAGNOSIS — I209 Angina pectoris, unspecified: Secondary | ICD-10-CM

## 2021-11-08 DIAGNOSIS — I10 Essential (primary) hypertension: Secondary | ICD-10-CM

## 2021-11-08 DIAGNOSIS — E782 Mixed hyperlipidemia: Secondary | ICD-10-CM

## 2021-11-08 DIAGNOSIS — I350 Nonrheumatic aortic (valve) stenosis: Secondary | ICD-10-CM | POA: Diagnosis not present

## 2021-11-08 NOTE — Patient Instructions (Signed)
Medication Instructions:  Your physician recommends that you continue on your current medications as directed. Please refer to the Current Medication list given to you today.  *If you need a refill on your cardiac medications before your next appointment, please call your pharmacy*  Lab Work: If you have labs (blood work) drawn today and your tests are completely normal, you will receive your results only by: MyChart Message (if you have MyChart) OR A paper copy in the mail If you have any lab test that is abnormal or we need to change your treatment, we will call you to review the results.  Testing/Procedures: Your physician has requested that you have an echocardiogram. Echocardiography is a painless test that uses sound waves to create images of your heart. It provides your doctor with information about the size and shape of your heart and how well your heart's chambers and valves are working. This procedure takes approximately one hour. There are no restrictions for this procedure.  Follow-Up: At [facility], you and your health needs are our priority.  As part of our continuing mission to provide you with exceptional heart care, we have created designated Provider Care Teams.  These Care Teams include your primary Cardiologist (physician) and Advanced Practice Providers (APPs -  Physician Assistants and Nurse Practitioners) who all work together to provide you with the care you need, when you need it.  We recommend signing up for the patient portal called "MyChart".  Sign up information is provided on this After Visit Summary.  MyChart is used to connect with patients for Virtual Visits (Telemedicine).  Patients are able to view lab/test results, encounter notes, upcoming appointments, etc.  Non-urgent messages can be sent to your provider as well.   To learn more about what you can do with MyChart, go to [email or web address].    Your next appointment:   We will call you to  schedule  The format for your next appointment:   In Person  Provider:   [name], MD {   Important Information About Sugar

## 2021-11-21 ENCOUNTER — Telehealth: Payer: Self-pay

## 2021-11-21 ENCOUNTER — Ambulatory Visit (HOSPITAL_COMMUNITY): Payer: Medicare Other | Attending: Cardiovascular Disease

## 2021-11-21 DIAGNOSIS — I350 Nonrheumatic aortic (valve) stenosis: Secondary | ICD-10-CM | POA: Diagnosis not present

## 2021-11-21 DIAGNOSIS — Z01812 Encounter for preprocedural laboratory examination: Secondary | ICD-10-CM

## 2021-11-21 LAB — ECHOCARDIOGRAM COMPLETE
AR max vel: 1.12 cm2
AV Area VTI: 1.07 cm2
AV Area mean vel: 1.08 cm2
AV Mean grad: 44 mmHg
AV Peak grad: 79.9 mmHg
Ao pk vel: 4.47 m/s
Area-P 1/2: 3.27 cm2
Calc EF: 63 %
S' Lateral: 3.1 cm
Single Plane A2C EF: 64.6 %
Single Plane A4C EF: 59.7 %

## 2021-11-21 MED ORDER — PERFLUTREN LIPID MICROSPHERE
1.0000 mL | INTRAVENOUS | Status: AC | PRN
  Administered 2021-11-21: 2 mL via INTRAVENOUS

## 2021-11-21 NOTE — Telephone Encounter (Signed)
Called patient with results. Scheduled patient for heart cath. Sent instructions through [employer]. Tried to call patient back to schedule lab work, no answer. Will order lab work when patient calls back.

## 2021-11-21 NOTE — Telephone Encounter (Signed)
-----   Message from Wendall Stade, MD sent at 11/21/2021 10:05 AM EDT ----- Severe AS needs right / left cath with Richarda Blade did watchman on his wife

## 2021-11-21 NOTE — Telephone Encounter (Signed)
Called patient. Patient will come get lab work tomorrow.

## 2021-11-22 ENCOUNTER — Ambulatory Visit: Payer: Medicare Other | Attending: Cardiology

## 2021-11-22 DIAGNOSIS — Z01812 Encounter for preprocedural laboratory examination: Secondary | ICD-10-CM

## 2021-11-22 DIAGNOSIS — I350 Nonrheumatic aortic (valve) stenosis: Secondary | ICD-10-CM

## 2021-11-22 LAB — CBC WITH DIFFERENTIAL/PLATELET
Basophils Absolute: 0 10*3/uL (ref 0.0–0.2)
Basos: 1 %
EOS (ABSOLUTE): 0.2 10*3/uL (ref 0.0–0.4)
Eos: 4 %
Hematocrit: 43.4 % (ref 37.5–51.0)
Hemoglobin: 14.9 g/dL (ref 13.0–17.7)
Lymphocytes Absolute: 1.4 10*3/uL (ref 0.7–3.1)
Lymphs: 28 %
MCH: 31.1 pg (ref 26.6–33.0)
MCHC: 34.3 g/dL (ref 31.5–35.7)
MCV: 91 fL (ref 79–97)
Monocytes Absolute: 0.4 10*3/uL (ref 0.1–0.9)
Monocytes: 8 %
Neutrophils Absolute: 3.1 10*3/uL (ref 1.4–7.0)
Neutrophils: 59 %
Platelets: 164 10*3/uL (ref 150–450)
RBC: 4.79 x10E6/uL (ref 4.14–5.80)
RDW: 14.5 % (ref 11.6–15.4)
WBC: 5.2 10*3/uL (ref 3.4–10.8)

## 2021-11-22 LAB — BASIC METABOLIC PANEL
BUN/Creatinine Ratio: 11 (ref 10–24)
BUN: 10 mg/dL (ref 8–27)
CO2: 33 mmol/L — ABNORMAL HIGH (ref 20–29)
Calcium: 9.9 mg/dL (ref 8.6–10.2)
Chloride: 102 mmol/L (ref 96–106)
Creatinine, Ser: 0.88 mg/dL (ref 0.76–1.27)
Glucose: 145 mg/dL — ABNORMAL HIGH (ref 70–99)
Potassium: 3.6 mmol/L (ref 3.5–5.2)
Sodium: 139 mmol/L (ref 134–144)
eGFR: 92 mL/min/{1.73_m2} (ref >59)

## 2021-11-28 ENCOUNTER — Telehealth: Payer: Self-pay | Admitting: *Deleted

## 2021-11-28 NOTE — Telephone Encounter (Addendum)
Cardiac Catheterization scheduled at [facility] for: Wednesday November 29, 2021 8:30 AM Arrival time and place: [facility] Main Entrance A at: 6:30 AM  Nothing to eat after midnight prior to procedure, clear liquids until 5 AM day of procedure.  Medication instructions: -Hold  Lisinopril/HCT-PM prior to procedure -Usual morning medications can be taken with sips of water including aspirin 81 mg.  Confirmed patient has responsible adult to drive home post procedure and be with patient first 24 hours after arriving home.  Patient reports no new symptoms concerning for COVID-19 in the past 10 days.  Reviewed procedure instructions with patient.

## 2021-11-29 ENCOUNTER — Ambulatory Visit (HOSPITAL_COMMUNITY)
Admission: RE | Disposition: A | Discharge status: Home / Self Care | Payer: Self-pay | Source: Home / Self Care | Attending: Cardiovascular Disease

## 2021-11-29 ENCOUNTER — Ambulatory Visit (HOSPITAL_COMMUNITY)
Admission: RE | Admit: 2021-11-29 | Discharge: 2021-11-29 | Disposition: A | Discharge status: Home / Self Care | Payer: Medicare Other | Attending: Cardiovascular Disease | Admitting: Cardiovascular Disease

## 2021-11-29 ENCOUNTER — Other Ambulatory Visit: Payer: Self-pay

## 2021-11-29 ENCOUNTER — Encounter (HOSPITAL_COMMUNITY): Payer: Self-pay | Admitting: Cardiovascular Disease

## 2021-11-29 DIAGNOSIS — I350 Nonrheumatic aortic (valve) stenosis: Secondary | ICD-10-CM

## 2021-11-29 DIAGNOSIS — I2510 Atherosclerotic heart disease of native coronary artery without angina pectoris: Secondary | ICD-10-CM

## 2021-11-29 DIAGNOSIS — I25119 Atherosclerotic heart disease of native coronary artery with unspecified angina pectoris: Secondary | ICD-10-CM | POA: Diagnosis not present

## 2021-11-29 DIAGNOSIS — I10 Essential (primary) hypertension: Secondary | ICD-10-CM | POA: Insufficient documentation

## 2021-11-29 DIAGNOSIS — Z79899 Other long term (current) drug therapy: Secondary | ICD-10-CM | POA: Insufficient documentation

## 2021-11-29 DIAGNOSIS — Z87891 Personal history of nicotine dependence: Secondary | ICD-10-CM | POA: Insufficient documentation

## 2021-11-29 DIAGNOSIS — E785 Hyperlipidemia, unspecified: Secondary | ICD-10-CM | POA: Diagnosis not present

## 2021-11-29 DIAGNOSIS — I2584 Coronary atherosclerosis due to calcified coronary lesion: Secondary | ICD-10-CM | POA: Diagnosis not present

## 2021-11-29 HISTORY — PX: RIGHT/LEFT HEART CATH AND CORONARY ANGIOGRAPHY: CATH118266

## 2021-11-29 LAB — POCT I-STAT 7, (LYTES, BLD GAS, ICA,H+H)
Acid-Base Excess: 0 mmol/L (ref 0.0–2.0)
Bicarbonate: 25.9 mmol/L (ref 20.0–28.0)
Calcium, Ion: 1.22 mmol/L (ref 1.15–1.40)
HCT: 40 % (ref 39.0–52.0)
Hemoglobin: 13.6 g/dL (ref 13.0–17.0)
O2 Saturation: 93 %
Potassium: 3.7 mmol/L (ref 3.5–5.1)
Sodium: 141 mmol/L (ref 135–145)
TCO2: 27 mmol/L (ref 22–32)
pCO2 arterial: 43.9 mmHg (ref 32–48)
pH, Arterial: 7.378 (ref 7.35–7.45)
pO2, Arterial: 69 mmHg — ABNORMAL LOW (ref 83–108)

## 2021-11-29 LAB — POCT I-STAT EG7
Acid-Base Excess: 2 mmol/L (ref 0.0–2.0)
Bicarbonate: 28.1 mmol/L — ABNORMAL HIGH (ref 20.0–28.0)
Calcium, Ion: 1.22 mmol/L (ref 1.15–1.40)
HCT: 40 % (ref 39.0–52.0)
Hemoglobin: 13.6 g/dL (ref 13.0–17.0)
O2 Saturation: 65 %
Potassium: 3.7 mmol/L (ref 3.5–5.1)
Sodium: 141 mmol/L (ref 135–145)
TCO2: 30 mmol/L (ref 22–32)
pCO2, Ven: 50.3 mmHg (ref 44–60)
pH, Ven: 7.355 (ref 7.25–7.43)
pO2, Ven: 36 mmHg (ref 32–45)

## 2021-11-29 SURGERY — RIGHT/LEFT HEART CATH AND CORONARY ANGIOGRAPHY
Anesthesia: LOCAL

## 2021-11-29 MED ORDER — SODIUM CHLORIDE 0.9% FLUSH: 3.0000 mL | INTRAVENOUS | Status: DC | PRN

## 2021-11-29 MED ORDER — VERAPAMIL HCL 2.5 MG/ML IV SOLN
INTRAVENOUS | Status: AC
  Filled 2021-11-29: qty 2

## 2021-11-29 MED ORDER — MIDAZOLAM HCL 2 MG/2ML IJ SOLN
INTRAMUSCULAR | Status: AC
  Filled 2021-11-29: qty 2

## 2021-11-29 MED ORDER — SODIUM CHLORIDE 0.9 % WEIGHT BASED INFUSION
3.0000 mL/kg/h | INTRAVENOUS | Status: AC
  Administered 2021-11-29: 3 mL/kg/h via INTRAVENOUS

## 2021-11-29 MED ORDER — SODIUM CHLORIDE 0.9 % WEIGHT BASED INFUSION: 1.0000 mL/kg/h | INTRAVENOUS | Status: DC

## 2021-11-29 MED ORDER — ASPIRIN 81 MG PO CHEW
81.0000 mg | CHEWABLE_TABLET | ORAL | Status: AC
  Administered 2021-11-29: 81 mg via ORAL
  Filled 2021-11-29: qty 1

## 2021-11-29 MED ORDER — LIDOCAINE HCL (PF) 1 % IJ SOLN
INTRAMUSCULAR | Status: AC
Start: 2021-11-29 — End: ?
  Filled 2021-11-29: qty 30

## 2021-11-29 MED ORDER — SODIUM CHLORIDE 0.9% FLUSH: 3.0000 mL | Freq: Two times a day (BID) | INTRAVENOUS | Status: DC

## 2021-11-29 MED ORDER — ACETAMINOPHEN 325 MG PO TABS: 650.0000 mg | ORAL_TABLET | ORAL | Status: DC | PRN

## 2021-11-29 MED ORDER — SODIUM CHLORIDE 0.9 % IV SOLN: 250.0000 mL | INTRAVENOUS | Status: DC | PRN

## 2021-11-29 MED ORDER — VERAPAMIL HCL 2.5 MG/ML IV SOLN: INTRAVENOUS | Status: DC | PRN

## 2021-11-29 MED ORDER — HEPARIN SODIUM (PORCINE) 1000 UNIT/ML IJ SOLN
INTRAMUSCULAR | Status: DC | PRN
  Administered 2021-11-29: 6000 [IU] via INTRAVENOUS

## 2021-11-29 MED ORDER — IOHEXOL 350 MG/ML SOLN
INTRAVENOUS | Status: DC | PRN
  Administered 2021-11-29: 115 mL

## 2021-11-29 MED ORDER — LABETALOL HCL 5 MG/ML IV SOLN: 10.0000 mg | INTRAVENOUS | Status: DC | PRN

## 2021-11-29 MED ORDER — HEPARIN (PORCINE) IN NACL 1000-0.9 UT/500ML-% IV SOLN
INTRAVENOUS | Status: AC
  Filled 2021-11-29: qty 1000

## 2021-11-29 MED ORDER — VERAPAMIL HCL 2.5 MG/ML IV SOLN
INTRAVENOUS | Status: AC
Start: 2021-11-29 — End: ?
  Filled 2021-11-29: qty 2

## 2021-11-29 MED ORDER — HEPARIN (PORCINE) IN NACL 1000-0.9 UT/500ML-% IV SOLN
INTRAVENOUS | Status: DC | PRN
  Administered 2021-11-29 (×2): 500 mL

## 2021-11-29 MED ORDER — FENTANYL CITRATE (PF) 100 MCG/2ML IJ SOLN
INTRAMUSCULAR | Status: DC | PRN
  Administered 2021-11-29 (×2): 25 ug via INTRAVENOUS

## 2021-11-29 MED ORDER — MIDAZOLAM HCL 2 MG/2ML IJ SOLN
INTRAMUSCULAR | Status: DC | PRN
  Administered 2021-11-29 (×2): 1 mg via INTRAVENOUS

## 2021-11-29 MED ORDER — HYDRALAZINE HCL 20 MG/ML IJ SOLN: 10.0000 mg | INTRAMUSCULAR | Status: DC | PRN

## 2021-11-29 MED ORDER — ONDANSETRON HCL 4 MG/2ML IJ SOLN: 4.0000 mg | Freq: Four times a day (QID) | INTRAMUSCULAR | Status: DC | PRN

## 2021-11-29 MED ORDER — HEPARIN SODIUM (PORCINE) 1000 UNIT/ML IJ SOLN
INTRAMUSCULAR | Status: AC
  Filled 2021-11-29: qty 10

## 2021-11-29 MED ORDER — FENTANYL CITRATE (PF) 100 MCG/2ML IJ SOLN
INTRAMUSCULAR | Status: AC
  Filled 2021-11-29: qty 2

## 2021-11-29 MED ORDER — LIDOCAINE HCL (PF) 1 % IJ SOLN
INTRAMUSCULAR | Status: DC | PRN
  Administered 2021-11-29 (×2): 2 mL via INTRADERMAL

## 2021-11-29 SURGICAL SUPPLY — 16 items
BAND CMPR LRG ZPHR (HEMOSTASIS) ×1
BAND ZEPHYR COMPRESS 30 LONG (HEMOSTASIS) IMPLANT
CATH 5FR JL3.5 JR4 ANG PIG MP (CATHETERS) IMPLANT
CATH BALLN WEDGE 5F 110CM (CATHETERS) IMPLANT
CATH INFINITI 5 FR AR1 MOD (CATHETERS) IMPLANT
CATH INFINITI 5 FR JR5 (CATHETERS) IMPLANT
CATH INFINITI 5 FR RCB (CATHETERS) IMPLANT
CATH INFINITI 5FR AL1 (CATHETERS) IMPLANT
GLIDESHEATH SLEND SS 6F .021 (SHEATH) IMPLANT
GUIDEWIRE INQWIRE 1.5J.035X260 (WIRE) IMPLANT
INQWIRE 1.5J .035X260CM (WIRE) ×1
KIT HEART LEFT (KITS) ×1 IMPLANT
PACK CARDIAC CATHETERIZATION (CUSTOM PROCEDURE TRAY) ×1 IMPLANT
SHEATH GLIDE SLENDER 4/5FR (SHEATH) IMPLANT
TRANSDUCER W/STOPCOCK (MISCELLANEOUS) ×1 IMPLANT
TUBING CIL FLEX 10 FLL-RA (TUBING) ×1 IMPLANT

## 2021-11-29 NOTE — Interval H&P Note (Signed)
History and Physical Interval Note:  11/29/2021 8:06 AM  Malik [name]  has presented today for surgery, with the diagnosis of aortic stenosis.  The various methods of treatment have been discussed with the patient and family. After consideration of risks, benefits and other options for treatment, the patient has consented to  Procedure(s): RIGHT/LEFT HEART CATH AND CORONARY ANGIOGRAPHY (N/A) as a surgical intervention.  The patient's history has been reviewed, patient examined, no change in status, stable for surgery.  I have reviewed the patient's chart and labs.  Questions were answered to the patient's satisfaction.     Tonny Bollman

## 2021-11-29 NOTE — Discharge Instructions (Signed)

## 2021-11-29 NOTE — Progress Notes (Signed)
Patient and wife was given discharge instructions. Both verbalized understanding. 

## 2021-11-29 NOTE — Progress Notes (Signed)
TR BAND REMOVAL  LOCATION:    right radial  DEFLATED PER PROTOCOL:    Yes.    TIME BAND OFF / DRESSING APPLIED:    1145   SITE UPON ARRIVAL:    Level 0  SITE AFTER BAND REMOVAL:    Level 0  CIRCULATION SENSATION AND MOVEMENT:    Within Normal Limits   Yes.    COMMENTS:   No sign of bleeding noted

## 2021-11-30 MED FILL — Verapamil HCl IV Soln 2.5 MG/ML: INTRAVENOUS | Qty: 2 | Status: AC

## 2021-12-11 NOTE — Progress Notes (Signed)
Formatting of this note is different from the original.  [place] [facility]    Chief Complaint   Patient presents with    Cyst     Back of neck     HISTORY:  Dustin Zimmerman is a 71 y.o. man who presents for a lesion of concern.     Location: posterior neck   Duration:  about a week.   Associated symptoms: tenderness   Modifying factor: Lesion has been present for about a year now, however over the past week it has increased in size and is also very tender. He has not had any associated drainage. Does not have similar lesion anywhere else on the body. No fever, chills and or diaphoresis    PHYSICAL EXAMINATION:  Skin: Exam of the  posterior neck  . All areas are normal except for the following findings.   - On the posterior neck, there is a firm, dome-shaped, flesh-colored nodule, freely movable on palpation, with a small, dilated punctum.     ASSESSMENT AND PLAN:    Inflamed epidermoid cyst on the posterior neck   - The benign nature of epidermoid cysts was discussed with the patient, however given that it has become symptomatic will prescribe a 10 day course of doxycycline 100mg  twice daily and then refer to ENT for definitive surgical management.    - Recommended ILK injection however patient deferred and will consider that if no improvement after adhering to above treatment regimen.   - Advised to reach out to [place] if no improvement in 7-10 days time.     Follow-up:  PRN in clinic.      COMPLIANCE DOCUMENTATION:  - No sensitive sites were examined.     Dr. Hassel Neth was physically present for the evaluation of the patient      Electronically signed by Luanna Cole, MD at 12/11/2021  2:46 PM EDT

## 2021-12-14 ENCOUNTER — Ambulatory Visit (HOSPITAL_COMMUNITY)
Admission: RE | Admit: 2021-12-14 | Discharge: 2021-12-14 | Disposition: A | Discharge status: Home / Self Care | Payer: Medicare Other | Source: Ambulatory Visit | Attending: Cardiovascular Disease | Admitting: Cardiovascular Disease

## 2021-12-14 DIAGNOSIS — I350 Nonrheumatic aortic (valve) stenosis: Secondary | ICD-10-CM | POA: Insufficient documentation

## 2021-12-14 DIAGNOSIS — K760 Fatty (change of) liver, not elsewhere classified: Secondary | ICD-10-CM | POA: Diagnosis not present

## 2021-12-14 DIAGNOSIS — J439 Emphysema, unspecified: Secondary | ICD-10-CM | POA: Diagnosis not present

## 2021-12-14 MED ORDER — IOHEXOL 350 MG/ML SOLN
100.0000 mL | Freq: Once | INTRAVENOUS | Status: AC | PRN
  Administered 2021-12-14: 100 mL via INTRAVENOUS

## 2021-12-14 MED ORDER — IOHEXOL 350 MG/ML SOLN: 100.0000 mL | Freq: Once | INTRAVENOUS | Status: DC | PRN

## 2021-12-14 NOTE — Progress Notes (Signed)
  [facility]   MULTIDISCIPLINARY HEART VALVE TEAM  Addend:  STS risk calculation Isolated AVR: Operative mortality: 1.12% Morbidity and Mortality: 5.94% Stroke: 0.634% Renal Failure: 0.899% Reoperation: 3.24% Prolonged Ventilation: 2.54% Deep Sternal Wound Infection: 0.074% [facility] Hospital Stay (>14 days): 2.32% Short Hospital Stay (<6 days): 53.6%  Kathyrn Drown NP-C Structural Heart Team  Pager: [phone] Phone: [phone]

## 2021-12-15 ENCOUNTER — Encounter: Payer: Self-pay | Admitting: Cardiovascular Disease

## 2022-01-07 NOTE — Progress Notes (Unsigned)
[address]       [place] [address]             [id number]           [name] Medical Record #[id number] Date of Birth: [birth year]  Malik Stade, MD Malik Prince, MD  Chief Complaint:   No chief complaint on file.   History of Present Illness:        Current Activity/ Functional Status:    Past Medical History:  Diagnosis Date   Aortic stenosis    Arthritis    ASHD (arteriosclerotic heart disease)    Chest pressure    COVID-19 virus infection 11/2019   GERD (gastroesophageal reflux disease)    Heart murmur    History of kidney stones    Hyperglycemia    Hyperlipidemia    Hypertension    Nephrolithiasis    Obesity     Past Surgical History:  Procedure Laterality Date   APPENDECTOMY     CIRCUMCISION N/A 11/03/2019   Procedure: CIRCUMCISION ADULT;  Surgeon: Bjorn Pippin, MD;  Location: [facility];  Service: Urology;  Laterality: N/A;   RIGHT/LEFT HEART CATH AND CORONARY ANGIOGRAPHY N/A 11/29/2021   Procedure: RIGHT/LEFT HEART CATH AND CORONARY ANGIOGRAPHY;  Surgeon: Tonny Bollman, MD;  Location: [facility] INVASIVE CV LAB;  Service: Cardiovascular;  Laterality: N/A;    Social History   Tobacco Use  Smoking Status Former   Years: 20.00   Types: Cigarettes, Cigars   Quit date: 08/19/1978   Years since quitting: 43.4  Smokeless Tobacco Former    Social History   Substance and Sexual Activity  Alcohol Use No    Social History   Socioeconomic History   Marital status: Divorced    Spouse name: Not on file   Number of children: 1   Years of education: 12   Highest education level: Not on file  Occupational History   Not on file  Tobacco Use   Smoking status: Former    Years: 20.00    Types: Cigarettes, Cigars    Quit date: 08/19/1978    Years since quitting: 43.4   Smokeless tobacco: Former  Building services engineer Use: Never used  Substance and Sexual Activity   Alcohol use: No   Drug use: No   Sexual  activity: Not on file  Other Topics Concern   Not on file  Social History Narrative   Not on file   Social Determinants of Health   Financial Resource Strain: Not on file  Food Insecurity: Not on file  Transportation Needs: Not on file  Physical Activity: Not on file  Stress: Not on file  Social Connections: Not on file  Intimate Partner Violence: Not on file    No Known Allergies  Current Outpatient Medications  Medication Sig Dispense Refill   aspirin 325 MG EC tablet Take 325 mg by mouth at bedtime.      latanoprost (XALATAN) 0.005 % ophthalmic solution Place 1 drop into both eyes at bedtime.     lisinopril-hydrochlorothiazide (PRINZIDE,ZESTORETIC) 20-25 MG tablet Take 1 tablet by mouth daily. (Patient taking differently: Take 1 tablet by mouth at bedtime.) 90 tablet 1   metoprolol succinate (TOPROL-XL) 100 MG 24 hr tablet Take 1 tablet (100 mg total) by mouth daily. Take with or immediately following a meal. (Patient taking differently: Take 100 mg by mouth at bedtime. Take with or immediately following a meal.) 90 tablet 1  simvastatin (ZOCOR) 80 MG tablet TAKE 1 TABLET(80 MG) BY MOUTH AT BEDTIME (Patient taking differently: Take 80 mg by mouth at bedtime.) 90 tablet 1   No current facility-administered medications for this visit.     Family History  Problem Relation Age of Onset   Cancer Mother    Heart disease Father    Heart disease Brother    Cancer Brother        throat cancer       Physical Exam: There were no vitals taken for this visit.     Diagnostic Studies & Laboratory data:I have personally reviewed the studies and agree with the results  TTE 10/2021   IMPRESSIONS   1. Left ventricular ejection fraction, by estimation, is 60 to 65%. The  left ventricle has normal function. The left ventricle has no regional  wall motion abnormalities. Left ventricular diastolic parameters were  normal.   2. Right ventricular systolic function is normal. The  right ventricular  size is normal.   3. Left atrial size was moderately dilated.   4. The mitral valve is normal in structure. No evidence of mitral valve  regurgitation. No evidence of mitral stenosis.   5. The aortic valve is tricuspid. There is severe calcifcation of the  aortic valve. There is moderate thickening of the aortic valve. Aortic  valve regurgitation is not visualized. Severe aortic valve stenosis.   6. Aortic dilatation noted. There is mild dilatation of the aortic root,  measuring 39 mm. There is moderate dilatation of the ascending aorta,  measuring 43 mm.   7. The inferior vena cava is normal in size with greater than 50%  respiratory variability, suggesting right atrial pressure of 3 mmHg.   FINDINGS   Left Ventricle: Left ventricular ejection fraction, by estimation, is 60  to 65%. The left ventricle has normal function. The left ventricle has no  regional wall motion abnormalities. Definity contrast agent was given IV  to delineate the left ventricular   endocardial borders. The left ventricular internal cavity size was normal  in size. There is no left ventricular hypertrophy. Left ventricular  diastolic parameters were normal.   Right Ventricle: The right ventricular size is normal. No increase in  right ventricular wall thickness. Right ventricular systolic function is  normal.   Left Atrium: Left atrial size was moderately dilated.   Right Atrium: Right atrial size was normal in size.   Pericardium: There is no evidence of pericardial effusion.   Mitral Valve: The mitral valve is normal in structure. No evidence of  mitral valve regurgitation. No evidence of mitral valve stenosis.   Tricuspid Valve: The tricuspid valve is normal in structure. Tricuspid  valve regurgitation is not demonstrated. No evidence of tricuspid  stenosis.   Aortic Valve: The aortic valve is tricuspid. There is severe calcifcation  of the aortic valve. There is moderate  thickening of the aortic valve.  Aortic valve regurgitation is not visualized. Severe aortic stenosis is  present. Aortic valve mean gradient  measures 44.0 mmHg. Aortic valve peak gradient measures 79.9 mmHg. Aortic  valve area, by VTI measures 1.07 cm.   Pulmonic Valve: The pulmonic valve was normal in structure. Pulmonic valve  regurgitation is not visualized. No evidence of pulmonic stenosis.   Aorta: Aortic dilatation noted. There is mild dilatation of the aortic  root, measuring 39 mm. There is moderate dilatation of the ascending  aorta, measuring 43 mm.   Venous: The inferior vena cava is normal in size  with greater than 50%  respiratory variability, suggesting right atrial pressure of 3 mmHg.   IAS/Shunts: No atrial level shunt detected by color flow Doppler.      LEFT VENTRICLE  PLAX 2D  LVIDd:         5.40 cm      Diastology  LVIDs:         3.10 cm      LV e' medial:    7.23 cm/s  LV PW:         1.10 cm      LV E/e' medial:  11.7  LV IVS:        1.10 cm      LV e' lateral:   9.30 cm/s  LVOT diam:     2.40 cm      LV E/e' lateral: 9.1  LV SV:         114  LV SV Index:   45  LVOT Area:     4.52 cm     LV Volumes (MOD)  LV vol d, MOD A2C: 76.0 ml  LV vol d, MOD A4C: 134.0 ml  LV vol s, MOD A2C: 26.9 ml  LV vol s, MOD A4C: 54.0 ml  LV SV MOD A2C:     49.1 ml  LV SV MOD A4C:     134.0 ml  LV SV MOD BP:      63.2 ml   CATH 10/2021 Conclusion      Ost RCA to Prox RCA lesion is 80% stenosed.   Prox LAD to Mid LAD lesion is 30% stenosed.   Prox Cx to Mid Cx lesion is 70% stenosed.   There is severe aortic valve stenosis.   1.  Severe single-vessel coronary artery disease with severe ostial stenosis of the RCA and left-to-right collaterals supplying the distal RCA branches which fill both antegrade and via collaterals 2.  Patent left main and LAD without significant stenosis 3.  Patent left circumflex into a large first OM and moderately severe stenosis of the AV  circumflex beyond the OM supplying a small myocardial territory 4.  Severe calcific aortic stenosis with mean transvalvular gradient 50 mmHg, peak to peak gradient 61 mmHg, calculated valve area 0.89 cm 5.  Normal right heart hemodynamics   Recommendations: Favor medical therapy for CAD, CT angiography studies for planning of aortic valve replacement via TAVR versus SAVR   CTA (11/2021) FINDINGS: Aortic Valve: Calcified tri leaflet with score 3469   Aorta: Mild aneurysmal dilatation normal arch vessels mild calcific atherosclerosis   Sino-tubular Junction: 34 mm   Ascending Thoracic Aorta: 39 mm   Aortic Arch: 29 mm   Descending Thoracic Aorta: 29 mm   Sinus of Valsalva Measurements:   Non-coronary: 33.7 mm   Right - coronary: 35.7 mm   Left -   coronary: 33.6 mm   Coronary Artery Height above Annulus:   Left Main: 17.6 mm above annulus   Right Coronary: 19.6 mm above annulus   Virtual Basal Annulus Measurements:   Maximum / Minimum Diameter: 29.4 mm x 27.7 mm   Perimeter: 88.2 mm   Area: 589 mm2   Coronary Arteries: Sufficient height above annulus for deployment   Optimum Fluoroscopic Angle for Delivery: LAO 5 Caudal 5 degrees   IMPRESSION: 1.  Tri leaflet AV with calcium score 3469   2. Annular area of 589 mm2 suitable for a 29 mm Sapien 3 Ultra valve   3.  Coronary arteries sufficient height above  annulus for deployment   4.  Optimum angiographic angle for deployment LAO 5 Caudal 5 degrees   5.  Membranous septal length 8.4 mm  VR   Recent Radiology Findings:   No results found.    Recent Lab Findings: Lab Results  Component Value Date   WBC 5.2 11/22/2021   HGB 13.6 11/29/2021   HCT 40.0 11/29/2021   PLT 164 11/22/2021   GLUCOSE 145 (H) 11/22/2021   CHOL 144 08/12/2017   TRIG 117 08/12/2017   HDL 30 (L) 08/12/2017   LDLCALC 91 08/12/2017   ALT 30 08/12/2017   AST 22 08/12/2017   NA 141 11/29/2021   K 3.7 11/29/2021   CL 102  11/22/2021   CREATININE 0.88 11/22/2021   BUN 10 11/22/2021   CO2 33 (H) 11/22/2021   TSH 1.521 05/13/2014   HGBA1C 6.4 (H) 04/04/2017      Assessment / Plan:            Malik [name] 01/07/2022 6:42 PM

## 2022-01-08 ENCOUNTER — Encounter: Payer: Self-pay | Admitting: Thoracic Surgery (Cardiothoracic Vascular Surgery)

## 2022-01-08 ENCOUNTER — Other Ambulatory Visit: Payer: Self-pay | Admitting: *Deleted

## 2022-01-08 ENCOUNTER — Institutional Professional Consult (permissible substitution) (INDEPENDENT_AMBULATORY_CARE_PROVIDER_SITE_OTHER): Payer: Medicare Other | Admitting: Thoracic Surgery (Cardiothoracic Vascular Surgery)

## 2022-01-08 VITALS — BP 128/56 | HR 68 | Resp 18 | Ht 72.0 in

## 2022-01-08 DIAGNOSIS — I350 Nonrheumatic aortic (valve) stenosis: Secondary | ICD-10-CM

## 2022-01-08 DIAGNOSIS — I2510 Atherosclerotic heart disease of native coronary artery without angina pectoris: Secondary | ICD-10-CM

## 2022-01-08 NOTE — Progress Notes (Signed)
Pre Surgical Assessment: 5 M Walk Test  11M=16.84ft  5 Meter Walk Test- trial 1: 5.13 seconds 5 Meter Walk Test- trial 2: 6.83 seconds 5 Meter Walk Test- trial 3: 6.25 seconds 5 Meter Walk Test Average: 6.07 seconds

## 2022-01-08 NOTE — Patient Instructions (Signed)
Will be scheduled for AVR and CABG x 1 and will receive call for date

## 2022-01-09 ENCOUNTER — Encounter: Payer: Self-pay | Admitting: *Deleted

## 2022-01-18 NOTE — Progress Notes (Signed)
Surgical Instructions    Your procedure is scheduled on Tuesday October 31st.  Report to [facility] Main Entrance "A" at 5:30 A.M., then check in with the Admitting office.  Call this number if you have problems the morning of surgery:  [phone]   If you have any questions prior to your surgery date call [phone]: Open Monday-Friday 8am-4pm If you experience any cold or flu symptoms such as cough, fever, chills, shortness of breath, etc. between now and your scheduled surgery, please notify us at the above number     Remember:  Do not eat or drink after midnight the night before your surgery     Take these medicines the morning of surgery with A SIP OF WATER: metoprolol succinate (TOPROL-XL) 100 MG 24 hr tablet    Follow your surgeon's instructions on when to stop Aspirin.  If no instructions were given by your surgeon then you will need to call the office to get those instructions.     As of today, STOP taking any Aspirin (unless otherwise instructed by your surgeon) Aleve, Naproxen, Ibuprofen, Motrin, Advil, Goody's, BC's, all herbal medications, fish oil, and all vitamins.           Do not wear jewelry  Do not wear lotions, powders, cologne or deodorant. Do not shave 48 hours prior to surgery.  Men may shave face and neck. Do not bring valuables to the hospital. Do not wear nail polish  [facility] is not responsible for any belongings or valuables.    Do NOT Smoke (Tobacco/Vaping)  24 hours prior to your procedure  If you use a CPAP at night, you may bring your mask for your overnight stay.   Contacts, glasses, hearing aids, dentures or partials may not be worn into surgery, please bring cases for these belongings   For patients admitted to the hospital, discharge time will be determined by your treatment team.   Patients discharged the day of surgery will not be allowed to drive home, and someone needs to stay with them for 24 hours.   SURGICAL WAITING ROOM  VISITATION Patients having surgery or a procedure may have no more than 2 support people in the waiting area - these visitors may rotate.   Children under the age of 78 must have an adult with them who is not the patient. If the patient needs to stay at the hospital during part of their recovery, the visitor guidelines for inpatient rooms apply. Pre-op nurse will coordinate an appropriate time for 1 support person to accompany patient in pre-op.  This support person may not rotate.   Please refer to [email or web address] for the visitor guidelines for Inpatients (after your surgery is over and you are in a regular room).    Special instructions:    Oral Hygiene is also important to reduce your risk of infection.  Remember - BRUSH YOUR TEETH THE MORNING OF SURGERY WITH YOUR REGULAR TOOTHPASTE   [facility]- Preparing For Surgery  Before surgery, you can play an important role. Because skin is not sterile, your skin needs to be as free of germs as possible. You can reduce the number of germs on your skin by washing with CHG (chlorahexidine gluconate) Soap before surgery.  CHG is an antiseptic cleaner which kills germs and bonds with the skin to continue killing germs even after washing.     Please do not use if you have an allergy to CHG or antibacterial soaps. If your skin becomes reddened/irritated stop  using the CHG.  Do not shave (including legs and underarms) for at least 48 hours prior to first CHG shower. It is OK to shave your face.  Please follow these instructions carefully.     Shower the NIGHT BEFORE SURGERY and the MORNING OF SURGERY with CHG Soap.   If you chose to wash your hair, wash your hair first as usual with your normal shampoo. After you shampoo, rinse your hair and body thoroughly to remove the shampoo.  Then [employer] and genitals (private parts) with your normal soap and rinse thoroughly to remove soap.  After that Use  CHG Soap as you would any other liquid soap. You can apply CHG directly to the skin and wash gently with a scrungie or a clean washcloth.   Apply the CHG Soap to your body ONLY FROM THE NECK DOWN.  Do not use on open wounds or open sores. Avoid contact with your eyes, ears, mouth and genitals (private parts). Wash Face and genitals (private parts)  with your normal soap.   Wash thoroughly, paying special attention to the area where your surgery will be performed.  Thoroughly rinse your body with warm water from the neck down.  DO NOT shower/wash with your normal soap after using and rinsing off the CHG Soap.  Pat yourself dry with a CLEAN TOWEL.  Wear CLEAN PAJAMAS to bed the night before surgery  Place CLEAN SHEETS on your bed the night before your surgery  DO NOT SLEEP WITH PETS.   Day of Surgery:  Take a shower with CHG soap. Wear Clean/Comfortable clothing the morning of surgery Do not apply any deodorants/lotions.   Remember to brush your teeth WITH YOUR REGULAR TOOTHPASTE.    If you received a COVID test during your pre-op visit, it is requested that you wear a mask when out in public, stay away from anyone that may not be feeling well, and notify your surgeon if you develop symptoms. If you have been in contact with anyone that has tested positive in the last 10 days, please notify your surgeon.    Please read over the following fact sheets that you were given.

## 2022-01-19 ENCOUNTER — Ambulatory Visit (HOSPITAL_COMMUNITY)
Admission: RE | Admit: 2022-01-19 | Discharge: 2022-01-19 | Disposition: A | Discharge status: Home / Self Care | Payer: Medicare Other | Source: Ambulatory Visit | Attending: Thoracic Surgery (Cardiothoracic Vascular Surgery) | Admitting: Thoracic Surgery (Cardiothoracic Vascular Surgery)

## 2022-01-19 ENCOUNTER — Encounter (HOSPITAL_COMMUNITY)
Admission: RE | Admit: 2022-01-19 | Discharge: 2022-01-19 | Disposition: A | Discharge status: Home / Self Care | Payer: Medicare Other | Source: Ambulatory Visit | Attending: Thoracic Surgery (Cardiothoracic Vascular Surgery) | Admitting: Thoracic Surgery (Cardiothoracic Vascular Surgery)

## 2022-01-19 ENCOUNTER — Other Ambulatory Visit: Payer: Self-pay

## 2022-01-19 ENCOUNTER — Encounter (HOSPITAL_COMMUNITY): Payer: Self-pay

## 2022-01-19 VITALS — BP 136/68 | HR 59 | Temp 97.7°F | Resp 18 | Ht 72.0 in | Wt 302.6 lb

## 2022-01-19 DIAGNOSIS — I2510 Atherosclerotic heart disease of native coronary artery without angina pectoris: Secondary | ICD-10-CM | POA: Insufficient documentation

## 2022-01-19 DIAGNOSIS — Z01818 Encounter for other preprocedural examination: Secondary | ICD-10-CM | POA: Insufficient documentation

## 2022-01-19 DIAGNOSIS — I350 Nonrheumatic aortic (valve) stenosis: Secondary | ICD-10-CM

## 2022-01-19 DIAGNOSIS — Z1152 Encounter for screening for COVID-19: Secondary | ICD-10-CM | POA: Insufficient documentation

## 2022-01-19 DIAGNOSIS — I10 Essential (primary) hypertension: Secondary | ICD-10-CM | POA: Diagnosis not present

## 2022-01-19 DIAGNOSIS — E785 Hyperlipidemia, unspecified: Secondary | ICD-10-CM | POA: Insufficient documentation

## 2022-01-19 DIAGNOSIS — Z87820 Personal history of traumatic brain injury: Secondary | ICD-10-CM | POA: Diagnosis not present

## 2022-01-19 LAB — BLOOD GAS, ARTERIAL
Acid-Base Excess: 3.2 mmol/L — ABNORMAL HIGH (ref 0.0–2.0)
Bicarbonate: 26.9 mmol/L (ref 20.0–28.0)
Drawn by: 6643
O2 Saturation: 99 %
Patient temperature: 37
pCO2 arterial: 37 mmHg (ref 32–48)
pH, Arterial: 7.47 — ABNORMAL HIGH (ref 7.35–7.45)
pO2, Arterial: 113 mmHg — ABNORMAL HIGH (ref 83–108)

## 2022-01-19 LAB — COMPREHENSIVE METABOLIC PANEL
ALT: 24 U/L (ref 0–44)
AST: 18 U/L (ref 15–41)
Albumin: 3.8 g/dL (ref 3.5–5.0)
Alkaline Phosphatase: 53 U/L (ref 38–126)
Anion gap: 8 (ref 5–15)
BUN: 12 mg/dL (ref 8–23)
CO2: 23 mmol/L (ref 22–32)
Calcium: 9.3 mg/dL (ref 8.9–10.3)
Chloride: 108 mmol/L (ref 98–111)
Creatinine, Ser: 0.78 mg/dL (ref 0.61–1.24)
GFR, Estimated: >60 mL/min (ref >60)
Glucose, Bld: 93 mg/dL (ref 70–99)
Potassium: 3.8 mmol/L (ref 3.5–5.1)
Sodium: 139 mmol/L (ref 135–145)
Total Bilirubin: 0.9 mg/dL (ref 0.3–1.2)
Total Protein: 6.7 g/dL (ref 6.5–8.1)

## 2022-01-19 LAB — CBC
HCT: 44.7 % (ref 39.0–52.0)
Hemoglobin: 15.3 g/dL (ref 13.0–17.0)
MCH: 31.3 pg (ref 26.0–34.0)
MCHC: 34.2 g/dL (ref 30.0–36.0)
MCV: 91.4 fL (ref 80.0–100.0)
Platelets: 163 10*3/uL (ref 150–400)
RBC: 4.89 MIL/uL (ref 4.22–5.81)
RDW: 13.2 % (ref 11.5–15.5)
WBC: 6.9 10*3/uL (ref 4.0–10.5)
nRBC: 0 % (ref 0.0–0.2)

## 2022-01-19 LAB — TYPE AND SCREEN
ABO/RH(D): B POS
Antibody Screen: NEGATIVE

## 2022-01-19 LAB — URINALYSIS, ROUTINE W REFLEX MICROSCOPIC
Bilirubin Urine: NEGATIVE
Glucose, UA: NEGATIVE mg/dL
Hgb urine dipstick: NEGATIVE
Ketones, ur: NEGATIVE mg/dL
Leukocytes,Ua: NEGATIVE
Nitrite: NEGATIVE
Protein, ur: NEGATIVE mg/dL
Specific Gravity, Urine: 1.015 (ref 1.005–1.030)
pH: 6 (ref 5.0–8.0)

## 2022-01-19 LAB — HEMOGLOBIN A1C
Hgb A1c MFr Bld: 5.9 % — ABNORMAL HIGH (ref 4.8–5.6)
Mean Plasma Glucose: 122.63 mg/dL

## 2022-01-19 LAB — SARS CORONAVIRUS 2 (TAT 6-24 HRS): SARS Coronavirus 2: NEGATIVE

## 2022-01-19 LAB — SURGICAL PCR SCREEN
MRSA, PCR: NEGATIVE
Staphylococcus aureus: NEGATIVE

## 2022-01-19 LAB — PROTIME-INR
INR: 1.1 (ref 0.8–1.2)
Prothrombin Time: 13.8 seconds (ref 11.4–15.2)

## 2022-01-19 LAB — APTT: aPTT: 27 seconds (ref 24–36)

## 2022-01-19 NOTE — Progress Notes (Signed)
PCP - Reynold Bowen, MD Cardiologist - [facility] [place]  PPM/ICD - Denies Device Orders -  Rep Notified -   Chest x-ray - 01/19/22 EKG - 01/19/22 Stress Test - Yes in the past not in a while ECHO - 01/08/22 Cardiac Cath - 11/29/21  Sleep Study - Yes no OSA CPAP -   DM - Denies  Blood Thinner Instructions: Denies Aspirin Instructions: Requested patient to ask surgeon's office when to stop  COVID TEST- 01/19/22   Anesthesia review: Yes cardiac history  Patient denies shortness of breath, fever, cough and chest pain at PAT appointment   All instructions explained to the patient, with a verbal understanding of the material. Patient agrees to go over the instructions while at home for a better understanding. Patient also instructed to wear a mask while in public after being tested for COVID-19. The opportunity to ask questions was provided.

## 2022-01-19 NOTE — Progress Notes (Signed)
   01/19/22 1134  OBSTRUCTIVE SLEEP APNEA  Have you ever been diagnosed with sleep apnea through a sleep study? No  Do you snore loudly (loud enough to be heard through closed doors)?  1  Do you often feel tired, fatigued, or sleepy during the daytime (such as falling asleep during driving or talking to someone)? 0  Has anyone observed you stop breathing during your sleep? 0  Do you have, or are you being treated for high blood pressure? 1  BMI more than 35 kg/m2? 1  Age > 50 (1-yes) 1  Neck circumference greater than:Male 16 inches or larger, Male 17inches or larger? 1  Male Gender (Yes=1) 1  Obstructive Sleep Apnea Score 6  Score 5 or greater  Results sent to PCP

## 2022-01-22 MED ORDER — CEFAZOLIN IN SODIUM CHLORIDE 3-0.9 GM/100ML-% IV SOLN
3.0000 g | INTRAVENOUS | Status: AC
  Administered 2022-01-23: 2 g via INTRAVENOUS
  Administered 2022-01-23: 3 g via INTRAVENOUS
  Filled 2022-01-22: qty 100

## 2022-01-22 MED ORDER — MANNITOL 20 % IV SOLN
INTRAVENOUS | Status: DC
  Filled 2022-01-22: qty 13

## 2022-01-22 MED ORDER — HEPARIN 30,000 UNITS/1000 ML (OHS) CELLSAVER SOLUTION
Status: DC
  Filled 2022-01-22: qty 1000

## 2022-01-22 MED ORDER — TRANEXAMIC ACID (OHS) PUMP PRIME SOLUTION
2.0000 mg/kg | INTRAVENOUS | Status: DC
  Filled 2022-01-22: qty 2.75

## 2022-01-22 MED ORDER — CEFAZOLIN SODIUM-DEXTROSE 2-4 GM/100ML-% IV SOLN
2.0000 g | INTRAVENOUS | Status: DC
  Filled 2022-01-22: qty 100

## 2022-01-22 MED ORDER — NOREPINEPHRINE 4 MG/250ML-% IV SOLN
0.0000 ug/min | INTRAVENOUS | Status: DC
  Filled 2022-01-22: qty 250

## 2022-01-22 MED ORDER — POTASSIUM CHLORIDE 2 MEQ/ML IV SOLN
80.0000 meq | INTRAVENOUS | Status: DC
  Filled 2022-01-22 (×2): qty 40

## 2022-01-22 MED ORDER — INSULIN REGULAR(HUMAN) IN NACL 100-0.9 UT/100ML-% IV SOLN
INTRAVENOUS | Status: AC
  Administered 2022-01-23: 1.4 [IU]/h via INTRAVENOUS
  Filled 2022-01-22: qty 100

## 2022-01-22 MED ORDER — TRANEXAMIC ACID (OHS) BOLUS VIA INFUSION
15.0000 mg/kg | INTRAVENOUS | Status: AC
  Administered 2022-01-23: 2059.5 mg via INTRAVENOUS
  Filled 2022-01-22: qty 2060

## 2022-01-22 MED ORDER — EPINEPHRINE HCL 5 MG/250ML IV SOLN IN NS
0.0000 ug/min | INTRAVENOUS | Status: DC
  Filled 2022-01-22: qty 250

## 2022-01-22 MED ORDER — TRANEXAMIC ACID 1000 MG/10ML IV SOLN
1.5000 mg/kg/h | INTRAVENOUS | Status: AC
  Administered 2022-01-23: 1.5 mg/kg/h via INTRAVENOUS
  Filled 2022-01-22: qty 25

## 2022-01-22 MED ORDER — DEXMEDETOMIDINE HCL IN NACL 400 MCG/100ML IV SOLN
0.1000 ug/kg/h | INTRAVENOUS | Status: AC
  Administered 2022-01-23: .2 ug/kg/h via INTRAVENOUS
  Filled 2022-01-22: qty 100

## 2022-01-22 MED ORDER — PHENYLEPHRINE HCL-NACL 20-0.9 MG/250ML-% IV SOLN
30.0000 ug/min | INTRAVENOUS | Status: AC
  Administered 2022-01-23: 25 ug/min via INTRAVENOUS
  Filled 2022-01-22: qty 250

## 2022-01-22 MED ORDER — MILRINONE LACTATE IN DEXTROSE 20-5 MG/100ML-% IV SOLN
0.3000 ug/kg/min | INTRAVENOUS | Status: DC
  Filled 2022-01-22: qty 100

## 2022-01-22 MED ORDER — PLASMA-LYTE A IV SOLN
INTRAVENOUS | Status: DC
  Filled 2022-01-22: qty 2.5

## 2022-01-22 MED ORDER — VANCOMYCIN HCL 1500 MG/300ML IV SOLN
1500.0000 mg | INTRAVENOUS | Status: AC
  Administered 2022-01-23: 1500 mg via INTRAVENOUS
  Filled 2022-01-22: qty 300

## 2022-01-22 MED ORDER — VANCOMYCIN HCL 1000 MG IV SOLR
INTRAVENOUS | Status: DC
  Filled 2022-01-22: qty 20

## 2022-01-22 NOTE — H&P (Signed)
[name] Medical Record #[id number] Date of Birth: 09-Sep-1950   Wendall Stade, MD Adrian Prince, MD   Chief Complaint:   CP and DOE   History of Present Illness:     Pt is a very pleasant 71 yo wm who has known AS from an original heart murmur work up about 10 yrs ago. Pt has had recently an increase in his symptoms of exertional CP and DOE that both resolve with rest for the past year or so. He feels somewhat worse the past several months. He has no lightheadedness nor syncope or palpitations. He was seen and underwent echo with now severe AS with a mean gradient of 44 mmHg and with a peak of . He has normal LV function with an EF of 65%. He underwent cath with a high grade 80% stenosis of the ostium of the RCA and also 70% of a distal AV groove OM. He underwent TAVR CTA which has adequate anatomy for TAVR.               Past Medical History:  Diagnosis Date   Aortic stenosis     Arthritis     ASHD (arteriosclerotic heart disease)     Chest pressure     COVID-19 virus infection 11/2019   GERD (gastroesophageal reflux disease)     Heart murmur     History of kidney stones     Hyperglycemia     Hyperlipidemia     Hypertension     Nephrolithiasis     Obesity             Past Surgical History:  Procedure Laterality Date   APPENDECTOMY       CIRCUMCISION N/A 11/03/2019    Procedure: CIRCUMCISION ADULT;  Surgeon: Bjorn Pippin, MD;  Location: [facility];  Service: Urology;  Laterality: N/A;   RIGHT/LEFT HEART CATH AND CORONARY ANGIOGRAPHY N/A 11/29/2021    Procedure: RIGHT/LEFT HEART CATH AND CORONARY ANGIOGRAPHY;  Surgeon: Tonny Bollman, MD;  Location: [facility] INVASIVE CV LAB;  Service: Cardiovascular;  Laterality: N/A;      Social History        Tobacco Use  Smoking Status Former   Years: 20.00   Types: Cigarettes, Cigars   Quit date: 08/19/1978   Years since quitting: 43.4  Smokeless Tobacco Former    Social History       Substance and Sexual  Activity  Alcohol Use No      Social History         Socioeconomic History   Marital status: Divorced      Spouse name: Not on file   Number of children: 1   Years of education: 12   Highest education level: Not on file  Occupational History   Not on file  Tobacco Use   Smoking status: Former      Years: 20.00      Types: Cigarettes, Cigars      Quit date: 08/19/1978      Years since quitting: 43.4   Smokeless tobacco: Former  Building services engineer Use: Never used  Substance and Sexual Activity   Alcohol use: No   Drug use: No   Sexual activity: Not on file  Other Topics Concern   Not on file  Social History Narrative   Not on file    Social Determinants of Health    Financial Resource Strain: Not on file  Food Insecurity: Not on file  Transportation Needs: Not  on file  Physical Activity: Not on file  Stress: Not on file  Social Connections: Not on file  Intimate Partner Violence: Not on file      No Known Allergies         Current Outpatient Medications  Medication Sig Dispense Refill   aspirin 325 MG EC tablet Take 325 mg by mouth at bedtime.        latanoprost (XALATAN) 0.005 % ophthalmic solution Place 1 drop into both eyes at bedtime.       lisinopril-hydrochlorothiazide (PRINZIDE,ZESTORETIC) 20-25 MG tablet Take 1 tablet by mouth daily. (Patient taking differently: Take 1 tablet by mouth at bedtime.) 90 tablet 1   metoprolol succinate (TOPROL-XL) 100 MG 24 hr tablet Take 1 tablet (100 mg total) by mouth daily. Take with or immediately following a meal. (Patient taking differently: Take 100 mg by mouth at bedtime. Take with or immediately following a meal.) 90 tablet 1   simvastatin (ZOCOR) 80 MG tablet TAKE 1 TABLET(80 MG) BY MOUTH AT BEDTIME (Patient taking differently: Take 80 mg by mouth at bedtime.) 90 tablet 1    No current facility-administered medications for this visit.             Family History  Problem Relation Age of Onset   Cancer Mother      Heart disease Father     Heart disease Brother     Cancer Brother          throat cancer            Physical Exam: There were no vitals taken for this visit. EXAM Lungs: somewhat decrease on the Right base but otherwise clear Card: RR with 3/6 sem Abd: obese Ext: no verocosities Teeth: in good repair     Diagnostic Studies & Laboratory data:I have personally reviewed the studies and agree with the results   TTE 10/2021   IMPRESSIONS   1. Left ventricular ejection fraction, by estimation, is 60 to 65%. The  left ventricle has normal function. The left ventricle has no regional  wall motion abnormalities. Left ventricular diastolic parameters were  normal.   2. Right ventricular systolic function is normal. The right ventricular  size is normal.   3. Left atrial size was moderately dilated.   4. The mitral valve is normal in structure. No evidence of mitral valve  regurgitation. No evidence of mitral stenosis.   5. The aortic valve is tricuspid. There is severe calcifcation of the  aortic valve. There is moderate thickening of the aortic valve. Aortic  valve regurgitation is not visualized. Severe aortic valve stenosis.   6. Aortic dilatation noted. There is mild dilatation of the aortic root,  measuring 39 mm. There is moderate dilatation of the ascending aorta,  measuring 43 mm.   7. The inferior vena cava is normal in size with greater than 50%  respiratory variability, suggesting right atrial pressure of 3 mmHg.   FINDINGS   Left Ventricle: Left ventricular ejection fraction, by estimation, is 60  to 65%. The left ventricle has normal function. The left ventricle has no  regional wall motion abnormalities. Definity contrast agent was given IV  to delineate the left ventricular   endocardial borders. The left ventricular internal cavity size was normal  in size. There is no left ventricular hypertrophy. Left ventricular  diastolic parameters were normal.   Right  Ventricle: The right ventricular size is normal. No increase in  right ventricular wall thickness. Right ventricular systolic function is  normal.   Left Atrium: Left atrial size was moderately dilated.   Right Atrium: Right atrial size was normal in size.   Pericardium: There is no evidence of pericardial effusion.   Mitral Valve: The mitral valve is normal in structure. No evidence of  mitral valve regurgitation. No evidence of mitral valve stenosis.   Tricuspid Valve: The tricuspid valve is normal in structure. Tricuspid  valve regurgitation is not demonstrated. No evidence of tricuspid  stenosis.   Aortic Valve: The aortic valve is tricuspid. There is severe calcifcation  of the aortic valve. There is moderate thickening of the aortic valve.  Aortic valve regurgitation is not visualized. Severe aortic stenosis is  present. Aortic valve mean gradient  measures 44.0 mmHg. Aortic valve peak gradient measures 79.9 mmHg. Aortic  valve area, by VTI measures 1.07 cm.   Pulmonic Valve: The pulmonic valve was normal in structure. Pulmonic valve  regurgitation is not visualized. No evidence of pulmonic stenosis.   Aorta: Aortic dilatation noted. There is mild dilatation of the aortic  root, measuring 39 mm. There is moderate dilatation of the ascending  aorta, measuring 43 mm.   Venous: The inferior vena cava is normal in size with greater than 50%  respiratory variability, suggesting right atrial pressure of 3 mmHg.   IAS/Shunts: No atrial level shunt detected by color flow Doppler.      LEFT VENTRICLE  PLAX 2D  LVIDd:         5.40 cm      Diastology  LVIDs:         3.10 cm      LV e' medial:    7.23 cm/s  LV PW:         1.10 cm      LV E/e' medial:  11.7  LV IVS:        1.10 cm      LV e' lateral:   9.30 cm/s  LVOT diam:     2.40 cm      LV E/e' lateral: 9.1  LV SV:         114  LV SV Index:   45  LVOT Area:     4.52 cm     LV Volumes (MOD)  LV vol d, MOD A2C: 76.0 ml   LV vol d, MOD A4C: 134.0 ml  LV vol s, MOD A2C: 26.9 ml  LV vol s, MOD A4C: 54.0 ml  LV SV MOD A2C:     49.1 ml  LV SV MOD A4C:     134.0 ml  LV SV MOD BP:      63.2 ml    CATH 10/2021 Conclusion       Ost RCA to Prox RCA lesion is 80% stenosed.   Prox LAD to Mid LAD lesion is 30% stenosed.   Prox Cx to Mid Cx lesion is 70% stenosed.   There is severe aortic valve stenosis.   1.  Severe single-vessel coronary artery disease with severe ostial stenosis of the RCA and left-to-right collaterals supplying the distal RCA branches which fill both antegrade and via collaterals 2.  Patent left main and LAD without significant stenosis 3.  Patent left circumflex into a large first OM and moderately severe stenosis of the AV circumflex beyond the OM supplying a small myocardial territory 4.  Severe calcific aortic stenosis with mean transvalvular gradient 50 mmHg, peak to peak gradient 61 mmHg, calculated valve area 0.89 cm 5.  Normal right heart hemodynamics  Recommendations: Favor medical therapy for CAD, CT angiography studies for planning of aortic valve replacement via TAVR versus SAVR     CTA (11/2021) FINDINGS: Aortic Valve: Calcified tri leaflet with score 3469   Aorta: Mild aneurysmal dilatation normal arch vessels mild calcific atherosclerosis   Sino-tubular Junction: 34 mm   Ascending Thoracic Aorta: 39 mm   Aortic Arch: 29 mm   Descending Thoracic Aorta: 29 mm   Sinus of Valsalva Measurements:   Non-coronary: 33.7 mm   Right - coronary: 35.7 mm   Left -   coronary: 33.6 mm   Coronary Artery Height above Annulus:   Left Main: 17.6 mm above annulus   Right Coronary: 19.6 mm above annulus   Virtual Basal Annulus Measurements:   Maximum / Minimum Diameter: 29.4 mm x 27.7 mm   Perimeter: 88.2 mm   Area: 589 mm2   Coronary Arteries: Sufficient height above annulus for deployment   Optimum Fluoroscopic Angle for Delivery: LAO 5 Caudal 5 degrees    IMPRESSION: 1.  Tri leaflet AV with calcium score 3469   2. Annular area of 589 mm2 suitable for a 29 mm Sapien 3 Ultra valve   3.  Coronary arteries sufficient height above annulus for deployment   4.  Optimum angiographic angle for deployment LAO 5 Caudal 5 degrees   5.  Membranous septal length 8.4 mm  VR    Recent Radiology Findings:    Imaging Results (Last 48 hours)  No results found.       Recent Lab Findings: Recent Labs       Lab Results  Component Value Date    WBC 5.2 11/22/2021    HGB 13.6 11/29/2021    HCT 40.0 11/29/2021    PLT 164 11/22/2021    GLUCOSE 145 (H) 11/22/2021    CHOL 144 08/12/2017    TRIG 117 08/12/2017    HDL 30 (L) 08/12/2017    LDLCALC 91 08/12/2017    ALT 30 08/12/2017    AST 22 08/12/2017    NA 141 11/29/2021    K 3.7 11/29/2021    CL 102 11/22/2021    CREATININE 0.88 11/22/2021    BUN 10 11/22/2021    CO2 33 (H) 11/22/2021    TSH 1.521 05/13/2014    HGBA1C 6.4 (H) 04/04/2017            Assessment / Plan:     Severe AS and single vessel CAD Pt has NYHA class II symptoms and a class I indication for AVR. With regard to his age, overall low surgical risk score and tight ostial calcified RCA lesion, I feel pt would best be served long term with SAVR and CAB with RSVG to RCA. Pt and wife have had all the information on risks and goals of surgery and those for TAVR and after this discussion, was felt that surgical intervention was best for him. He has some abdominal obesity with some added wound healing issues but overall a low risk candidate. I have spent over 60 min in review of the records, viewing the studies and in face to face with pt and wife and in coordination of future care

## 2022-01-22 NOTE — Anesthesia Preprocedure Evaluation (Signed)
Anesthesia Evaluation  Patient identified by MRN, date of birth, ID band Patient awake    Reviewed: Allergy & Precautions, NPO status , Patient's Chart, lab work & pertinent test results, reviewed documented beta blocker date and time   History of Anesthesia Complications Negative for: history of anesthetic complications  Airway Mallampati: II  TM Distance: >3 FB Neck ROM: Full    Dental  (+) Caps, Dental Advisory Given   Pulmonary shortness of breath, former smoker   breath sounds clear to auscultation       Cardiovascular hypertension, Pt. on medications and Pt. on home beta blockers + angina  + CAD  + Valvular Problems/Murmurs AS  Rhythm:Regular Rate:Normal + Systolic murmurs 04/7033 Cath: severe single vessel RCA disease Severe AS with mean grad 50 mmHg, peak grad 61 mmHg 10/2021 ECHO: EF 60-65%, normal LVF, normal RVF, mod dilated LA, severe thickening of AV, mean grad 44 mmHg, peak grad 79.9 mmHg   Neuro/Psych negative neurological ROS     GI/Hepatic Neg liver ROS,GERD  Controlled,,  Endo/Other    Morbid obesityBMI 41  Renal/GU Renal diseasestones     Musculoskeletal  (+) Arthritis ,    Abdominal  (+) + obese  Peds  Hematology negative hematology ROS (+)   Anesthesia Other Findings   Reproductive/Obstetrics                              Anesthesia Physical Anesthesia Plan  ASA: 4  Anesthesia Plan: General   Post-op Pain Management:    Induction: Intravenous  PONV Risk Score and Plan: 2 and Treatment may vary due to age or medical condition  Airway Management Planned: Oral ETT  Additional Equipment: Arterial line, PA Cath, TEE and Ultrasound Guidance Line Placement  Intra-op Plan:   Post-operative Plan: Post-operative intubation/ventilation  Informed Consent: I have reviewed the patients History and Physical, chart, labs and discussed the procedure including the risks,  benefits and alternatives for the proposed anesthesia with the patient or authorized representative who has indicated his/her understanding and acceptance.     Dental advisory given  Plan Discussed with: CRNA and Surgeon  Anesthesia Plan Comments:          Anesthesia Quick Evaluation

## 2022-01-23 ENCOUNTER — Inpatient Hospital Stay (HOSPITAL_COMMUNITY): Payer: Medicare Other

## 2022-01-23 ENCOUNTER — Inpatient Hospital Stay (HOSPITAL_COMMUNITY): Payer: Medicare Other | Admitting: Physician Assistant

## 2022-01-23 ENCOUNTER — Other Ambulatory Visit: Payer: Self-pay

## 2022-01-23 ENCOUNTER — Inpatient Hospital Stay (HOSPITAL_COMMUNITY)
Admission: RE | Admit: 2022-01-23 | Discharge: 2022-01-28 | DRG: 220 | Disposition: A | Discharge status: Home / Self Care | Payer: Medicare Other | Source: Ambulatory Visit | Attending: Thoracic Surgery (Cardiothoracic Vascular Surgery) | Admitting: Thoracic Surgery (Cardiothoracic Vascular Surgery)

## 2022-01-23 ENCOUNTER — Inpatient Hospital Stay (HOSPITAL_COMMUNITY): Payer: Medicare Other | Admitting: Certified Registered"

## 2022-01-23 ENCOUNTER — Encounter (HOSPITAL_COMMUNITY): Payer: Self-pay | Admitting: Thoracic Surgery (Cardiothoracic Vascular Surgery)

## 2022-01-23 ENCOUNTER — Encounter (HOSPITAL_COMMUNITY)
Admission: RE | Disposition: A | Discharge status: Home / Self Care | Payer: Self-pay | Source: Ambulatory Visit | Attending: Thoracic Surgery (Cardiothoracic Vascular Surgery)

## 2022-01-23 DIAGNOSIS — D62 Acute posthemorrhagic anemia: Secondary | ICD-10-CM | POA: Diagnosis not present

## 2022-01-23 DIAGNOSIS — I4729 Other ventricular tachycardia: Secondary | ICD-10-CM | POA: Diagnosis not present

## 2022-01-23 DIAGNOSIS — I088 Other rheumatic multiple valve diseases: Secondary | ICD-10-CM | POA: Diagnosis not present

## 2022-01-23 DIAGNOSIS — Z4682 Encounter for fitting and adjustment of non-vascular catheter: Secondary | ICD-10-CM | POA: Diagnosis not present

## 2022-01-23 DIAGNOSIS — I472 Ventricular tachycardia, unspecified: Secondary | ICD-10-CM | POA: Diagnosis not present

## 2022-01-23 DIAGNOSIS — I10 Essential (primary) hypertension: Secondary | ICD-10-CM | POA: Diagnosis not present

## 2022-01-23 DIAGNOSIS — Z6841 Body Mass Index (BMI) 40.0 and over, adult: Secondary | ICD-10-CM | POA: Diagnosis not present

## 2022-01-23 DIAGNOSIS — I493 Ventricular premature depolarization: Secondary | ICD-10-CM | POA: Diagnosis not present

## 2022-01-23 DIAGNOSIS — Z952 Presence of prosthetic heart valve: Secondary | ICD-10-CM | POA: Diagnosis not present

## 2022-01-23 DIAGNOSIS — Z951 Presence of aortocoronary bypass graft: Secondary | ICD-10-CM | POA: Diagnosis not present

## 2022-01-23 DIAGNOSIS — K219 Gastro-esophageal reflux disease without esophagitis: Secondary | ICD-10-CM | POA: Diagnosis not present

## 2022-01-23 DIAGNOSIS — R7303 Prediabetes: Secondary | ICD-10-CM | POA: Diagnosis present

## 2022-01-23 DIAGNOSIS — I2510 Atherosclerotic heart disease of native coronary artery without angina pectoris: Secondary | ICD-10-CM

## 2022-01-23 DIAGNOSIS — Z452 Encounter for adjustment and management of vascular access device: Secondary | ICD-10-CM | POA: Diagnosis not present

## 2022-01-23 DIAGNOSIS — J9811 Atelectasis: Secondary | ICD-10-CM | POA: Diagnosis not present

## 2022-01-23 DIAGNOSIS — J90 Pleural effusion, not elsewhere classified: Secondary | ICD-10-CM | POA: Diagnosis not present

## 2022-01-23 DIAGNOSIS — E8770 Fluid overload, unspecified: Secondary | ICD-10-CM | POA: Diagnosis not present

## 2022-01-23 DIAGNOSIS — Z8616 Personal history of COVID-19: Secondary | ICD-10-CM

## 2022-01-23 DIAGNOSIS — Z87891 Personal history of nicotine dependence: Secondary | ICD-10-CM

## 2022-01-23 DIAGNOSIS — E785 Hyperlipidemia, unspecified: Secondary | ICD-10-CM | POA: Diagnosis present

## 2022-01-23 DIAGNOSIS — Z7982 Long term (current) use of aspirin: Secondary | ICD-10-CM | POA: Diagnosis not present

## 2022-01-23 DIAGNOSIS — R911 Solitary pulmonary nodule: Secondary | ICD-10-CM | POA: Diagnosis present

## 2022-01-23 DIAGNOSIS — M19012 Primary osteoarthritis, left shoulder: Secondary | ICD-10-CM | POA: Diagnosis not present

## 2022-01-23 DIAGNOSIS — D696 Thrombocytopenia, unspecified: Secondary | ICD-10-CM | POA: Diagnosis not present

## 2022-01-23 DIAGNOSIS — Z79899 Other long term (current) drug therapy: Secondary | ICD-10-CM | POA: Diagnosis not present

## 2022-01-23 DIAGNOSIS — R0689 Other abnormalities of breathing: Secondary | ICD-10-CM | POA: Diagnosis not present

## 2022-01-23 DIAGNOSIS — I350 Nonrheumatic aortic (valve) stenosis: Principal | ICD-10-CM | POA: Diagnosis present

## 2022-01-23 DIAGNOSIS — J9589 Other postprocedural complications and disorders of respiratory system, not elsewhere classified: Secondary | ICD-10-CM

## 2022-01-23 DIAGNOSIS — J984 Other disorders of lung: Secondary | ICD-10-CM | POA: Diagnosis not present

## 2022-01-23 DIAGNOSIS — R918 Other nonspecific abnormal finding of lung field: Secondary | ICD-10-CM | POA: Diagnosis not present

## 2022-01-23 DIAGNOSIS — I25119 Atherosclerotic heart disease of native coronary artery with unspecified angina pectoris: Secondary | ICD-10-CM | POA: Diagnosis not present

## 2022-01-23 DIAGNOSIS — M19011 Primary osteoarthritis, right shoulder: Secondary | ICD-10-CM | POA: Diagnosis not present

## 2022-01-23 DIAGNOSIS — R739 Hyperglycemia, unspecified: Secondary | ICD-10-CM | POA: Diagnosis not present

## 2022-01-23 HISTORY — PX: TEE WITHOUT CARDIOVERSION: SHX5443

## 2022-01-23 HISTORY — PX: AORTIC VALVE REPLACEMENT: SHX41

## 2022-01-23 HISTORY — PX: CORONARY ARTERY BYPASS GRAFT: SHX141

## 2022-01-23 LAB — POCT I-STAT 7, (LYTES, BLD GAS, ICA,H+H)
Acid-Base Excess: 3 mmol/L — ABNORMAL HIGH (ref 0.0–2.0)
Acid-base deficit: 4 mmol/L — ABNORMAL HIGH (ref 0.0–2.0)
Acid-base deficit: 3 mmol/L — ABNORMAL HIGH (ref 0.0–2.0)
Acid-base deficit: 4 mmol/L — ABNORMAL HIGH (ref 0.0–2.0)
Bicarbonate: 29.2 mmol/L — ABNORMAL HIGH (ref 20.0–28.0)
Bicarbonate: 22.9 mmol/L (ref 20.0–28.0)
Bicarbonate: 23 mmol/L (ref 20.0–28.0)
Bicarbonate: 21.6 mmol/L (ref 20.0–28.0)
Calcium, Ion: 1.16 mmol/L (ref 1.15–1.40)
Calcium, Ion: 1.19 mmol/L (ref 1.15–1.40)
Calcium, Ion: 1.2 mmol/L (ref 1.15–1.40)
Calcium, Ion: 1.19 mmol/L (ref 1.15–1.40)
HCT: 33 % — ABNORMAL LOW (ref 39.0–52.0)
HCT: 35 % — ABNORMAL LOW (ref 39.0–52.0)
HCT: 35 % — ABNORMAL LOW (ref 39.0–52.0)
HCT: 34 % — ABNORMAL LOW (ref 39.0–52.0)
Hemoglobin: 11.2 g/dL — ABNORMAL LOW (ref 13.0–17.0)
Hemoglobin: 11.9 g/dL — ABNORMAL LOW (ref 13.0–17.0)
Hemoglobin: 11.9 g/dL — ABNORMAL LOW (ref 13.0–17.0)
Hemoglobin: 11.6 g/dL — ABNORMAL LOW (ref 13.0–17.0)
O2 Saturation: 100 %
O2 Saturation: 96 %
O2 Saturation: 97 %
O2 Saturation: 95 %
Patient temperature: 36.1
Patient temperature: 37.1
Patient temperature: 37.5
Potassium: 5.7 mmol/L — ABNORMAL HIGH (ref 3.5–5.1)
Potassium: 3.6 mmol/L (ref 3.5–5.1)
Potassium: 4.5 mmol/L (ref 3.5–5.1)
Potassium: 4.6 mmol/L (ref 3.5–5.1)
Sodium: 138 mmol/L (ref 135–145)
Sodium: 140 mmol/L (ref 135–145)
Sodium: 141 mmol/L (ref 135–145)
Sodium: 141 mmol/L (ref 135–145)
TCO2: 31 mmol/L (ref 22–32)
TCO2: 24 mmol/L (ref 22–32)
TCO2: 24 mmol/L (ref 22–32)
TCO2: 23 mmol/L (ref 22–32)
pCO2 arterial: 51.7 mmHg — ABNORMAL HIGH (ref 32–48)
pCO2 arterial: 45.2 mmHg (ref 32–48)
pCO2 arterial: 46.5 mmHg (ref 32–48)
pCO2 arterial: 42.1 mmHg (ref 32–48)
pH, Arterial: 7.359 (ref 7.35–7.45)
pH, Arterial: 7.309 — ABNORMAL LOW (ref 7.35–7.45)
pH, Arterial: 7.302 — ABNORMAL LOW (ref 7.35–7.45)
pH, Arterial: 7.321 — ABNORMAL LOW (ref 7.35–7.45)
pO2, Arterial: 410 mmHg — ABNORMAL HIGH (ref 83–108)
pO2, Arterial: 88 mmHg (ref 83–108)
pO2, Arterial: 106 mmHg (ref 83–108)
pO2, Arterial: 82 mmHg — ABNORMAL LOW (ref 83–108)

## 2022-01-23 LAB — CBC
HCT: 37.4 % — ABNORMAL LOW (ref 39.0–52.0)
HCT: 35.2 % — ABNORMAL LOW (ref 39.0–52.0)
Hemoglobin: 12.8 g/dL — ABNORMAL LOW (ref 13.0–17.0)
Hemoglobin: 12.4 g/dL — ABNORMAL LOW (ref 13.0–17.0)
MCH: 31.4 pg (ref 26.0–34.0)
MCH: 31.9 pg (ref 26.0–34.0)
MCHC: 34.2 g/dL (ref 30.0–36.0)
MCHC: 35.2 g/dL (ref 30.0–36.0)
MCV: 91.9 fL (ref 80.0–100.0)
MCV: 90.5 fL (ref 80.0–100.0)
Platelets: 131 10*3/uL — ABNORMAL LOW (ref 150–400)
Platelets: 157 10*3/uL (ref 150–400)
RBC: 4.07 MIL/uL — ABNORMAL LOW (ref 4.22–5.81)
RBC: 3.89 MIL/uL — ABNORMAL LOW (ref 4.22–5.81)
RDW: 12.8 % (ref 11.5–15.5)
RDW: 13.1 % (ref 11.5–15.5)
WBC: 16.1 10*3/uL — ABNORMAL HIGH (ref 4.0–10.5)
WBC: 15.6 10*3/uL — ABNORMAL HIGH (ref 4.0–10.5)
nRBC: 0 % (ref 0.0–0.2)
nRBC: 0 % (ref 0.0–0.2)

## 2022-01-23 LAB — POCT I-STAT EG7
Acid-Base Excess: 1 mmol/L (ref 0.0–2.0)
Bicarbonate: 27.8 mmol/L (ref 20.0–28.0)
Calcium, Ion: 1.2 mmol/L (ref 1.15–1.40)
HCT: 33 % — ABNORMAL LOW (ref 39.0–52.0)
Hemoglobin: 11.2 g/dL — ABNORMAL LOW (ref 13.0–17.0)
O2 Saturation: 84 %
Potassium: 4.7 mmol/L (ref 3.5–5.1)
Sodium: 139 mmol/L (ref 135–145)
TCO2: 29 mmol/L (ref 22–32)
pCO2, Ven: 53.6 mmHg (ref 44–60)
pH, Ven: 7.323 (ref 7.25–7.43)
pO2, Ven: 53 mmHg — ABNORMAL HIGH (ref 32–45)

## 2022-01-23 LAB — POCT I-STAT, CHEM 8
BUN: 10 mg/dL (ref 8–23)
BUN: 9 mg/dL (ref 8–23)
BUN: 9 mg/dL (ref 8–23)
BUN: 10 mg/dL (ref 8–23)
Calcium, Ion: 1.31 mmol/L (ref 1.15–1.40)
Calcium, Ion: 1.24 mmol/L (ref 1.15–1.40)
Calcium, Ion: 1.17 mmol/L (ref 1.15–1.40)
Calcium, Ion: 1.16 mmol/L (ref 1.15–1.40)
Chloride: 101 mmol/L (ref 98–111)
Chloride: 102 mmol/L (ref 98–111)
Chloride: 102 mmol/L (ref 98–111)
Chloride: 102 mmol/L (ref 98–111)
Creatinine, Ser: 0.7 mg/dL (ref 0.61–1.24)
Creatinine, Ser: 0.7 mg/dL (ref 0.61–1.24)
Creatinine, Ser: 0.6 mg/dL — ABNORMAL LOW (ref 0.61–1.24)
Creatinine, Ser: 0.7 mg/dL (ref 0.61–1.24)
Glucose, Bld: 125 mg/dL — ABNORMAL HIGH (ref 70–99)
Glucose, Bld: 128 mg/dL — ABNORMAL HIGH (ref 70–99)
Glucose, Bld: 155 mg/dL — ABNORMAL HIGH (ref 70–99)
Glucose, Bld: 197 mg/dL — ABNORMAL HIGH (ref 70–99)
HCT: 42 % (ref 39.0–52.0)
HCT: 38 % — ABNORMAL LOW (ref 39.0–52.0)
HCT: 34 % — ABNORMAL LOW (ref 39.0–52.0)
HCT: 33 % — ABNORMAL LOW (ref 39.0–52.0)
Hemoglobin: 14.3 g/dL (ref 13.0–17.0)
Hemoglobin: 12.9 g/dL — ABNORMAL LOW (ref 13.0–17.0)
Hemoglobin: 11.6 g/dL — ABNORMAL LOW (ref 13.0–17.0)
Hemoglobin: 11.2 g/dL — ABNORMAL LOW (ref 13.0–17.0)
Potassium: 3.8 mmol/L (ref 3.5–5.1)
Potassium: 3.8 mmol/L (ref 3.5–5.1)
Potassium: 4.3 mmol/L (ref 3.5–5.1)
Potassium: 3.9 mmol/L (ref 3.5–5.1)
Sodium: 140 mmol/L (ref 135–145)
Sodium: 139 mmol/L (ref 135–145)
Sodium: 138 mmol/L (ref 135–145)
Sodium: 138 mmol/L (ref 135–145)
TCO2: 30 mmol/L (ref 22–32)
TCO2: 25 mmol/L (ref 22–32)
TCO2: 27 mmol/L (ref 22–32)
TCO2: 27 mmol/L (ref 22–32)

## 2022-01-23 LAB — APTT: aPTT: 36 seconds (ref 24–36)

## 2022-01-23 LAB — GLUCOSE, CAPILLARY
Glucose-Capillary: 142 mg/dL — ABNORMAL HIGH (ref 70–99)
Glucose-Capillary: 158 mg/dL — ABNORMAL HIGH (ref 70–99)
Glucose-Capillary: 128 mg/dL — ABNORMAL HIGH (ref 70–99)
Glucose-Capillary: 135 mg/dL — ABNORMAL HIGH (ref 70–99)
Glucose-Capillary: 146 mg/dL — ABNORMAL HIGH (ref 70–99)
Glucose-Capillary: 102 mg/dL — ABNORMAL HIGH (ref 70–99)
Glucose-Capillary: 89 mg/dL (ref 70–99)
Glucose-Capillary: 141 mg/dL — ABNORMAL HIGH (ref 70–99)
Glucose-Capillary: 169 mg/dL — ABNORMAL HIGH (ref 70–99)
Glucose-Capillary: 148 mg/dL — ABNORMAL HIGH (ref 70–99)
Glucose-Capillary: 153 mg/dL — ABNORMAL HIGH (ref 70–99)

## 2022-01-23 LAB — PROTIME-INR
INR: 1.3 — ABNORMAL HIGH (ref 0.8–1.2)
Prothrombin Time: 16.4 seconds — ABNORMAL HIGH (ref 11.4–15.2)

## 2022-01-23 LAB — PLATELET COUNT: Platelets: 150 10*3/uL (ref 150–400)

## 2022-01-23 LAB — BASIC METABOLIC PANEL
Anion gap: 11 (ref 5–15)
BUN: 10 mg/dL (ref 8–23)
CO2: 23 mmol/L (ref 22–32)
Calcium: 8.6 mg/dL — ABNORMAL LOW (ref 8.9–10.3)
Chloride: 108 mmol/L (ref 98–111)
Creatinine, Ser: 0.86 mg/dL (ref 0.61–1.24)
GFR, Estimated: >60 mL/min (ref >60)
Glucose, Bld: 145 mg/dL — ABNORMAL HIGH (ref 70–99)
Potassium: 4.7 mmol/L (ref 3.5–5.1)
Sodium: 142 mmol/L (ref 135–145)

## 2022-01-23 LAB — ABO/RH: ABO/RH(D): B POS

## 2022-01-23 LAB — MAGNESIUM: Magnesium: 2.8 mg/dL — ABNORMAL HIGH (ref 1.7–2.4)

## 2022-01-23 LAB — HEMOGLOBIN AND HEMATOCRIT, BLOOD
HCT: 35.4 % — ABNORMAL LOW (ref 39.0–52.0)
Hemoglobin: 12.3 g/dL — ABNORMAL LOW (ref 13.0–17.0)

## 2022-01-23 LAB — ECHO INTRAOPERATIVE TEE: AV Mean grad: 51 mmHg

## 2022-01-23 SURGERY — REPLACEMENT, AORTIC VALVE, OPEN
Anesthesia: General | Site: Chest

## 2022-01-23 MED ORDER — CHLORHEXIDINE GLUCONATE 0.12 % MT SOLN
15.0000 mL | OROMUCOSAL | Status: AC
  Administered 2022-01-23: 15 mL via OROMUCOSAL

## 2022-01-23 MED ORDER — ORAL CARE MOUTH RINSE: 15.0000 mL | Freq: Once | OROMUCOSAL | Status: AC

## 2022-01-23 MED ORDER — PANTOPRAZOLE SODIUM 40 MG PO TBEC
40.0000 mg | DELAYED_RELEASE_TABLET | Freq: Every day | ORAL | Status: DC
  Administered 2022-01-25 – 2022-01-28 (×4): 40 mg via ORAL
  Filled 2022-01-23 (×4): qty 1

## 2022-01-23 MED ORDER — CEFAZOLIN SODIUM-DEXTROSE 2-4 GM/100ML-% IV SOLN
2.0000 g | Freq: Three times a day (TID) | INTRAVENOUS | Status: AC
  Administered 2022-01-23 – 2022-01-25 (×6): 2 g via INTRAVENOUS
  Filled 2022-01-23 (×6): qty 100

## 2022-01-23 MED ORDER — SODIUM CHLORIDE 0.45 % IV SOLN: INTRAVENOUS | Status: DC | PRN

## 2022-01-23 MED ORDER — FENTANYL CITRATE (PF) 250 MCG/5ML IJ SOLN
INTRAMUSCULAR | Status: DC | PRN
  Administered 2022-01-23: 150 ug via INTRAVENOUS
  Administered 2022-01-23: 100 ug via INTRAVENOUS
  Administered 2022-01-23: 900 ug via INTRAVENOUS
  Administered 2022-01-23: 100 ug via INTRAVENOUS
  Administered 2022-01-23 (×5): 50 ug via INTRAVENOUS

## 2022-01-23 MED ORDER — FENTANYL CITRATE (PF) 250 MCG/5ML IJ SOLN
INTRAMUSCULAR | Status: AC
  Filled 2022-01-23: qty 5

## 2022-01-23 MED ORDER — DEXMEDETOMIDINE HCL IN NACL 400 MCG/100ML IV SOLN
0.0000 ug/kg/h | INTRAVENOUS | Status: DC
  Administered 2022-01-23: 0.7 ug/kg/h via INTRAVENOUS
  Filled 2022-01-23: qty 100

## 2022-01-23 MED ORDER — ACETAMINOPHEN 500 MG PO TABS
1000.0000 mg | ORAL_TABLET | Freq: Four times a day (QID) | ORAL | Status: DC
  Administered 2022-01-23 – 2022-01-28 (×18): 1000 mg via ORAL
  Filled 2022-01-23 (×17): qty 2

## 2022-01-23 MED ORDER — SODIUM CHLORIDE 0.9% FLUSH: 3.0000 mL | INTRAVENOUS | Status: DC | PRN

## 2022-01-23 MED ORDER — MAGNESIUM SULFATE 4 GM/100ML IV SOLN
4.0000 g | Freq: Once | INTRAVENOUS | Status: AC
  Administered 2022-01-23: 4 g via INTRAVENOUS
  Filled 2022-01-23: qty 100

## 2022-01-23 MED ORDER — MORPHINE SULFATE (PF) 2 MG/ML IV SOLN
1.0000 mg | INTRAVENOUS | Status: DC | PRN
  Administered 2022-01-23 (×3): 2 mg via INTRAVENOUS
  Administered 2022-01-23: 4 mg via INTRAVENOUS
  Administered 2022-01-23: 2 mg via INTRAVENOUS
  Administered 2022-01-24: 4 mg via INTRAVENOUS
  Administered 2022-01-24 (×3): 2 mg via INTRAVENOUS
  Administered 2022-01-24: 3 mg via INTRAVENOUS
  Administered 2022-01-24 (×2): 2 mg via INTRAVENOUS
  Filled 2022-01-23 (×4): qty 1
  Filled 2022-01-23 (×2): qty 2
  Filled 2022-01-23 (×5): qty 1
  Filled 2022-01-23: qty 2

## 2022-01-23 MED ORDER — BISACODYL 5 MG PO TBEC
10.0000 mg | DELAYED_RELEASE_TABLET | Freq: Every day | ORAL | Status: DC
  Administered 2022-01-24 – 2022-01-27 (×4): 10 mg via ORAL
  Filled 2022-01-23 (×4): qty 2

## 2022-01-23 MED ORDER — BISACODYL 10 MG RE SUPP: 10.0000 mg | Freq: Every day | RECTAL | Status: DC

## 2022-01-23 MED ORDER — PHENYLEPHRINE 80 MCG/ML (10ML) SYRINGE FOR IV PUSH (FOR BLOOD PRESSURE SUPPORT)
PREFILLED_SYRINGE | INTRAVENOUS | Status: AC
  Filled 2022-01-23: qty 10

## 2022-01-23 MED ORDER — DEXTROSE 50 % IV SOLN: 0.0000 mL | INTRAVENOUS | Status: DC | PRN

## 2022-01-23 MED ORDER — EPHEDRINE 5 MG/ML INJ
INTRAVENOUS | Status: AC
  Filled 2022-01-23: qty 5

## 2022-01-23 MED ORDER — PROTAMINE SULFATE 10 MG/ML IV SOLN
INTRAVENOUS | Status: AC
  Filled 2022-01-23: qty 25

## 2022-01-23 MED ORDER — SODIUM CHLORIDE 0.9 % IV SOLN: INTRAVENOUS | Status: DC

## 2022-01-23 MED ORDER — ~~LOC~~ CARDIAC SURGERY, PATIENT & FAMILY EDUCATION
Freq: Once | Status: DC
  Filled 2022-01-23: qty 1

## 2022-01-23 MED ORDER — PROTAMINE SULFATE 10 MG/ML IV SOLN
INTRAVENOUS | Status: DC | PRN
  Administered 2022-01-23: 420 mg via INTRAVENOUS

## 2022-01-23 MED ORDER — SODIUM CHLORIDE 0.9% FLUSH
3.0000 mL | Freq: Two times a day (BID) | INTRAVENOUS | Status: DC
  Administered 2022-01-24 – 2022-01-28 (×9): 3 mL via INTRAVENOUS

## 2022-01-23 MED ORDER — EPHEDRINE SULFATE-NACL 50-0.9 MG/10ML-% IV SOSY
PREFILLED_SYRINGE | INTRAVENOUS | Status: DC | PRN
  Administered 2022-01-23: 5 mg via INTRAVENOUS

## 2022-01-23 MED ORDER — ACETAMINOPHEN 500 MG PO TABS
ORAL_TABLET | ORAL | Status: AC
  Filled 2022-01-23: qty 1

## 2022-01-23 MED ORDER — ASPIRIN 325 MG PO TBEC
325.0000 mg | DELAYED_RELEASE_TABLET | Freq: Every day | ORAL | Status: DC
  Administered 2022-01-24 – 2022-01-28 (×5): 325 mg via ORAL
  Filled 2022-01-23 (×6): qty 1

## 2022-01-23 MED ORDER — INSULIN REGULAR(HUMAN) IN NACL 100-0.9 UT/100ML-% IV SOLN: INTRAVENOUS | Status: DC

## 2022-01-23 MED ORDER — TRAMADOL HCL 50 MG PO TABS
50.0000 mg | ORAL_TABLET | ORAL | Status: DC | PRN
  Administered 2022-01-24: 100 mg via ORAL
  Administered 2022-01-24: 50 mg via ORAL
  Filled 2022-01-23: qty 2
  Filled 2022-01-23: qty 1

## 2022-01-23 MED ORDER — VANCOMYCIN HCL IN DEXTROSE 1-5 GM/200ML-% IV SOLN
1000.0000 mg | Freq: Once | INTRAVENOUS | Status: AC
  Administered 2022-01-23: 1000 mg via INTRAVENOUS
  Filled 2022-01-23: qty 200

## 2022-01-23 MED ORDER — PAPAVERINE HCL 30 MG/ML IJ SOLN
INTRAMUSCULAR | Status: DC | PRN
  Administered 2022-01-23: 60 mg via INTRAVENOUS

## 2022-01-23 MED ORDER — CHLORHEXIDINE GLUCONATE 4 % EX LIQD: 30.0000 mL | CUTANEOUS | Status: DC

## 2022-01-23 MED ORDER — CHLORHEXIDINE GLUCONATE 0.12 % MT SOLN
OROMUCOSAL | Status: AC
  Administered 2022-01-23: 15 mL via OROMUCOSAL
  Filled 2022-01-23: qty 15

## 2022-01-23 MED ORDER — ACETAMINOPHEN 160 MG/5ML PO SOLN: 1000.0000 mg | Freq: Four times a day (QID) | ORAL | Status: DC

## 2022-01-23 MED ORDER — ACETAMINOPHEN 650 MG RE SUPP: 650.0000 mg | Freq: Once | RECTAL | Status: DC

## 2022-01-23 MED ORDER — METOPROLOL TARTRATE 5 MG/5ML IV SOLN: 2.5000 mg | INTRAVENOUS | Status: DC | PRN

## 2022-01-23 MED ORDER — ATORVASTATIN CALCIUM 80 MG PO TABS
80.0000 mg | ORAL_TABLET | Freq: Every day | ORAL | Status: DC
  Administered 2022-01-24 – 2022-01-28 (×5): 80 mg via ORAL
  Filled 2022-01-23 (×5): qty 1

## 2022-01-23 MED ORDER — ROCURONIUM BROMIDE 10 MG/ML (PF) SYRINGE
PREFILLED_SYRINGE | INTRAVENOUS | Status: DC | PRN
  Administered 2022-01-23: 80 mg via INTRAVENOUS
  Administered 2022-01-23: 30 mg via INTRAVENOUS
  Administered 2022-01-23 (×2): 20 mg via INTRAVENOUS
  Administered 2022-01-23: 40 mg via INTRAVENOUS

## 2022-01-23 MED ORDER — HEPARIN SODIUM (PORCINE) 1000 UNIT/ML IJ SOLN
INTRAMUSCULAR | Status: AC
  Filled 2022-01-23: qty 1

## 2022-01-23 MED ORDER — ONDANSETRON HCL 4 MG/2ML IJ SOLN
4.0000 mg | Freq: Four times a day (QID) | INTRAMUSCULAR | Status: DC | PRN
  Administered 2022-01-24: 4 mg via INTRAVENOUS
  Filled 2022-01-23: qty 2

## 2022-01-23 MED ORDER — METOPROLOL TARTRATE 25 MG/10 ML ORAL SUSPENSION: 12.5000 mg | Freq: Two times a day (BID) | ORAL | Status: DC

## 2022-01-23 MED ORDER — CHLORHEXIDINE GLUCONATE 0.12 % MT SOLN: 15.0000 mL | Freq: Once | OROMUCOSAL | Status: AC

## 2022-01-23 MED ORDER — METOPROLOL TARTRATE 12.5 MG HALF TABLET: 12.5000 mg | ORAL_TABLET | Freq: Once | ORAL | Status: DC

## 2022-01-23 MED ORDER — SODIUM CHLORIDE 0.9 % IV SOLN: 250.0000 mL | INTRAVENOUS | Status: DC

## 2022-01-23 MED ORDER — ROCURONIUM BROMIDE 10 MG/ML (PF) SYRINGE
PREFILLED_SYRINGE | INTRAVENOUS | Status: AC
  Filled 2022-01-23: qty 20

## 2022-01-23 MED ORDER — POTASSIUM CHLORIDE 10 MEQ/50ML IV SOLN
10.0000 meq | INTRAVENOUS | Status: AC
  Administered 2022-01-23 (×3): 10 meq via INTRAVENOUS

## 2022-01-23 MED ORDER — ASPIRIN 81 MG PO CHEW: 324.0000 mg | CHEWABLE_TABLET | Freq: Every day | ORAL | Status: DC

## 2022-01-23 MED ORDER — PAPAVERINE HCL 30 MG/ML IJ SOLN
INTRAMUSCULAR | Status: AC
  Filled 2022-01-23: qty 2

## 2022-01-23 MED ORDER — TRANEXAMIC ACID 1000 MG/10ML IV SOLN
1.5000 mg/kg/h | INTRAVENOUS | Status: DC
  Filled 2022-01-23: qty 25

## 2022-01-23 MED ORDER — PHENYLEPHRINE 80 MCG/ML (10ML) SYRINGE FOR IV PUSH (FOR BLOOD PRESSURE SUPPORT)
PREFILLED_SYRINGE | INTRAVENOUS | Status: DC | PRN
  Administered 2022-01-23 (×3): 80 ug via INTRAVENOUS

## 2022-01-23 MED ORDER — LACTATED RINGERS IV SOLN: INTRAVENOUS | Status: DC | PRN

## 2022-01-23 MED ORDER — VANCOMYCIN HCL 1000 MG IV SOLR: INTRAVENOUS | Status: DC | PRN

## 2022-01-23 MED ORDER — PROTAMINE SULFATE 10 MG/ML IV SOLN
INTRAVENOUS | Status: AC
  Filled 2022-01-23: qty 5

## 2022-01-23 MED ORDER — CHLORHEXIDINE GLUCONATE 0.12 % MT SOLN: 15.0000 mL | Freq: Once | OROMUCOSAL | Status: DC

## 2022-01-23 MED ORDER — LACTATED RINGERS IV SOLN: INTRAVENOUS | Status: DC

## 2022-01-23 MED ORDER — PROPOFOL 10 MG/ML IV BOLUS
INTRAVENOUS | Status: DC | PRN
  Administered 2022-01-23: 20 mg via INTRAVENOUS

## 2022-01-23 MED ORDER — ACETAMINOPHEN 160 MG/5ML PO SOLN: 650.0000 mg | Freq: Once | ORAL | Status: DC

## 2022-01-23 MED ORDER — DOCUSATE SODIUM 100 MG PO CAPS
200.0000 mg | ORAL_CAPSULE | Freq: Every day | ORAL | Status: DC
  Administered 2022-01-24 – 2022-01-27 (×4): 200 mg via ORAL
  Filled 2022-01-23 (×4): qty 2

## 2022-01-23 MED ORDER — MIDAZOLAM HCL (PF) 5 MG/ML IJ SOLN
INTRAMUSCULAR | Status: DC | PRN
  Administered 2022-01-23 (×2): 1 mg via INTRAVENOUS
  Administered 2022-01-23 (×2): 2 mg via INTRAVENOUS
  Administered 2022-01-23 (×2): 1 mg via INTRAVENOUS
  Administered 2022-01-23: 2 mg via INTRAVENOUS

## 2022-01-23 MED ORDER — ACETAMINOPHEN 325 MG PO TABS
650.0000 mg | ORAL_TABLET | Freq: Once | ORAL | Status: AC
  Administered 2022-01-23: 650 mg via ORAL
  Filled 2022-01-23: qty 2

## 2022-01-23 MED ORDER — FAMOTIDINE IN NACL 20-0.9 MG/50ML-% IV SOLN
20.0000 mg | Freq: Two times a day (BID) | INTRAVENOUS | Status: AC
  Administered 2022-01-23: 20 mg via INTRAVENOUS
  Filled 2022-01-23: qty 50

## 2022-01-23 MED ORDER — OXYCODONE HCL 5 MG PO TABS
5.0000 mg | ORAL_TABLET | ORAL | Status: DC | PRN
  Administered 2022-01-23 – 2022-01-25 (×7): 10 mg via ORAL
  Filled 2022-01-23 (×7): qty 2

## 2022-01-23 MED ORDER — NITROGLYCERIN IN D5W 200-5 MCG/ML-% IV SOLN: 0.0000 ug/min | INTRAVENOUS | Status: DC

## 2022-01-23 MED ORDER — PROTAMINE SULFATE 10 MG/ML IV SOLN
INTRAVENOUS | Status: AC
  Filled 2022-01-23: qty 10

## 2022-01-23 MED ORDER — METOPROLOL TARTRATE 12.5 MG HALF TABLET
12.5000 mg | ORAL_TABLET | Freq: Two times a day (BID) | ORAL | Status: DC
  Administered 2022-01-24 – 2022-01-25 (×3): 12.5 mg via ORAL
  Filled 2022-01-23 (×4): qty 1

## 2022-01-23 MED ORDER — HEPARIN SODIUM (PORCINE) 1000 UNIT/ML IJ SOLN
INTRAMUSCULAR | Status: DC | PRN
  Administered 2022-01-23: 45000 [IU] via INTRAVENOUS

## 2022-01-23 MED ORDER — MIDAZOLAM HCL 2 MG/2ML IJ SOLN: 2.0000 mg | INTRAMUSCULAR | Status: DC | PRN

## 2022-01-23 MED ORDER — PHENYLEPHRINE HCL-NACL 20-0.9 MG/250ML-% IV SOLN
0.0000 ug/min | INTRAVENOUS | Status: DC
  Administered 2022-01-23: 35 ug/min via INTRAVENOUS
  Administered 2022-01-24: 45 ug/min via INTRAVENOUS
  Filled 2022-01-23 (×3): qty 250

## 2022-01-23 MED ORDER — LATANOPROST 0.005 % OP SOLN
1.0000 [drp] | Freq: Every day | OPHTHALMIC | Status: DC
  Administered 2022-01-23 – 2022-01-27 (×5): 1 [drp] via OPHTHALMIC
  Filled 2022-01-23: qty 2.5

## 2022-01-23 MED ORDER — HEPARIN SODIUM (PORCINE) 1000 UNIT/ML IJ SOLN
INTRAMUSCULAR | Status: AC
  Filled 2022-01-23: qty 20

## 2022-01-23 MED ORDER — LACTATED RINGERS IV SOLN: 500.0000 mL | Freq: Once | INTRAVENOUS | Status: DC | PRN

## 2022-01-23 MED ORDER — 0.9 % SODIUM CHLORIDE (POUR BTL) OPTIME
TOPICAL | Status: DC | PRN
  Administered 2022-01-23: 5000 mL

## 2022-01-23 MED ORDER — PLASMA-LYTE A IV SOLN: INTRAVENOUS | Status: DC | PRN

## 2022-01-23 MED ORDER — ALBUMIN HUMAN 5 % IV SOLN
250.0000 mL | INTRAVENOUS | Status: AC | PRN
  Administered 2022-01-23 (×3): 12.5 g via INTRAVENOUS
  Filled 2022-01-23: qty 250

## 2022-01-23 MED ORDER — MIDAZOLAM HCL (PF) 10 MG/2ML IJ SOLN
INTRAMUSCULAR | Status: AC
  Filled 2022-01-23: qty 2

## 2022-01-23 MED ORDER — PROPOFOL 10 MG/ML IV BOLUS
INTRAVENOUS | Status: AC
  Filled 2022-01-23: qty 20

## 2022-01-23 MED ORDER — ALBUMIN HUMAN 5 % IV SOLN: INTRAVENOUS | Status: DC | PRN

## 2022-01-23 SURGICAL SUPPLY — 134 items
ADAPTER CARDIO PERF ANTE/RETRO (ADAPTER) ×2 IMPLANT
ADH SKN CLS APL DERMABOND .7 (GAUZE/BANDAGES/DRESSINGS) ×4
ADPR CRDPLG 7.5 .25D 1 LRG Y (ADAPTER) ×2
ADPR PRFSN 84XANTGRD RTRGD (ADAPTER) ×2
ADPR TBG 2 MALE LL ART (MISCELLANEOUS) ×4
BAG DECANTER FOR FLEXI CONT (MISCELLANEOUS) ×2 IMPLANT
BLADE CLIPPER SURG (BLADE) ×2 IMPLANT
BLADE MICRO SHARP 3 15 DEG (BLADE) ×2 IMPLANT
BLADE STERNUM SYSTEM 6 (BLADE) ×2 IMPLANT
BLADE SURG 11 STRL SS (BLADE) IMPLANT
BLADE SURG 15 STRL LF DISP TIS (BLADE) IMPLANT
BLADE SURG 15 STRL SS (BLADE) ×2
BNDG ELASTIC 4X5.8 VLCR STR LF (GAUZE/BANDAGES/DRESSINGS) ×2 IMPLANT
BNDG ELASTIC 6X5.8 VLCR STR LF (GAUZE/BANDAGES/DRESSINGS) ×2 IMPLANT
BNDG GAUZE DERMACEA FLUFF 4 (GAUZE/BANDAGES/DRESSINGS) ×2 IMPLANT
BNDG GZE DERMACEA 4 6PLY (GAUZE/BANDAGES/DRESSINGS) ×2
BOOT SUTURE AID YELLOW STND (SUTURE) ×2 IMPLANT
CANISTER SUCT 3000ML PPV (MISCELLANEOUS) ×2 IMPLANT
CANNULA AORTIC ROOT 9FR (CANNULA) IMPLANT
CANNULA MC2 2 STG 36/46 NON-V (CANNULA) IMPLANT
CANNULA NON VENT 22FR 12 (CANNULA) ×2 IMPLANT
CANNULA VENOUS 2 STG 34/46 (CANNULA) ×2
CANNULA VESSEL 3MM BLUNT TIP (CANNULA) IMPLANT
CATH HEART VENT LEFT (CATHETERS) ×2 IMPLANT
CATH RETROPLEGIA CORONARY 14FR (CATHETERS) ×2 IMPLANT
CATH ROBINSON RED A/P 18FR (CATHETERS) ×6 IMPLANT
CATH THOR STR 32F SOFT 20 RADI (CATHETERS) ×4 IMPLANT
CATH THORACIC 28FR RT ANG (CATHETERS) ×2 IMPLANT
CLIP TI LARGE 6 (CLIP) ×2 IMPLANT
CLIP VESOCCLUDE MED 24/CT (CLIP) IMPLANT
CLIP VESOCCLUDE SM WIDE 24/CT (CLIP) IMPLANT
CNTNR URN SCR LID CUP LEK RST (MISCELLANEOUS) IMPLANT
CONT SPEC 4OZ STRL OR WHT (MISCELLANEOUS) ×10
CONTAINER PROTECT SURGISLUSH (MISCELLANEOUS) ×6 IMPLANT
COUNTER NEEDLE 20 DBL MAG RED (NEEDLE) IMPLANT
DERMABOND ADVANCED .7 DNX12 (GAUZE/BANDAGES/DRESSINGS) IMPLANT
DEVICE SUT CK QUICK LOAD INDV (Prosthesis & Implant Heart) IMPLANT
DEVICE SUT CK QUICK LOAD MINI (Prosthesis & Implant Heart) IMPLANT
DRAPE CARDIOVASCULAR INCISE (DRAPES) ×2
DRAPE CV SPLIT W-CLR ANES SCRN (DRAPES) ×2 IMPLANT
DRAPE INCISE IOBAN 66X45 STRL (DRAPES) ×2 IMPLANT
DRAPE PERI GROIN 82X75IN TIB (DRAPES) ×2 IMPLANT
DRAPE SRG 135X102X78XABS (DRAPES) ×2 IMPLANT
DRAPE WARM FLUID 44X44 (DRAPES) ×2 IMPLANT
DRESSING AQUACEL AG ADV 3.5X12 (MISCELLANEOUS) ×2 IMPLANT
DRSG AQUACEL AG ADV 3.5X10 (GAUZE/BANDAGES/DRESSINGS) IMPLANT
DRSG AQUACEL AG ADV 3.5X12 (MISCELLANEOUS)
DRSG AQUACEL AG ADV 3.5X14 (GAUZE/BANDAGES/DRESSINGS) ×2 IMPLANT
ELECT BLADE 4.0 EZ CLEAN MEGAD (MISCELLANEOUS) ×2
ELECT CAUTERY BLADE 6.4 (BLADE) ×2 IMPLANT
ELECT REM PT RETURN 9FT ADLT (ELECTROSURGICAL) ×8
ELECT SOLID GEL RDN PRO-PADZ (MISCELLANEOUS) ×2
ELECTRODE BLDE 4.0 EZ CLN MEGD (MISCELLANEOUS) ×2 IMPLANT
ELECTRODE REM PT RTRN 9FT ADLT (ELECTROSURGICAL) ×4 IMPLANT
ELECTRODE SOLI GEL RDN PROPADZ (MISCELLANEOUS) IMPLANT
FELT TEFLON 1X6 (MISCELLANEOUS) ×4 IMPLANT
GAUZE 4X4 16PLY ~~LOC~~+RFID DBL (SPONGE) IMPLANT
GAUZE SPONGE 4X4 12PLY STRL (GAUZE/BANDAGES/DRESSINGS) ×4 IMPLANT
GLOVE BIO SURGEON STRL SZ 6.5 (GLOVE) IMPLANT
GLOVE BIOGEL PI IND STRL 6.5 (GLOVE) IMPLANT
GLOVE BIOGEL PI IND STRL 7.5 (GLOVE) IMPLANT
GLOVE ECLIPSE 7.5 STRL STRAW (GLOVE) IMPLANT
GLOVE SS BIOGEL STRL SZ 6 (GLOVE) IMPLANT
GLOVE SURG SS PI 7.5 STRL IVOR (GLOVE) IMPLANT
GOWN STRL REUS W/ TWL LRG LVL3 (GOWN DISPOSABLE) ×12 IMPLANT
GOWN STRL REUS W/TWL LRG LVL3 (GOWN DISPOSABLE) ×12
HEMOSTAT POWDER SURGIFOAM 1G (HEMOSTASIS) ×6 IMPLANT
HEMOSTAT SURGICEL 2X14 (HEMOSTASIS) ×2 IMPLANT
INSERT FOGARTY 61MM (MISCELLANEOUS) IMPLANT
INSERT SUTURE HOLDER (MISCELLANEOUS) IMPLANT
IV ADAPTER SYR DOUBLE MALE LL (MISCELLANEOUS) IMPLANT
KIT BASIN OR (CUSTOM PROCEDURE TRAY) ×2 IMPLANT
KIT CATH CPB BARTLE (MISCELLANEOUS) ×2 IMPLANT
KIT SUCTION CATH 14FR (SUCTIONS) ×2 IMPLANT
KIT SUT CK MINI COMBO 4X17 (Prosthesis & Implant Heart) IMPLANT
KIT TURNOVER KIT B (KITS) ×2 IMPLANT
KIT VASOVIEW HEMOPRO 2 VH 4000 (KITS) ×2 IMPLANT
KNIFE MICRO-UNI 3.5 30 DEG (BLADE) IMPLANT
LINE VENT (MISCELLANEOUS) IMPLANT
NS IRRIG 1000ML POUR BTL (IV SOLUTION) ×10 IMPLANT
ORGANIZER SUTURE GABBAY-FRATER (MISCELLANEOUS) IMPLANT
PACK E OPEN HEART (SUTURE) ×2 IMPLANT
PACK OPEN HEART (CUSTOM PROCEDURE TRAY) ×2 IMPLANT
PAD ARMBOARD 7.5X6 YLW CONV (MISCELLANEOUS) ×4 IMPLANT
PAD ELECT DEFIB RADIOL ZOLL (MISCELLANEOUS) ×2 IMPLANT
PENCIL BUTTON HOLSTER BLD 10FT (ELECTRODE) ×2 IMPLANT
POSITIONER HEAD DONUT 9IN (MISCELLANEOUS) ×2 IMPLANT
PUNCH AORTIC ROTATE 4.0MM (MISCELLANEOUS) ×2 IMPLANT
PUNCH AORTIC ROTATE 4.5MM 8IN (MISCELLANEOUS) ×2 IMPLANT
SET MPS 3-ND DEL (MISCELLANEOUS) IMPLANT
SET Y-VENT ADPTR 7.5 MALE LUER (ADAPTER) IMPLANT
SOL PREP POV-IOD 4OZ 10% (MISCELLANEOUS) IMPLANT
SOL SCRUB PVP POV-IOD 4OZ 7.5% (MISCELLANEOUS) ×2
SOLUTION SCRB POV-IOD 4OZ 7.5% (MISCELLANEOUS) IMPLANT
SPONGE T-LAP 18X18 ~~LOC~~+RFID (SPONGE) IMPLANT
SPONGE T-LAP 4X18 ~~LOC~~+RFID (SPONGE) IMPLANT
STOPCOCK 4 WAY LG BORE MALE ST (IV SETS) IMPLANT
SUPPORT HEART JANKE-BARRON (MISCELLANEOUS) ×2 IMPLANT
SUT BONE WAX W31G (SUTURE) ×2 IMPLANT
SUT EB EXC GRN/WHT 2-0 V-5 (SUTURE) ×4 IMPLANT
SUT ETHIBOND 2-0 30 1/2 V-5 (SUTURE) IMPLANT
SUT MNCRL AB 3-0 PS2 18 (SUTURE) IMPLANT
SUT MNCRL AB 4-0 PS2 18 (SUTURE) ×4 IMPLANT
SUT PROLENE 4 0 RB 1 (SUTURE) ×8
SUT PROLENE 4 0 SH DA (SUTURE) ×2 IMPLANT
SUT PROLENE 4-0 RB1 .5 CRCL 36 (SUTURE) ×2 IMPLANT
SUT PROLENE 6 0 C 1 30 (SUTURE) IMPLANT
SUT PROLENE 7 0 BV1 MDA (SUTURE) ×2 IMPLANT
SUT SILK 1 TIES 10X30 (SUTURE) IMPLANT
SUT SILK 2 0 SH (SUTURE) IMPLANT
SUT SILK 2 0 SH CR/8 (SUTURE) IMPLANT
SUT SILK 2 0 TIES 10X30 (SUTURE) IMPLANT
SUT SILK 4 0 TIE 10X30 (SUTURE) IMPLANT
SUT STEEL 6MS V (SUTURE) IMPLANT
SUT STEEL SZ 6 DBL 3X14 BALL (SUTURE) ×6 IMPLANT
SUT TEM PAC WIRE 2 0 SH (SUTURE) IMPLANT
SUT VIC AB 0 CTX 36 (SUTURE) ×4
SUT VIC AB 0 CTX36XBRD ANTBCTR (SUTURE) ×4 IMPLANT
SUT VIC AB 2-0 CT1 27 (SUTURE) ×6
SUT VIC AB 2-0 CT1 TAPERPNT 27 (SUTURE) ×4 IMPLANT
SYR BULB IRRIG 60ML STRL (SYRINGE) IMPLANT
SYSTEM SAHARA CHEST DRAIN ATS (WOUND CARE) ×2 IMPLANT
TAPE CLOTH SURG 4X10 WHT LF (GAUZE/BANDAGES/DRESSINGS) IMPLANT
TAPE PAPER 2X10 WHT MICROPORE (GAUZE/BANDAGES/DRESSINGS) IMPLANT
TOWEL GREEN STERILE (TOWEL DISPOSABLE) ×2 IMPLANT
TOWEL GREEN STERILE FF (TOWEL DISPOSABLE) ×2 IMPLANT
TRAY FOLEY SLVR 16FR TEMP STAT (SET/KITS/TRAYS/PACK) ×2 IMPLANT
TUBE CONNECTING 12X1/4 (SUCTIONS) IMPLANT
TUBING ART PRESS 48 MALE/FEM (TUBING) IMPLANT
TUBING LAP HI FLOW INSUFFLATIO (TUBING) ×2 IMPLANT
UNDERPAD 30X36 HEAVY ABSORB (UNDERPADS AND DIAPERS) ×2 IMPLANT
VALVE AORTIC SZ27 INSP/RESIL (Valve) IMPLANT
VENT LEFT HEART 12002 (CATHETERS) ×2
WATER STERILE IRR 1000ML POUR (IV SOLUTION) ×4 IMPLANT

## 2022-01-23 NOTE — Discharge Instructions (Signed)

## 2022-01-23 NOTE — Anesthesia Procedure Notes (Signed)
Procedure Name: Intubation Date/Time: 01/23/2022 7:54 AM  Performed by: Gaylene Brooks, CRNAPre-anesthesia Checklist: Patient identified, Emergency Drugs available, Suction available and Patient being monitored Patient Re-evaluated:Patient Re-evaluated prior to induction Oxygen Delivery Method: Circle System Utilized Preoxygenation: Pre-oxygenation with 100% oxygen Induction Type: IV induction Ventilation: Mask ventilation without difficulty, Two handed mask ventilation required and Oral airway inserted - appropriate to patient size Laryngoscope Size: Sabra Heck and 2 Grade View: Grade II Tube type: Oral Tube size: 8.0 mm Number of attempts: 1 Airway Equipment and Method: Stylet and Oral airway Placement Confirmation: ETT inserted through vocal cords under direct vision, positive ETCO2 and breath sounds checked- equal and bilateral Secured at: 24 cm Tube secured with: Tape Dental Injury: Teeth and Oropharynx as per pre-operative assessment

## 2022-01-23 NOTE — Anesthesia Procedure Notes (Signed)
Central Venous Catheter Insertion Performed by: Annye Asa, MD, anesthesiologist Start/End10/31/2023 6:51 AM, 01/23/2022 7:07 AM Patient location: Pre-op. Preanesthetic checklist: patient identified, IV checked, risks and benefits discussed, surgical consent, monitors and equipment checked, pre-op evaluation, timeout performed and anesthesia consent Position: supine Lidocaine 1% used for infiltration and patient sedated Hand hygiene performed , maximum sterile barriers used  and Seldinger technique used Catheter size: 8.5 Fr PA cath was placed.Sheath introducer Swan type:thermodilution Procedure performed using ultrasound guided technique. Ultrasound Notes:anatomy identified, needle tip was noted to be adjacent to the nerve/plexus identified, no ultrasound evidence of intravascular and/or intraneural injection and image(s) printed for medical record Attempts: 1 Following insertion, line sutured, dressing applied and Biopatch. Post procedure assessment: free fluid flow, blood return through all ports and no air  Post procedure complications: arrhythmia (transient PVCs). Patient tolerated the procedure well with no immediate complications. Additional procedure comments: PA catheter:  Routine monitors. Timeout, sterile prep, drape, FBP R neck. Supine position.  1% Lido local, finder and trocar RIJ 1st pass with US guidance.  Cordis placed over J wire. PA catheter in easily.  Sterile dressing applied.  Patient tolerated well, VSS.  Jenita Seashore, MD.

## 2022-01-23 NOTE — Anesthesia Procedure Notes (Signed)
Arterial Line Insertion Start/End10/31/2023 6:50 AM, 01/23/2022 7:00 AM Performed by: Gaylene Brooks, CRNA, CRNA  Patient location: Pre-op. Preanesthetic checklist: patient identified, IV checked, site marked, risks and benefits discussed, surgical consent, monitors and equipment checked, pre-op evaluation, timeout performed and anesthesia consent Left, radial was placed Catheter size: 20 G Hand hygiene performed  and maximum sterile barriers used  Allen's test indicative of satisfactory collateral circulation Attempts: 2 Procedure performed without using ultrasound guided technique. Following insertion, Biopatch and dressing applied. Post procedure assessment: normal  Patient tolerated the procedure well with no immediate complications.

## 2022-01-23 NOTE — Anesthesia Postprocedure Evaluation (Signed)
Anesthesia Post Note  Patient: Malik [name]  Procedure(s) Performed: AORTIC VALVE REPLACEMENT (AVR) USING 27MM INSPIRIS RESILIA  AORTIC VALVE (Chest) CORONARY ARTERY BYPASS GRAFTING (CABGX1) USING ENDOSCOPICALLY HARVESTED RIGHT GREATER SAPHENOUS VEIN (Chest) TRANSESOPHAGEAL ECHOCARDIOGRAM (TEE)     Patient location during evaluation: SICU Anesthesia Type: General Level of consciousness: sedated, patient cooperative and patient remains intubated per anesthesia plan (responds to commands) Pain management: pain level controlled Vital Signs Assessment: post-procedure vital signs reviewed and stable Respiratory status: patient remains intubated per anesthesia plan and patient on ventilator - see flowsheet for VS (weaning from vent) Cardiovascular status: stable (on Phenylephrine infusion) Postop Assessment: no apparent nausea or vomiting Anesthetic complications: no   No notable events documented.  Last Vitals:  Vitals:   01/23/22 1500 01/23/22 1515  BP:    Pulse: 80 80  Resp: 15 18  Temp: 36.6 C 36.8 C  SpO2: 100% 100%    Last Pain:  Vitals:   01/23/22 0625  TempSrc: Oral                 Onisha Cedeno,E. Kayana Thoen

## 2022-01-23 NOTE — Hospital Course (Addendum)
History of Present Illness:     Pt is a very pleasant 71 yo wm who has known AS from an original heart murmur work up about 10 yrs ago. Pt has had recently an increase in his symptoms of exertional CP and DOE that both resolve with rest for the past year or so. He feels somewhat worse the past several months. He has no lightheadedness nor syncope or palpitations. He was seen and underwent echo with now severe AS with a mean gradient of 44 mmHg and with a peak of 80mmHg. He has normal LV function with an EF of 65%. He underwent cath with a high grade 80% stenosis of the ostium of the RCA and also 70% of a distal AV groove OM. He underwent TAVR CTA which has adequate anatomy for TAVR but aortic valve replacement with coronary bypass grafting was felt to be the most beneficial approach.   Course in Hospital: Malik [name] was admitted for elective surgery and taken to the OR where CABG x 1 with saphenous vein grafted to the right coronary artery and aortic valve replacement with a 1mm Inspiris Resilia bioprosthetic valve were carried out without complication.   Following the procedures, he separated from cardiopulmonary bypass without difficulty and was transferred to the ICU in stable condition.  The patient was extubated the evening of surgery.  He was weaned off Neo-synephrine as hemodynamics allowed.  His swan ganz catheter was removed without difficulty. He was started on Lasix for volume overload.  The patient's pacing wires were removed without difficulty.  His chest tubes were removed without difficulty.  He was in NSR but was experiencing frequent PVCs.  His Lopressor dose was increased and he was started on Amiodarone.  He was started on IV diuretics for volume overloaded state.  His central line was removed and he was felt stable for transfer to the progressive care unit in stable condition.

## 2022-01-23 NOTE — Consult Note (Addendum)
NAME:  Malik [name], MRN:  [id number], DOB:  12-24-1950, LOS: 0 ADMISSION DATE:  01/23/2022, CONSULTATION DATE: 01/23/2022 REFERRING MD: Dr. Lavonna Monarch, CHIEF COMPLAINT: S/p CABG and AVR  History of Present Illness:  71 year old male with coronary artery disease, severe aortic stenosis, hypertension and hyperlipidemia who presented with increasing exertional chest pain and shortness of breath which has been getting worse for the past few months.  Patient denies dizziness, headache, vision problem or any other complaint.  On work-up patient was noted to have severe aortic stenosis and RCA stenosis.  Today patient underwent aortic valve replacement and CABG x1.  PCCM was consulted for help evaluation and medical management  Pertinent  Medical History   Past Medical History:  Diagnosis Date   Aortic stenosis    Arthritis    ASHD (arteriosclerotic heart disease)    Chest pressure    COVID-19 virus infection 11/2019   x 3   GERD (gastroesophageal reflux disease)    Heart murmur    History of kidney stones    Hyperglycemia    Hyperlipidemia    Hypertension    Nephrolithiasis    Obesity      Significant Hospital Events: Including procedures, antibiotic start and stop dates in addition to other pertinent events     Interim History / Subjective:    Objective   Blood pressure 97/66, pulse 80, temperature 98.2 F (36.8 C), resp. rate (!) 27, SpO2 99 %. PAP: (20-28)/(10-19) 22/17 CVP:  [2 mmHg-12 mmHg] 8 mmHg CO:  [3.8 L/min-6.2 L/min] 6.2 L/min CI:  [1.5 L/min/m2-2.4 L/min/m2] 2.4 L/min/m2  Vent Mode: SIMV;PRVC;PSV FiO2 (%):  [40 %-50 %] 40 % Set Rate:  [4 bmp-12 bmp] 4 bmp Vt Set:  [620 mL] 620 mL PEEP:  [5 cmH20] 5 cmH20 Pressure Support:  [10 cmH20] 10 cmH20 Plateau Pressure:  [14 cmH20-18 cmH20] 18 cmH20   Intake/Output Summary (Last 24 hours) at 01/23/2022 1614 Last data filed at 01/23/2022 1300 Gross per 24 hour  Intake 3611.15 ml  Output 2319 ml  Net 1292.15 ml    There were no vitals filed for this visit.  Examination:   Physical exam: General: Crtitically ill-appearing morbidly obese male, orally intubated HEENT: [place]/AT, eyes anicteric.  ETT and OGT in place Neuro: Sedated, not following commands.  Eyes are closed.  Pupils 3 mm bilateral reactive to light Chest: Sternotomy incision looks clean and dry coarse breath sounds, no wheezes or rhonchi.  Chest tubes are in place with some blood tinged drainage Heart: Regular rate and rhythm, no murmurs or gallops Abdomen: Soft, nontender, nondistended, bowel sounds present Skin: No rash  Resolved Hospital Problem list     Assessment & Plan:  Coronary artery disease s/p CABG Severe aortic stenosis s/p AVR Acute respiratory insufficiency, postop Hypertension Hyperlipidemia Morbid obesity Expected acute blood loss anemia/thrombocytopenia, postprocedure   Continue aspirin and statin Chest tube management TCTS Continue to titrate Precedex with RASS goal 0/-1 Monitor H&H Rapid weaning protocol ordered is in place Continue pain control with tramadol, oxycodone and fentanyl Holding antihypertensive for now, as patient is requiring low-dose phenylephrine Continue Protonix Monitor urine output and labs Diet and exercise counseling when able to Monitor H&H and platelet count   Best Practice (right click and "Reselect all SmartList Selections" daily)   Diet/type: NPO DVT prophylaxis: SCD GI prophylaxis: PPI Lines: Central line and yes and it is still needed Foley:  Yes, and it is still needed Code Status:  full code Last date of multidisciplinary goals  of care discussion [Per primary team]  Labs   CBC: Recent Labs  Lab 01/19/22 1200 01/23/22 0817 01/23/22 1002 01/23/22 1025 01/23/22 1115 01/23/22 1229 01/23/22 1236  WBC 6.9  --   --   --   --  16.1*  --   HGB 15.3   < > 11.6* 12.3* 11.2* 12.8* 11.9*  HCT 44.7   < > 34.0* 35.4* 33.0* 37.4* 35.0*  MCV 91.4  --   --   --   --  91.9   --   PLT 163  --   --  150  --  131*  --    < > = values in this interval not displayed.    Basic Metabolic Panel: Recent Labs  Lab 01/19/22 1200 01/23/22 0817 01/23/22 0906 01/23/22 0935 01/23/22 0940 01/23/22 1002 01/23/22 1115 01/23/22 1236  NA 139 140 139 138 139 138 138 140  K 3.8 3.8 3.8 5.7* 4.7 4.3 3.9 3.6  CL 108 101 102  --   --  102 102  --   CO2 23  --   --   --   --   --   --   --   GLUCOSE 93 125* 128*  --   --  155* 197*  --   BUN 12 10 9   --   --  9 10  --   CREATININE 0.78 0.70 0.70  --   --  0.60* 0.70  --   CALCIUM 9.3  --   --   --   --   --   --   --    GFR: Estimated Creatinine Clearance: 119.8 mL/min (by C-G formula based on SCr of 0.7 mg/dL). Recent Labs  Lab 01/19/22 1200 01/23/22 1229  WBC 6.9 16.1*    Liver Function Tests: Recent Labs  Lab 01/19/22 1200  AST 18  ALT 24  ALKPHOS 53  BILITOT 0.9  PROT 6.7  ALBUMIN 3.8   No results for input(s): "LIPASE", "AMYLASE" in the last 168 hours. No results for input(s): "AMMONIA" in the last 168 hours.  ABG    Component Value Date/Time   PHART 7.309 (L) 01/23/2022 1236   PCO2ART 45.2 01/23/2022 1236   PO2ART 88 01/23/2022 1236   HCO3 22.9 01/23/2022 1236   TCO2 24 01/23/2022 1236   ACIDBASEDEF 4.0 (H) 01/23/2022 1236   O2SAT 96 01/23/2022 1236     Coagulation Profile: Recent Labs  Lab 01/19/22 1200 01/23/22 1229  INR 1.1 1.3*    Cardiac Enzymes: No results for input(s): "CKTOTAL", "CKMB", "CKMBINDEX", "TROPONINI" in the last 168 hours.  HbA1C: Hgb A1c MFr Bld  Date/Time Value Ref Range Status  01/19/2022 11:42 AM 5.9 (H) 4.8 - 5.6 % Final    Comment:    (NOTE) Pre diabetes:          5.7%-6.4%  Diabetes:              >6.4%  Glycemic control for   <7.0% adults with diabetes   04/04/2017 09:41 AM 6.4 (H) 4.8 - 5.6 % Final    Comment:             Prediabetes: 5.7 - 6.4          Diabetes: >6.4          Glycemic control for adults with diabetes: <7.0      CBG: Recent Labs  Lab 01/23/22 1234 01/23/22 1313 01/23/22 1403 01/23/22 1502 01/23/22 1602  GLUCAP 158* 142*  128* 135* 146*    Review of Systems:   Unable to obtain as patient intubated and sedated  Past Medical History:  He,  has a past medical history of Aortic stenosis, Arthritis, ASHD (arteriosclerotic heart disease), Chest pressure, COVID-19 virus infection (11/2019), GERD (gastroesophageal reflux disease), Heart murmur, History of kidney stones, Hyperglycemia, Hyperlipidemia, Hypertension, Nephrolithiasis, and Obesity.   Surgical History:   Past Surgical History:  Procedure Laterality Date   APPENDECTOMY     CIRCUMCISION N/A 11/03/2019   Procedure: CIRCUMCISION ADULT;  Surgeon: Irine Seal, MD;  Location: [facility];  Service: Urology;  Laterality: N/A;   RIGHT/LEFT HEART CATH AND CORONARY ANGIOGRAPHY N/A 11/29/2021   Procedure: RIGHT/LEFT HEART CATH AND CORONARY ANGIOGRAPHY;  Surgeon: Sherren Mocha, MD;  Location: [facility];  Service: Cardiovascular;  Laterality: N/A;     Social History:   reports that he quit smoking about 43 years ago. His smoking use included cigarettes and cigars. He has quit using smokeless tobacco. He reports that he does not drink alcohol and does not use drugs.   Family History:  His family history includes Cancer in his brother and mother; Heart disease in his brother and father.   Allergies No Known Allergies   Home Medications  Prior to Admission medications   Medication Sig Start Date End Date Taking? Authorizing Provider  aspirin EC 81 MG tablet Take 81 mg by mouth at bedtime.   Yes [provider]  latanoprost (XALATAN) 0.005 % ophthalmic solution Place 1 drop into both eyes at bedtime. 09/21/21  Yes [provider]  lisinopril-hydrochlorothiazide (PRINZIDE,ZESTORETIC) 20-25 MG tablet Take 1 tablet by mouth daily. Patient taking differently: Take 1 tablet by mouth at bedtime. 04/04/17  Yes Wendie Agreste,  MD  metoprolol succinate (TOPROL-XL) 100 MG 24 hr tablet Take 1 tablet (100 mg total) by mouth daily. Take with or immediately following a meal. Patient taking differently: Take 100 mg by mouth at bedtime. Take with or immediately following a meal. 04/04/17  Yes Wendie Agreste, MD  simvastatin (ZOCOR) 80 MG tablet TAKE 1 TABLET(80 MG) BY MOUTH AT BEDTIME 04/04/17  Yes Wendie Agreste, MD     Critical care time:      Total critical care time: 37 minutes  Performed by: [place] care time was exclusive of separately billable procedures and treating other patients.   Critical care was necessary to treat or prevent imminent or life-threatening deterioration.   Critical care was time spent personally by me on the following activities: development of treatment plan with patient and/or surrogate as well as nursing, discussions with consultants, evaluation of patient's response to treatment, examination of patient, obtaining history from patient or surrogate, ordering and performing treatments and interventions, ordering and review of laboratory studies, ordering and review of radiographic studies, pulse oximetry and re-evaluation of patient's condition.   Jacky Kindle, MD [facility] See Amion for pager If no response to pager, please call [phone] until 7pm After 7pm, Please call E-link [phone]

## 2022-01-23 NOTE — Brief Op Note (Signed)
01/23/2022  10:51 AM  PATIENT:  Idelle Crouch  71 y.o. male  PRE-OPERATIVE DIAGNOSIS:  CORONARY ARTERY DISEASE, SEVERE AORTIC STENOSIS  POST-OPERATIVE DIAGNOSIS:  CORONARY ARTERY DISEASE, SEVERE AORTIC STENOSIS  PROCEDURE:   AORTIC VALVE REPLACEMENT with 47mm Inspiris Resilia Pericardial Tissue Valve  CORONARY ARTERY BYPASS GRAFTING:  SVG-->RCA Vein harvest time: 8min Vein prep time: 6min  TRANSESOPHAGEAL ECHOCARDIOGRAM    SURGEON:  Coralie Common, MD - Primary  PHYSICIAN ASSISTANT: Markes Shatswell  ASSISTANTS: Melody Comas, RN, Scrub Person         Slusser, Rosann Auerbach, RN, Scrub Person   ANESTHESIA:   general  EBL:  176ml   BLOOD ADMINISTERED:none  DRAINS:  Mediastinal tubes    LOCAL MEDICATIONS USED:  NONE  SPECIMEN:  aortic valve leaflets  DISPOSITION OF SPECIMEN:  PATHOLOGY  COUNTS:  Correct  DICTATION: .Dragon Dictation  PLAN OF CARE: Admit to inpatient   PATIENT DISPOSITION:  ICU - intubated and critically ill.   Delay start of Pharmacological VTE agent (>24hrs) due to surgical blood loss or risk of bleeding: yes

## 2022-01-23 NOTE — Procedures (Signed)
Extubation Procedure Note  Patient Details:   Name: Malik [name] DOB: [birth year] MRN: [id number]   Airway Documentation:    Vent end date: 01/23/22 Vent end time: 1602   Evaluation  O2 sats: stable throughout Complications: No apparent complications Patient did tolerate procedure well. Bilateral Breath Sounds: Clear, Diminished   Yes Pt was extubated successfully with no apparent complications. NIF -35,VC 1.1L. Vte was noted during cuffleak check and no signs of stridor at this time. Pt is able to state name and speak. Pt is on 4L [place] and vital signs are stable. RT will monitor as needed.   Felecia Jan 01/23/2022, 4:03 PM

## 2022-01-23 NOTE — Interval H&P Note (Signed)
History and Physical Interval Note:  01/23/2022 6:32 AM  Malik [name]  has presented today for surgery, with the diagnosis of CAD SEVERE AS.  The various methods of treatment have been discussed with the patient and family. After consideration of risks, benefits and other options for treatment, the patient has consented to  Procedure(s): AORTIC VALVE REPLACEMENT (AVR) (N/A) CORONARY ARTERY BYPASS GRAFTING (CABG) (N/A) TRANSESOPHAGEAL ECHOCARDIOGRAM (TEE) (N/A) as a surgical intervention.  The patient's history has been reviewed, patient examined, no change in status, stable for surgery.  I have reviewed the patient's chart and labs.  Questions were answered to the patient's satisfaction.     Coralie Common

## 2022-01-23 NOTE — Transfer of Care (Signed)
Immediate Anesthesia Transfer of Care Note  Patient: Malik [name]  Procedure(s) Performed: AORTIC VALVE REPLACEMENT (AVR) USING 27MM INSPIRIS RESILIA  AORTIC VALVE (Chest) CORONARY ARTERY BYPASS GRAFTING (CABGX1) USING ENDOSCOPICALLY HARVESTED RIGHT GREATER SAPHENOUS VEIN (Chest) TRANSESOPHAGEAL ECHOCARDIOGRAM (TEE)  Patient Location: SICU  Anesthesia Type:General  Level of Consciousness: sedated and Patient remains intubated per anesthesia plan  Airway & Oxygen Therapy: Patient remains intubated per anesthesia plan and Patient placed on Ventilator (see vital sign flow sheet for setting)  Post-op Assessment: Report given to RN, Post -op Vital signs reviewed and stable, and Patient moving all extremities X 4  Post vital signs: Reviewed and stable  Last Vitals:  Vitals Value Taken Time  BP    Temp    Pulse 80 01/23/22 1223  Resp 12 01/23/22 1223  SpO2 93 % 01/23/22 1223  Vitals shown include unvalidated device data.  Last Pain:  Vitals:   01/23/22 0625  TempSrc: Oral         Complications: No notable events documented.

## 2022-01-23 NOTE — Op Note (Signed)
CARDIOVASCULAR SURGERY OPERATIVE NOTE  01/23/2022 ABDULAH IQBAL [id number]  Surgeon:  Ashley Akin, MD  First Assistant: Jillyn Hidden [facility]                                 An experienced assistant was required given the complexity of this surgery and the standard of surgical care. The assistant was needed for exposure, dissection, suctioning, retraction of delicate tissues and sutures, instrument exchange and for overall help during this procedure.     Preoperative Diagnosis:  Severe aortic stenosis                                             Coronary artery disease  Postoperative Diagnosis:  Same   Procedure:  Median Sternotomy Extracorporeal circulation 3.   Aortic valve replacement using a 52mm  Inspiris Pericardial valve. [facility] [id number])  Anesthesia:  Jairo Ben MD General Endotracheal   Clinical History/Surgical Indication:  Pt is a very pleasant 71 yo wm who has known AS from an original heart murmur work up about 10 yrs ago. Pt has had recently an increase in his symptoms of exertional CP and DOE that both resolve with rest for the past year or so. He feels somewhat worse the past several months. He has no lightheadedness nor syncope or palpitations. He was seen and underwent echo with now severe AS with a mean gradient of 44 mmHg and with a peak of . He has normal LV function with an EF of 65%. He underwent cath with a high grade 80% stenosis of the ostium of the RCA and also 70% of a distal AV groove OM. He underwent TAVR CTA which has adequate anatomy for TAVR.    Preparation:  The patient was seen in the preoperative holding area and the correct patient, correct operation were confirmed with the patient after reviewing the medical record and catheterization. The consent was signed by me. Preoperative antibiotics were given. A pulmonary arterial line and radial arterial line were placed by the anesthesia team. The patient was taken back to the operating  room and positioned supine on the operating room table. After being placed under general endotracheal anesthesia by the anesthesia team a foley catheter was placed. The neck, chest, abdomen, and both legs were prepped with betadine soap and solution and draped in the usual sterile manner. A surgical time-out was taken and the correct patient and operative procedure were confirmed with the nursing and anesthesia staff.   Pre-bypass TEE:   Complete TEE assessment was performed by Dr. Jairo Ben. Which showed normal LV function and severe AS    Post-bypass TEE:   Normal functioning prosthetic aortic valve with no perivalvular leak or regurgitation through the valve. Left ventricular function preserved. mild mitral regurgitation.mean gradient  Findings   Media Information      Cardiopulmonary Bypass:  A median sternotomy was performed. The pericardium was opened in the midline. Right ventricular function appeared normal. The ascending aorta was of normal size and had no palpable plaque. There were no contraindications to aortic cannulation or cross-clamping. The patient was fully systemically heparinized and the ACT was maintained > 400 sec. The proximal aortic arch was cannulated with a 22 F aortic cannula for arterial inflow. Venous cannulation was performed via the right atrial appendage using  a two-staged venous cannula. An antegrade cardioplegia/vent cannula was inserted into the mid-ascending aorta. A left ventricular vent was placed via the right superior pulmonary vein. Aortic occlusion was performed with a single cross-clamp.  topical cooling of the heart with iced saline were used. KBC cardioplegia was given antegrade for the prescribed dos e and a reanimation dose given just prior to cross clamp removal. Carbon dioxide was insufflated into the pericardium at 5L/min throughout the procedure to minimize intracardiac air.  Coronary artery bypass From the right lower leg the  PA performed saphenous vein harvesting via the Endo saphenous excision system.  This wound was closed in multiple layers observable suture.  The right coronary artery in the midsection was chosen for grafting and an arteriotomy performed and end-to-side anastomosis was performed to these reverse saphenous vein graft.  Was flushed de-aired and found to be hemostatic.  Aortic Valve Replacement:   A transverse aortotomy was performed 1 cm above the take-off of the right coronary artery. The native valve was tricuspid with calcified leaflets and had moderate  annular calcification. The ostia of the coronary arteries were in normal position and were not obstructed. The native valve leaflets were excised and the annulus was decalcified with rongeurs. Care was taken to remove all particulate debris. The left ventricle was directly inspected for debris and then irrigated with ice saline solution. The annulus was sized and a size 35mm Inspiris  pericardial  valve was chosen. While the valve was being prepared 2-0 Ethibond pledgeted horizontal mattress sutures were placed around the annulus with the pledgets in a sub-annular position. The sutures were placed through the sewing ring and the valve lowered into place. The sutures were tied sequentially with the coreknot system .The valve seated nicely and the coronary ostia were not obstructed. The prosthetic valve leaflets moved normally and there was no sub-valvular obstruction. The aortotomy was closed using 4-0 Prolene suture in 2 layers.  The proximal vein graft was brought off the aorta through a 4.0 mm aortic punch.  This performed a running 6-0 Prolene suture and a proximal vein graft marker was utilized.  The patient in headdown position aortic cross-clamp was removed following a reanimation dose of given antegrade.  Completion:  The patient was rewarmed to 37 degrees Centigrade. De-airing maneuvers were performed and the head placed in trendelenburg position.  The crossclamp was removed with a time of 80 minutes. There was spontaneous return of sinus rhythm. The aortotomy was checked for hemostasis. Two temporary epicardial pacing wires were placed on the right atrium and two on the right ventricle. The left ventricular vent and retrograde cardioplegia cannulas were removed. The patient was weaned from CPB without difficulty on no inotropes. CPB time was 98 minutes.  Heparin was fully reversed with protamine and the aortic and venous cannulas removed. Hemostasis was achieved. Mediastinal drainage tubes were placed. The sternum was closed with double #6 stainless steel wires. The fascia was closed with continuous # 1 vicryl suture. The subcutaneous tissue was closed with 2-0 vicryl continuous suture. The skin was closed with 3-0 vicryl subcuticular suture. All sponge, needle, and instrument counts were reported correct at the end of the case. Dry sterile dressings were placed over the incisions and around the chest tubes which were connected to pleurevac suction. The patient was then transported to the surgical intensive care unit in critical but stable condition.

## 2022-01-23 NOTE — Discharge Summary (Signed)
Physician Discharge Summary  Patient ID: Malik [name] MRN: [id number] DOB/AGE: March 14, 1951 71 y.o.  Admit date: 01/23/2022 Discharge date: 01/28/2022  Admission Diagnoses:  Severe aortic stenosis Coronary artery disease Hypertension Hyperlipidemia Pulmonary nodule  Discharge Diagnoses:   Severe aortic stenosis Coronary artery disease S/P aortic valve replacement S/P single-vessel coronary artery bypass grafting Hypertension Hyperlipidemia Pulmonary nodule Frequent PVC's   Discharged Condition: stable  History of Present Illness:     Pt is a very pleasant 71 yo wm who has known AS from an original heart murmur work up about 10 yrs ago. Pt has had recently an increase in his symptoms of exertional CP and DOE that both resolve with rest for the past year or so. He feels somewhat worse the past several months. He has no lightheadedness nor syncope or palpitations. He was seen and underwent echo with now severe AS with a mean gradient of 44 mmHg and with a peak of 3mHg. He has normal LV function with an EF of 65%. He underwent cath with a high grade 80% stenosis of the ostium of the RCA and also 70% of a distal AV groove OM. He underwent TAVR CTA which has adequate anatomy for TAVR but aortic valve replacement with coronary bypass grafting was felt to be the most beneficial approach.   Course in Hospital: Mr. WCoykendallwas admitted for elective surgery and taken to the OR where CABG x 1 with saphenous vein grafted to the right coronary artery and aortic valve replacement with a 241mInspiris Resilia bioprosthetic valve were carried out without complication.   Following the procedures, he separated from cardiopulmonary bypass without difficulty and was transferred to the ICU in stable condition.  The patient was extubated the evening of surgery.  He was weaned off Neo-synephrine as hemodynamics allowed.  His swan ganz catheter was removed without difficulty. He was started on Lasix  for volume overload.  The patient's pacing wires were removed without difficulty.  His chest tubes were removed without difficulty.  He was in NSR but was experiencing frequent PVCs.  His Lopressor dose was increased and he was started on Amiodarone.  He was started on IV diuretics for volume overloaded state.  His central line was removed and he was felt stable for transfer to the progressive care unit but he remained in the ICU until discharge due to bed availability. He remained in stable ST on amiodarone that was started for frequent PVC's despite being on a beta blocker. He regained independence with mobility and was felt to be ready for discharge to home on 01/28/22.  The incision was healing with no sign of complication.   Consults: cardiology and pulmonary/intensive care  Significant Diagnostic Studies: cardiac graphics:   Echocardiogram:      IMPRESSIONS     1. Left ventricular ejection fraction, by estimation, is 60 to 65%. The  left ventricle has normal function. The left ventricle has no regional  wall motion abnormalities. Left ventricular diastolic parameters were  normal.   2. Right ventricular systolic function is normal. The right ventricular  size is normal.   3. Left atrial size was moderately dilated.   4. The mitral valve is normal in structure. No evidence of mitral valve  regurgitation. No evidence of mitral stenosis.   5. The aortic valve is tricuspid. There is severe calcifcation of the  aortic valve. There is moderate thickening of the aortic valve. Aortic  valve regurgitation is not visualized. Severe aortic valve stenosis.   6.  Aortic dilatation noted. There is mild dilatation of the aortic root,  measuring 39 mm. There is moderate dilatation of the ascending aorta,  measuring 43 mm.   7. The inferior vena cava is normal in size with greater than 50%  respiratory variability, suggesting right atrial pressure of 3 mmHg.   Angiography:     Ost RCA to Prox RCA  lesion is 80% stenosed.   Prox LAD to Mid LAD lesion is 30% stenosed.   Prox Cx to Mid Cx lesion is 70% stenosed.   There is severe aortic valve stenosis.   1.  Severe single-vessel coronary artery disease with severe ostial stenosis of the RCA and left-to-right collaterals supplying the distal RCA branches which fill both antegrade and via collaterals 2.  Patent left main and LAD without significant stenosis 3.  Patent left circumflex into a large first OM and moderately severe stenosis of the AV circumflex beyond the OM supplying a small myocardial territory 4.  Severe calcific aortic stenosis with mean transvalvular gradient 50 mmHg, peak to peak gradient 61 mmHg, calculated valve area 0.89 cm 5.  Normal right heart hemodynamics   Recommendations: Favor medical therapy for CAD, CT angiography studies for planning of aortic valve replacement via TAVR versus SAVR  Treatments: surgery:   01/23/2022 Malik [name] [id number]   Surgeon:  Everlena Cooper, MD   First Assistant: Enid Cutter [facility]                                 An experienced assistant was required given the complexity of this surgery and the standard of surgical care. The assistant was needed for exposure, dissection, suctioning, retraction of delicate tissues and sutures, instrument exchange and for overall help during this procedure.       Preoperative Diagnosis:  Severe aortic stenosis                                             Coronary artery disease   Postoperative Diagnosis:  Same     Procedure:   Median Sternotomy Extracorporeal circulation 3.   Aortic valve replacement using a 61m  Inspiris Pericardial valve. ([id number]    Discharge Exam: Blood pressure 128/69, pulse 72, temperature 98.2 F (36.8 C), temperature source Oral, resp. rate (!) 22, weight (!) 136.1 kg, SpO2 95 %.  General appearance: alert, cooperative, and no distress Neurologic: intact Heart: regular rate and rhythm Lungs:  diminished breath sounds bibasilar Abdomen: normal findings: soft, non-tender Wound: clean and dry  Disposition:   Discharge Instructions     Amb Referral to Cardiac Rehabilitation   Complete by: As directed    Diagnosis:  CABG Valve Replacement     Valve: Aortic Comment - 01/23/2022   CABG X ___: 1 Comment - 01/23/2022   After initial evaluation and assessments completed: Virtual Based Care may be provided alone or in conjunction with Phase 2 Cardiac Rehab based on patient barriers.: Yes   [facility] [place] OR Traditional Cardiac Rehabilitation (TCR) *If criteria for ICR are not met [facility] Yes      Allergies as of 01/28/2022   No Known Allergies      Medication List     STOP taking these medications    lisinopril-hydrochlorothiazide 20-25  MG tablet Commonly known as: ZESTORETIC   simvastatin 80 MG tablet Commonly known as: ZOCOR       TAKE these medications    acetaminophen 325 MG tablet Commonly known as: TYLENOL Take 2 tablets (650 mg total) by mouth every 4 (four) hours as needed.   amiodarone 200 MG tablet Commonly known as: PACERONE Take 1 tablet (200 mg total) by mouth 2 (two) times daily. For 14 days then reduce the dose to 1 tablet (200 mg) by mouth once daily.   aspirin EC 325 MG tablet Take 1 tablet (325 mg total) by mouth daily. What changed:  medication strength how much to take when to take this   atorvastatin 80 MG tablet Commonly known as: LIPITOR Take 1 tablet (80 mg total) by mouth daily.   furosemide 40 MG tablet Commonly known as: LASIX Take 1 tablet (40 mg total) by mouth daily for 5 days.   latanoprost 0.005 % ophthalmic solution Commonly known as: XALATAN Place 1 drop into both eyes at bedtime.   metoprolol succinate 100 MG 24 hr tablet Commonly known as: TOPROL-XL Take 1 tablet (100 mg total) by mouth daily. Take with or immediately following a meal. What  changed: when to take this   potassium chloride SA 20 MEQ tablet Commonly known as: KLOR-CON M Take 1 tablet (20 mEq total) by mouth daily for 5 days.   traMADol 50 MG tablet Commonly known as: ULTRAM Take 1 tablet (50 mg total) by mouth every 6 (six) hours as needed for up to 7 days for moderate pain.               Durable Medical Equipment  (From admission, onward)           Start     Ordered   01/27/22 1401  For home use only DME Walker rolling  Once       Question Answer Comment  Walker: With 5 Inch Wheels   Patient needs a walker to treat with the following condition S/P CABG x 1      01/27/22 1406            Follow-up Information     [facility] Follow up on 02/06/2022.   Specialty: Cardiothoracic Surgery Why: Appointment is at 3:00 Contact information: [place], [place] Follow up on 02/06/2022.   Why: Appointment is at 2:00 for chest xray Contact information: [place]        Marylu Lund., NP Follow up on 02/14/2022.   Specialty: Nurse Practitioner Why: Appointment is at 10:55 Contact information: [address] [place] [address] [phone]                 The patient has been discharged on:   1.Beta Blocker:  Yes [  x ]                              No   [   ]                              If No, reason:  2.Ace Inhibitor/ARB: Yes [   ]  No  [  x  ]                                     If No, reason: soft BP  3.Statin:   Yes [ y  ]                  No  [   ]                  If No, reason:  4.Ecasa:  Yes  [ y  ]                  No   [   ]                  If No, reason:  5. ACS on Admission? no  P2Y12 Inhibitor:  Yes  [   ]                                No  [ x ]     Signed: Antony Odea, PA-C 01/28/2022, 11:16 AM

## 2022-01-24 ENCOUNTER — Encounter (HOSPITAL_COMMUNITY): Payer: Self-pay | Admitting: Thoracic Surgery (Cardiothoracic Vascular Surgery)

## 2022-01-24 ENCOUNTER — Inpatient Hospital Stay (HOSPITAL_COMMUNITY): Payer: Medicare Other

## 2022-01-24 ENCOUNTER — Other Ambulatory Visit: Payer: Self-pay | Admitting: Cardiology

## 2022-01-24 DIAGNOSIS — Z952 Presence of prosthetic heart valve: Secondary | ICD-10-CM | POA: Diagnosis not present

## 2022-01-24 DIAGNOSIS — I2510 Atherosclerotic heart disease of native coronary artery without angina pectoris: Secondary | ICD-10-CM | POA: Diagnosis not present

## 2022-01-24 DIAGNOSIS — R0689 Other abnormalities of breathing: Secondary | ICD-10-CM | POA: Diagnosis not present

## 2022-01-24 DIAGNOSIS — I350 Nonrheumatic aortic (valve) stenosis: Secondary | ICD-10-CM

## 2022-01-24 LAB — MAGNESIUM: Magnesium: 2.4 mg/dL (ref 1.7–2.4)

## 2022-01-24 LAB — BASIC METABOLIC PANEL
Anion gap: 9 (ref 5–15)
BUN: 13 mg/dL (ref 8–23)
CO2: 23 mmol/L (ref 22–32)
Calcium: 8.4 mg/dL — ABNORMAL LOW (ref 8.9–10.3)
Chloride: 105 mmol/L (ref 98–111)
Creatinine, Ser: 0.87 mg/dL (ref 0.61–1.24)
GFR, Estimated: >60 mL/min (ref >60)
Glucose, Bld: 128 mg/dL — ABNORMAL HIGH (ref 70–99)
Potassium: 4 mmol/L (ref 3.5–5.1)
Sodium: 137 mmol/L (ref 135–145)

## 2022-01-24 LAB — SURGICAL PATHOLOGY

## 2022-01-24 LAB — GLUCOSE, CAPILLARY
Glucose-Capillary: 126 mg/dL — ABNORMAL HIGH (ref 70–99)
Glucose-Capillary: 127 mg/dL — ABNORMAL HIGH (ref 70–99)
Glucose-Capillary: 128 mg/dL — ABNORMAL HIGH (ref 70–99)
Glucose-Capillary: 134 mg/dL — ABNORMAL HIGH (ref 70–99)
Glucose-Capillary: 149 mg/dL — ABNORMAL HIGH (ref 70–99)
Glucose-Capillary: 163 mg/dL — ABNORMAL HIGH (ref 70–99)
Glucose-Capillary: 165 mg/dL — ABNORMAL HIGH (ref 70–99)
Glucose-Capillary: 165 mg/dL — ABNORMAL HIGH (ref 70–99)
Glucose-Capillary: 166 mg/dL — ABNORMAL HIGH (ref 70–99)

## 2022-01-24 LAB — CBC
HCT: 31 % — ABNORMAL LOW (ref 39.0–52.0)
Hemoglobin: 10.5 g/dL — ABNORMAL LOW (ref 13.0–17.0)
MCH: 31.3 pg (ref 26.0–34.0)
MCHC: 33.9 g/dL (ref 30.0–36.0)
MCV: 92.3 fL (ref 80.0–100.0)
Platelets: 139 10*3/uL — ABNORMAL LOW (ref 150–400)
RBC: 3.36 MIL/uL — ABNORMAL LOW (ref 4.22–5.81)
RDW: 13.2 % (ref 11.5–15.5)
WBC: 11.1 10*3/uL — ABNORMAL HIGH (ref 4.0–10.5)
nRBC: 0 % (ref 0.0–0.2)

## 2022-01-24 MED ORDER — FUROSEMIDE 10 MG/ML IJ SOLN
40.0000 mg | Freq: Once | INTRAMUSCULAR | Status: AC
  Administered 2022-01-24: 40 mg via INTRAVENOUS
  Filled 2022-01-24: qty 4

## 2022-01-24 MED ORDER — INSULIN ASPART 100 UNIT/ML IJ SOLN
0.0000 [IU] | Freq: Three times a day (TID) | INTRAMUSCULAR | Status: DC
  Administered 2022-01-24 (×2): 3 [IU] via SUBCUTANEOUS
  Administered 2022-01-24 – 2022-01-25 (×2): 2 [IU] via SUBCUTANEOUS
  Administered 2022-01-25 – 2022-01-26 (×3): 3 [IU] via SUBCUTANEOUS
  Administered 2022-01-26 – 2022-01-27 (×4): 2 [IU] via SUBCUTANEOUS
  Administered 2022-01-27 – 2022-01-28 (×2): 3 [IU] via SUBCUTANEOUS
  Administered 2022-01-28: 2 [IU] via SUBCUTANEOUS

## 2022-01-24 MED ORDER — CHLORHEXIDINE GLUCONATE CLOTH 2 % EX PADS
6.0000 | MEDICATED_PAD | Freq: Every day | CUTANEOUS | Status: DC
  Administered 2022-01-24 – 2022-01-28 (×5): 6 via TOPICAL

## 2022-01-24 NOTE — Progress Notes (Signed)
NAME:  Malik [name], MRN:  [id number], DOB:  [birth year], LOS: 1 ADMISSION DATE:  01/23/2022, CONSULTATION DATE: 01/23/2022 REFERRING MD: Dr. Lavonna Monarch, CHIEF COMPLAINT: S/p CABG and AVR  History of Present Illness:  71 year old male with coronary artery disease, severe aortic stenosis, hypertension and hyperlipidemia who presented with increasing exertional chest pain and shortness of breath which has been getting worse for the past few months.  Patient denies dizziness, headache, vision problem or any other complaint.  On work-up patient was noted to have severe aortic stenosis and RCA stenosis.  Today patient underwent aortic valve replacement and CABG x1.  PCCM was consulted for help evaluation and medical management  Pertinent  Medical History   Past Medical History:  Diagnosis Date   Aortic stenosis    Arthritis    ASHD (arteriosclerotic heart disease)    Chest pressure    COVID-19 virus infection 11/2019   x 3   GERD (gastroesophageal reflux disease)    Heart murmur    History of kidney stones    Hyperglycemia    Hyperlipidemia    Hypertension    Nephrolithiasis    Obesity      Significant Hospital Events: Including procedures, antibiotic start and stop dates in addition to other pertinent events     Interim History / Subjective:  Patient was successfully extubated per rapid weaning protocol Remain on low-dose phenylephrine Complaining of surgical incision pain  Objective   Blood pressure 97/66, pulse 80, temperature 99 F (37.2 C), resp. rate (!) 24, weight (!) 140.7 kg, SpO2 95 %. PAP: (19-43)/(10-29) 27/10 CVP:  [0 mmHg-19 mmHg] 8 mmHg CO:  [3.8 L/min-7 L/min] 7 L/min CI:  [1.5 L/min/m2-2.8 L/min/m2] 2.8 L/min/m2  Vent Mode: SIMV;PRVC;PSV FiO2 (%):  [40 %-50 %] 40 % Set Rate:  [4 bmp-12 bmp] 4 bmp Vt Set:  [620 mL] 620 mL PEEP:  [5 cmH20] 5 cmH20 Pressure Support:  [10 cmH20] 10 cmH20 Plateau Pressure:  [14 cmH20-18 cmH20] 18 cmH20   Intake/Output  Summary (Last 24 hours) at 01/24/2022 0803 Last data filed at 01/24/2022 0700 Gross per 24 hour  Intake 5581.24 ml  Output 3854 ml  Net 1727.24 ml   Filed Weights   01/24/22 0500  Weight: (!) 140.7 kg    Examination: Physical exam: General: Elderly morbidly obese male, lying on the bed HEENT: [place]/AT, eyes anicteric.  moist mucus membranes Neuro: Alert, awake following commands Chest: Coarse breath sounds, no wheezes or rhonchi.  Chest tube in place Heart: Paced rhythm, no murmurs or gallops Abdomen: Soft, nontender, nondistended, bowel sounds present Skin: No rash   Resolved Hospital Problem list     Assessment & Plan:  Coronary artery disease s/p CABG Severe aortic stenosis s/p AVR Acute respiratory insufficiency, postop Postop atelectasis Hypertension Hyperlipidemia Morbid obesity Expected acute blood loss anemia/thrombocytopenia, postprocedure   Continue aspirin and statin Chest tube management TCTS Off Precedex Monitor H&H Respiratory extubated yesterday Encourage incentive spirometry Gentle diuresis today Advance diet as tolerated Continue pain control with tramadol, oxycodone and fentanyl Require low-dose phenylephrine, titrate with SBP goal 100 Discontinue arterial line Insulin infusion was stopped, switched to sliding scale Monitor urine output and labs Diet and exercise counseling provided. Monitor H&H and platelet count   Best Practice (right click and "Reselect all SmartList Selections" daily)   Diet/type: Advance as tolerated DVT prophylaxis: SCD GI prophylaxis: PPI Lines: Central line and yes and it is still needed Foley:  Yes, and it is still needed Code Status:  full  code Last date of multidisciplinary goals of care discussion [Per primary team]  Labs   CBC: Recent Labs  Lab 01/19/22 1200 01/23/22 0817 01/23/22 1025 01/23/22 1115 01/23/22 1229 01/23/22 1236 01/23/22 1552 01/23/22 1724 01/23/22 1725 01/24/22 0501  WBC 6.9  --    --   --  16.1*  --   --  15.6*  --  11.1*  HGB 15.3   < > 12.3*   < > 12.8* 11.9* 11.9* 12.4* 11.6* 10.5*  HCT 44.7   < > 35.4*   < > 37.4* 35.0* 35.0* 35.2* 34.0* 31.0*  MCV 91.4  --   --   --  91.9  --   --  90.5  --  92.3  PLT 163  --  150  --  131*  --   --  157  --  139*   < > = values in this interval not displayed.    Basic Metabolic Panel: Recent Labs  Lab 01/19/22 1200 01/23/22 0817 01/23/22 0906 01/23/22 0935 01/23/22 1002 01/23/22 1115 01/23/22 1236 01/23/22 1552 01/23/22 1724 01/23/22 1725 01/24/22 0501  NA 139   < > 139   < > 138 138 140 141 142 141 137  K 3.8   < > 3.8   < > 4.3 3.9 3.6 4.5 4.7 4.6 4.0  CL 108   < > 102  --  102 102  --   --  108  --  105  CO2 23  --   --   --   --   --   --   --  23  --  23  GLUCOSE 93   < > 128*  --  155* 197*  --   --  145*  --  128*  BUN 12   < > 9  --  9 10  --   --  10  --  13  CREATININE 0.78   < > 0.70  --  0.60* 0.70  --   --  0.86  --  0.87  CALCIUM 9.3  --   --   --   --   --   --   --  8.6*  --  8.4*  MG  --   --   --   --   --   --   --   --  2.8*  --  2.4   < > = values in this interval not displayed.   GFR: Estimated Creatinine Clearance: 111.6 mL/min (by C-G formula based on SCr of 0.87 mg/dL). Recent Labs  Lab 01/19/22 1200 01/23/22 1229 01/23/22 1724 01/24/22 0501  WBC 6.9 16.1* 15.6* 11.1*    Liver Function Tests: Recent Labs  Lab 01/19/22 1200  AST 18  ALT 24  ALKPHOS 53  BILITOT 0.9  PROT 6.7  ALBUMIN 3.8   No results for input(s): "LIPASE", "AMYLASE" in the last 168 hours. No results for input(s): "AMMONIA" in the last 168 hours.  ABG    Component Value Date/Time   PHART 7.321 (L) 01/23/2022 1725   PCO2ART 42.1 01/23/2022 1725   PO2ART 82 (L) 01/23/2022 1725   HCO3 21.6 01/23/2022 1725   TCO2 23 01/23/2022 1725   ACIDBASEDEF 4.0 (H) 01/23/2022 1725   O2SAT 95 01/23/2022 1725     Coagulation Profile: Recent Labs  Lab 01/19/22 1200 01/23/22 1229  INR 1.1 1.3*    Cardiac  Enzymes: No results for input(s): "CKTOTAL", "CKMB", "CKMBINDEX", "TROPONINI" in the  last 168 hours.  HbA1C: Hgb A1c MFr Bld  Date/Time Value Ref Range Status  01/19/2022 11:42 AM 5.9 (H) 4.8 - 5.6 % Final    Comment:    (NOTE) Pre diabetes:          5.7%-6.4%  Diabetes:              >6.4%  Glycemic control for   <7.0% adults with diabetes   04/04/2017 09:41 AM 6.4 (H) 4.8 - 5.6 % Final    Comment:             Prediabetes: 5.7 - 6.4          Diabetes: >6.4          Glycemic control for adults with diabetes: <7.0     CBG: Recent Labs  Lab 01/24/22 0020 01/24/22 0212 01/24/22 0309 01/24/22 0506 01/24/22 0706  GLUCAP 165* 149* 126* 128* 134*   Critical care time:      Total critical care time: 32 minutes  Performed by: [place] care time was exclusive of separately billable procedures and treating other patients.   Critical care was necessary to treat or prevent imminent or life-threatening deterioration.   Critical care was time spent personally by me on the following activities: development of treatment plan with patient and/or surrogate as well as nursing, discussions with consultants, evaluation of patient's response to treatment, examination of patient, obtaining history from patient or surrogate, ordering and performing treatments and interventions, ordering and review of laboratory studies, ordering and review of radiographic studies, pulse oximetry and re-evaluation of patient's condition.   Jacky Kindle, MD [facility] See Amion for pager If no response to pager, please call [phone] until 7pm After 7pm, Please call E-link [phone]

## 2022-01-24 NOTE — Care Management (Signed)
  Transition of Care [facility]) Screening Note   Patient Details  Name: Malik [name] Date of Birth: May 23, 1950   Transition of Care [facility]) CM/SW Contact:    Bethena Roys, RN Phone Number: 01/24/2022, 12:36 PM    Transition of Care Department [facility] has reviewed the patient and no TOC needs have been identified at this time. Patient is POD-1 AVR. Case Manager will continue to monitor patient advancement through interdisciplinary progression rounds. If new patient transition needs arise, please place a TOC consult.

## 2022-01-24 NOTE — Progress Notes (Signed)
POD 0 AVR  Doing well  Extubated   Neo at 35 VP at 80, junctional at 50s underneath.      Latest Ref Rng & Units 01/23/2022    5:25 PM 01/23/2022    5:24 PM 01/23/2022    3:52 PM  CBC  WBC 4.0 - 10.5 K/uL  15.6    Hemoglobin 13.0 - 17.0 g/dL 11.6  12.4  11.9   Hematocrit 39.0 - 52.0 % 34.0  35.2  35.0   Platelets 150 - 400 K/uL  157        Latest Ref Rng & Units 01/23/2022    5:25 PM 01/23/2022    5:24 PM 01/23/2022    3:52 PM  CMP  Glucose 70 - 99 mg/dL  145    BUN 8 - 23 mg/dL  10    Creatinine 0.61 - 1.24 mg/dL  0.86    Sodium 135 - 145 mmol/L 141  142  141   Potassium 3.5 - 5.1 mmol/L 4.6  4.7  4.5   Chloride 98 - 111 mmol/L  108    CO2 22 - 32 mmol/L  23    Calcium 8.9 - 10.3 mg/dL  8.6     I/O last 3 completed shifts: In: 4735.2 [I.V.:2691.4; Blood:545; IV Piggyback:1498.8] Out: 3189 [OEUMP:5361; Blood:779; Chest Tube:780]

## 2022-01-24 NOTE — Progress Notes (Signed)
[address]       [place] [address]             [phone]      1 Day Post-Op  Procedure(s) (LRB): AORTIC VALVE REPLACEMENT (AVR) USING 27MM INSPIRIS RESILIA  AORTIC VALVE (N/A) CORONARY ARTERY BYPASS GRAFTING (CABGX1) USING ENDOSCOPICALLY HARVESTED RIGHT GREATER SAPHENOUS VEIN (N/A) TRANSESOPHAGEAL ECHOCARDIOGRAM (TEE) (N/A)   Total Length of Stay:  LOS: 1 day    SUBJECTIVE: No issues over night Feels like he was run over by a truck Was out of bed earlier Vitals:   01/24/22 0645 01/24/22 0700  BP:    Pulse: 80 80  Resp: (!) 28 (!) 24  Temp: 99 F (37.2 C) 99 F (37.2 C)  SpO2: 95% 95%    Intake/Output      10/31 0701 11/01 0700 11/01 0701 11/02 0700   I.V. (mL/kg) 3137.4 (22.3)    Blood 545    IV Piggyback 1898.9    Total Intake(mL/kg) 5581.2 (39.7)    Urine (mL/kg/hr) 2165 (0.6)    Blood 779    Chest Tube 910    Total Output 3854    Net +1727.2             sodium chloride     sodium chloride     sodium chloride 10 mL/hr at 01/24/22 0700   albumin human 60 mL/hr at 01/23/22 2000    ceFAZolin (ANCEF) IV Stopped (01/24/22 0549)   famotidine (PEPCID) IV Stopped (01/23/22 1328)   insulin Stopped (01/23/22 1701)   lactated ringers     lactated ringers     lactated ringers 20 mL/hr at 01/23/22 1215   nitroGLYCERIN     phenylephrine (NEO-SYNEPHRINE) Adult infusion 20 mcg/min (01/24/22 0700)    CBC    Component Value Date/Time   WBC 11.1 (H) 01/24/2022 0501   RBC 3.36 (L) 01/24/2022 0501   HGB 10.5 (L) 01/24/2022 0501   HGB 14.9 11/22/2021 0909   HCT 31.0 (L) 01/24/2022 0501   HCT 43.4 11/22/2021 0909   PLT 139 (L) 01/24/2022 0501   PLT 164 11/22/2021 0909   MCV 92.3 01/24/2022 0501   MCV 91 11/22/2021 0909   MCH 31.3 01/24/2022 0501   MCHC 33.9 01/24/2022 0501   RDW 13.2 01/24/2022 0501   RDW 14.5 11/22/2021 0909   LYMPHSABS 1.4 11/22/2021 0909   MONOABS 0.4 05/13/2014 1031   EOSABS 0.2 11/22/2021 0909   BASOSABS  0.0 11/22/2021 0909   CMP     Component Value Date/Time   NA 137 01/24/2022 0501   NA 139 11/22/2021 0909   K 4.0 01/24/2022 0501   CL 105 01/24/2022 0501   CO2 23 01/24/2022 0501   GLUCOSE 128 (H) 01/24/2022 0501   BUN 13 01/24/2022 0501   BUN 10 11/22/2021 0909   CREATININE 0.87 01/24/2022 0501   CREATININE 0.94 12/15/2015 0914   CALCIUM 8.4 (L) 01/24/2022 0501   PROT 6.7 01/19/2022 1200   PROT 6.9 08/12/2017 1233   ALBUMIN 3.8 01/19/2022 1200   ALBUMIN 4.3 08/12/2017 1233   AST 18 01/19/2022 1200   ALT 24 01/19/2022 1200   ALKPHOS 53 01/19/2022 1200   BILITOT 0.9 01/19/2022 1200   BILITOT 0.6 08/12/2017 1233   GFRNONAA >60 01/24/2022 0501   GFRNONAA 85 12/15/2015 0914   GFRAA >60 11/02/2019 1030   GFRAA >89 12/15/2015 0914   ABG    Component Value Date/Time   PHART 7.321 (L)  01/23/2022 1725   PCO2ART 42.1 01/23/2022 1725   PO2ART 82 (L) 01/23/2022 1725   HCO3 21.6 01/23/2022 1725   TCO2 23 01/23/2022 1725   ACIDBASEDEF 4.0 (H) 01/23/2022 1725   O2SAT 95 01/23/2022 1725   CBG (last 3)  Recent Labs    01/24/22 0506 01/24/22 0706 01/24/22 0825  GLUCAP 128* 134* 127*   EXAM Lungs: decreased on Left base Card: RR soft murmur Ext: warm and dry   ASSESSMENT: POD #1 SP AVR/Cab x ! Doing well Will wean neo for SBP over 100 then DC art line once off VVI at 50bpm DC swan Lasix Leave in 2 H today    Eugenio Hoes, MD @DATE @

## 2022-01-24 NOTE — Progress Notes (Signed)
      [address]       [place] [address]             [phone]      POD # 1 AVR  Up in chair BP 112/70   Pulse (!) 103   Temp 97.9 F (36.6 C) (Oral)   Resp (!) 21   Wt (!) 140.7 kg   SpO2 94%   BMI 42.07 kg/m  4L [place] 92% sat Off neo  Intake/Output Summary (Last 24 hours) at 01/24/2022 1756 Last data filed at 01/24/2022 1700 Gross per 24 hour  Intake 1471.25 ml  Output 1900 ml  Net -428.75 ml   CBG mildly elevated  Continue current care  Jaquanna Ballentine C. Roxan Hockey, MD [facility] [phone]

## 2022-01-25 ENCOUNTER — Inpatient Hospital Stay (HOSPITAL_COMMUNITY): Payer: Medicare Other

## 2022-01-25 DIAGNOSIS — Z952 Presence of prosthetic heart valve: Secondary | ICD-10-CM | POA: Diagnosis not present

## 2022-01-25 DIAGNOSIS — I2510 Atherosclerotic heart disease of native coronary artery without angina pectoris: Secondary | ICD-10-CM | POA: Diagnosis not present

## 2022-01-25 DIAGNOSIS — I350 Nonrheumatic aortic (valve) stenosis: Secondary | ICD-10-CM | POA: Diagnosis not present

## 2022-01-25 LAB — BASIC METABOLIC PANEL WITH GFR
Anion gap: 10 (ref 5–15)
BUN: 22 mg/dL (ref 8–23)
CO2: 26 mmol/L (ref 22–32)
Calcium: 8.7 mg/dL — ABNORMAL LOW (ref 8.9–10.3)
Chloride: 100 mmol/L (ref 98–111)
Creatinine, Ser: 1.05 mg/dL (ref 0.61–1.24)
GFR, Estimated: >60 mL/min
Glucose, Bld: 168 mg/dL — ABNORMAL HIGH (ref 70–99)
Potassium: 4.2 mmol/L (ref 3.5–5.1)
Sodium: 136 mmol/L (ref 135–145)

## 2022-01-25 LAB — CBC
HCT: 29.1 % — ABNORMAL LOW (ref 39.0–52.0)
Hemoglobin: 10.2 g/dL — ABNORMAL LOW (ref 13.0–17.0)
MCH: 32.2 pg (ref 26.0–34.0)
MCHC: 35.1 g/dL (ref 30.0–36.0)
MCV: 91.8 fL (ref 80.0–100.0)
Platelets: 107 K/uL — ABNORMAL LOW (ref 150–400)
RBC: 3.17 MIL/uL — ABNORMAL LOW (ref 4.22–5.81)
RDW: 13.2 % (ref 11.5–15.5)
WBC: 12.6 K/uL — ABNORMAL HIGH (ref 4.0–10.5)
nRBC: 0 % (ref 0.0–0.2)

## 2022-01-25 LAB — MAGNESIUM: Magnesium: 2.2 mg/dL (ref 1.7–2.4)

## 2022-01-25 LAB — GLUCOSE, CAPILLARY
Glucose-Capillary: 193 mg/dL — ABNORMAL HIGH (ref 70–99)
Glucose-Capillary: 196 mg/dL — ABNORMAL HIGH (ref 70–99)
Glucose-Capillary: 149 mg/dL — ABNORMAL HIGH (ref 70–99)
Glucose-Capillary: 141 mg/dL — ABNORMAL HIGH (ref 70–99)

## 2022-01-25 MED ORDER — FUROSEMIDE 10 MG/ML IJ SOLN
40.0000 mg | Freq: Two times a day (BID) | INTRAMUSCULAR | Status: DC
  Administered 2022-01-25 – 2022-01-27 (×5): 40 mg via INTRAVENOUS
  Filled 2022-01-25 (×5): qty 4

## 2022-01-25 MED ORDER — ENOXAPARIN SODIUM 40 MG/0.4ML IJ SOSY
40.0000 mg | PREFILLED_SYRINGE | Freq: Two times a day (BID) | INTRAMUSCULAR | Status: DC
  Administered 2022-01-25 – 2022-01-28 (×7): 40 mg via SUBCUTANEOUS
  Filled 2022-01-25 (×7): qty 0.4

## 2022-01-25 NOTE — Progress Notes (Signed)
TCTS Progress Note:  Seen at bedside  Doing well  No acute issues     Latest Ref Rng & Units 01/25/2022    5:28 AM 01/24/2022    5:01 AM 01/23/2022    5:25 PM  CBC  WBC 4.0 - 10.5 K/uL 12.6  11.1    Hemoglobin 13.0 - 17.0 g/dL 10.2  10.5  11.6   Hematocrit 39.0 - 52.0 % 29.1  31.0  34.0   Platelets 150 - 400 K/uL 107  139         Latest Ref Rng & Units 01/25/2022    5:28 AM 01/24/2022    5:01 AM 01/23/2022    5:25 PM  CMP  Glucose 70 - 99 mg/dL 168  128    BUN 8 - 23 mg/dL 22  13    Creatinine 0.61 - 1.24 mg/dL 1.05  0.87    Sodium 135 - 145 mmol/L 136  137  141   Potassium 3.5 - 5.1 mmol/L 4.2  4.0  4.6   Chloride 98 - 111 mmol/L 100  105    CO2 22 - 32 mmol/L 26  23    Calcium 8.9 - 10.3 mg/dL 8.7  8.4      ABG    Component Value Date/Time   PHART 7.321 (L) 01/23/2022 1725   PCO2ART 42.1 01/23/2022 1725   PO2ART 82 (L) 01/23/2022 1725   HCO3 21.6 01/23/2022 1725   TCO2 23 01/23/2022 1725   ACIDBASEDEF 4.0 (H) 01/23/2022 1725   O2SAT 95 01/23/2022 1725

## 2022-01-25 NOTE — Progress Notes (Signed)
[address]       [place] [address]             [phone]      2 Days Post-Op  Procedure(s) (LRB): AORTIC VALVE REPLACEMENT (AVR) USING 27MM INSPIRIS RESILIA  AORTIC VALVE (N/A) CORONARY ARTERY BYPASS GRAFTING (CABGX1) USING ENDOSCOPICALLY HARVESTED RIGHT GREATER SAPHENOUS VEIN (N/A) TRANSESOPHAGEAL ECHOCARDIOGRAM (TEE) (N/A)   Total Length of Stay:  LOS: 2 days    SUBJECTIVE: Didn't sleep last night Feeling overall better Walked yesterday Vitals:   01/25/22 0600 01/25/22 0700  BP: 127/72 113/70  Pulse: 92 (!) 102  Resp: 16 19  Temp:  98.3 F (36.8 C)  SpO2: 96% 93%    Intake/Output      11/01 0701 11/02 0700 11/02 0701 11/03 0700   P.O. 240    I.V. (mL/kg) 203.9 (1.5)    Blood     IV Piggyback 201.8    Total Intake(mL/kg) 645.7 (4.6)    Urine (mL/kg/hr) 1325 (0.4)    Blood     Chest Tube 210    Total Output 1535    Net -889.3             sodium chloride     sodium chloride     sodium chloride 10 mL/hr at 01/24/22 2200   lactated ringers     lactated ringers     lactated ringers 20 mL/hr at 01/23/22 1215    CBC    Component Value Date/Time   WBC 12.6 (H) 01/25/2022 0528   RBC 3.17 (L) 01/25/2022 0528   HGB 10.2 (L) 01/25/2022 0528   HGB 14.9 11/22/2021 0909   HCT 29.1 (L) 01/25/2022 0528   HCT 43.4 11/22/2021 0909   PLT 107 (L) 01/25/2022 0528   PLT 164 11/22/2021 0909   MCV 91.8 01/25/2022 0528   MCV 91 11/22/2021 0909   MCH 32.2 01/25/2022 0528   MCHC 35.1 01/25/2022 0528   RDW 13.2 01/25/2022 0528   RDW 14.5 11/22/2021 0909   LYMPHSABS 1.4 11/22/2021 0909   MONOABS 0.4 05/13/2014 1031   EOSABS 0.2 11/22/2021 0909   BASOSABS 0.0 11/22/2021 0909   CMP     Component Value Date/Time   NA 136 01/25/2022 0528   NA 139 11/22/2021 0909   K 4.2 01/25/2022 0528   CL 100 01/25/2022 0528   CO2 26 01/25/2022 0528   GLUCOSE 168 (H) 01/25/2022 0528   BUN 22 01/25/2022 0528   BUN 10 11/22/2021 0909   CREATININE  1.05 01/25/2022 0528   CREATININE 0.94 12/15/2015 0914   CALCIUM 8.7 (L) 01/25/2022 0528   PROT 6.7 01/19/2022 1200   PROT 6.9 08/12/2017 1233   ALBUMIN 3.8 01/19/2022 1200   ALBUMIN 4.3 08/12/2017 1233   AST 18 01/19/2022 1200   ALT 24 01/19/2022 1200   ALKPHOS 53 01/19/2022 1200   BILITOT 0.9 01/19/2022 1200   BILITOT 0.6 08/12/2017 1233   GFRNONAA >60 01/25/2022 0528   GFRNONAA 85 12/15/2015 0914   GFRAA >60 11/02/2019 1030   GFRAA >89 12/15/2015 0914   ABG    Component Value Date/Time   PHART 7.321 (L) 01/23/2022 1725   PCO2ART 42.1 01/23/2022 1725   PO2ART 82 (L) 01/23/2022 1725   HCO3 21.6 01/23/2022 1725   TCO2 23 01/23/2022 1725   ACIDBASEDEF 4.0 (H) 01/23/2022 1725   O2SAT 95 01/23/2022 1725   CBG (last 3)  Recent Labs    01/24/22 1528 01/24/22 2208  01/25/22 0647  GLUCAP 165* 166* 193*   EXAM Lungs: decreased in the bases Cark: rr with soft murmur Ext: warm. No edema  ASSESSMENT: POD #2 sp AVR/CABG Doing well DC Pws, CT, foley Diuresis today Needs to ambulate more Leave in unit today DVT prophylaxis Advance diet   Coralie Common, MD @DATE @

## 2022-01-25 NOTE — Progress Notes (Signed)
Pacing wires discontinued as per orders.  Upon pulling wires, atrial wires pulled out fine however ventricular wires would not come thru the skin as we were pulling them out.  Quincy Simmonds PA-C and Dr. Lavonna Monarch notified. Danielle came and was able to pull the ventricular wires out after cutting a small slit in the skin and then pulled the pacing wires out afterwards.Patient tolerated procedure well.

## 2022-01-25 NOTE — Progress Notes (Signed)
NAME:  Malik [name], MRN:  [id number], DOB:  Sep 26, 1950, LOS: 2 ADMISSION DATE:  01/23/2022, CONSULTATION DATE: 01/23/2022 REFERRING MD: Dr. Lavonna Monarch, CHIEF COMPLAINT: S/p CABG and AVR  History of Present Illness:  71 year old male with coronary artery disease, severe aortic stenosis, hypertension and hyperlipidemia who presented with increasing exertional chest pain and shortness of breath which has been getting worse for the past few months.  Patient denies dizziness, headache, vision problem or any other complaint.  On work-up patient was noted to have severe aortic stenosis and RCA stenosis.  Today patient underwent aortic valve replacement and CABG x1.  PCCM was consulted for help evaluation and medical management  Pertinent  Medical History   Past Medical History:  Diagnosis Date   Aortic stenosis    Arthritis    ASHD (arteriosclerotic heart disease)    Chest pressure    COVID-19 virus infection 11/2019   x 3   GERD (gastroesophageal reflux disease)    Heart murmur    History of kidney stones    Hyperglycemia    Hyperlipidemia    Hypertension    Nephrolithiasis    Obesity      Significant Hospital Events: Including procedures, antibiotic start and stop dates in addition to other pertinent events     Interim History / Subjective:  Stated could not sleep last night Denies chest pain, shortness of breath Stated overall feeling well  Objective   Blood pressure 113/70, pulse 84, temperature 98.5 F (36.9 C), temperature source Oral, resp. rate 19, weight (!) 140.2 kg, SpO2 95 %. PAP: (20-26)/(9-17) 26/16 CVP:  [5 mmHg-6 mmHg] 5 mmHg      Intake/Output Summary (Last 24 hours) at 01/25/2022 0948 Last data filed at 01/25/2022 0600 Gross per 24 hour  Intake 576.03 ml  Output 1440 ml  Net -863.97 ml   Filed Weights   01/24/22 0500 01/25/22 0500  Weight: (!) 140.7 kg (!) 140.2 kg    Examination: Physical exam: General: Elderly, morbidly obese male, lying on the  bed HEENT: [place]/AT, eyes anicteric.  moist mucus membranes Neuro: Alert, awake following commands Chest: Coarse breath sounds, no wheezes or rhonchi.  Chest tube in place Heart: Paced rhythm, no murmurs or gallops Abdomen: Soft, nontender, nondistended, bowel sounds present Skin: No rash   Resolved Hospital Problem list     Assessment & Plan:  Coronary artery disease s/p CABG Severe aortic stenosis s/p AVR Acute respiratory insufficiency, postop Postop atelectasis Hypertension Hyperlipidemia Morbid obesity Expected acute blood loss anemia/thrombocytopenia, postprocedure   Continue aspirin and statin Chest tube will be removed per TCTS Monitor H&H Encourage incentive spirometry Gentle diuresis today Advance diet as tolerated Continue pain control with tramadol, oxycodone and fentanyl Off vasopressor support Monitor urine output and labs Diet and exercise counseling provided. Monitor H&H and platelet count   Best Practice (right click and "Reselect all SmartList Selections" daily)   Diet/type: Advance as tolerated DVT prophylaxis: SCD GI prophylaxis: PPI Lines: Central line and yes and it is still needed Foley: Discontinued today-. Code Status:  full code Last date of multidisciplinary goals of care discussion [Per primary team]  Labs   CBC: Recent Labs  Lab 01/19/22 1200 01/23/22 0817 01/23/22 1025 01/23/22 1115 01/23/22 1229 01/23/22 1236 01/23/22 1552 01/23/22 1724 01/23/22 1725 01/24/22 0501 01/25/22 0528  WBC 6.9  --   --   --  16.1*  --   --  15.6*  --  11.1* 12.6*  HGB 15.3   < > 12.3*   < >  12.8*   < > 11.9* 12.4* 11.6* 10.5* 10.2*  HCT 44.7   < > 35.4*   < > 37.4*   < > 35.0* 35.2* 34.0* 31.0* 29.1*  MCV 91.4  --   --   --  91.9  --   --  90.5  --  92.3 91.8  PLT 163  --  150  --  131*  --   --  157  --  139* 107*   < > = values in this interval not displayed.    Basic Metabolic Panel: Recent Labs  Lab 01/19/22 1200 01/23/22 0817  01/23/22 1002 01/23/22 1115 01/23/22 1236 01/23/22 1552 01/23/22 1724 01/23/22 1725 01/24/22 0501 01/25/22 0528  NA 139   < > 138 138   < > 141 142 141 137 136  K 3.8   < > 4.3 3.9   < > 4.5 4.7 4.6 4.0 4.2  CL 108   < > 102 102  --   --  108  --  105 100  CO2 23  --   --   --   --   --  23  --  23 26  GLUCOSE 93   < > 155* 197*  --   --  145*  --  128* 168*  BUN 12   < > 9 10  --   --  10  --  13 22  CREATININE 0.78   < > 0.60* 0.70  --   --  0.86  --  0.87 1.05  CALCIUM 9.3  --   --   --   --   --  8.6*  --  8.4* 8.7*  MG  --   --   --   --   --   --  2.8*  --  2.4 2.2   < > = values in this interval not displayed.   GFR: Estimated Creatinine Clearance: 92.3 mL/min (by C-G formula based on SCr of 1.05 mg/dL). Recent Labs  Lab 01/23/22 1229 01/23/22 1724 01/24/22 0501 01/25/22 0528  WBC 16.1* 15.6* 11.1* 12.6*    Liver Function Tests: Recent Labs  Lab 01/19/22 1200  AST 18  ALT 24  ALKPHOS 53  BILITOT 0.9  PROT 6.7  ALBUMIN 3.8   No results for input(s): "LIPASE", "AMYLASE" in the last 168 hours. No results for input(s): "AMMONIA" in the last 168 hours.  ABG    Component Value Date/Time   PHART 7.321 (L) 01/23/2022 1725   PCO2ART 42.1 01/23/2022 1725   PO2ART 82 (L) 01/23/2022 1725   HCO3 21.6 01/23/2022 1725   TCO2 23 01/23/2022 1725   ACIDBASEDEF 4.0 (H) 01/23/2022 1725   O2SAT 95 01/23/2022 1725     Coagulation Profile: Recent Labs  Lab 01/19/22 1200 01/23/22 1229  INR 1.1 1.3*    Cardiac Enzymes: No results for input(s): "CKTOTAL", "CKMB", "CKMBINDEX", "TROPONINI" in the last 168 hours.  HbA1C: Hgb A1c MFr Bld  Date/Time Value Ref Range Status  01/19/2022 11:42 AM 5.9 (H) 4.8 - 5.6 % Final    Comment:    (NOTE) Pre diabetes:          5.7%-6.4%  Diabetes:              >6.4%  Glycemic control for   <7.0% adults with diabetes   04/04/2017 09:41 AM 6.4 (H) 4.8 - 5.6 % Final    Comment:  Prediabetes: 5.7 - 6.4           Diabetes: >6.4          Glycemic control for adults with diabetes: <7.0     CBG: Recent Labs  Lab 01/24/22 0825 01/24/22 1101 01/24/22 1528 01/24/22 2208 01/25/22 0647  GLUCAP 127* 163* 165* 166* 193*      Jacky Kindle, MD [facility] See Amion for pager If no response to pager, please call [phone] until 7pm After 7pm, Please call E-link [phone]

## 2022-01-26 ENCOUNTER — Inpatient Hospital Stay (HOSPITAL_COMMUNITY): Payer: Medicare Other

## 2022-01-26 DIAGNOSIS — I4729 Other ventricular tachycardia: Secondary | ICD-10-CM | POA: Diagnosis not present

## 2022-01-26 DIAGNOSIS — Z952 Presence of prosthetic heart valve: Secondary | ICD-10-CM | POA: Diagnosis not present

## 2022-01-26 DIAGNOSIS — R739 Hyperglycemia, unspecified: Secondary | ICD-10-CM | POA: Diagnosis not present

## 2022-01-26 LAB — GLUCOSE, CAPILLARY
Glucose-Capillary: 150 mg/dL — ABNORMAL HIGH (ref 70–99)
Glucose-Capillary: 168 mg/dL — ABNORMAL HIGH (ref 70–99)
Glucose-Capillary: 138 mg/dL — ABNORMAL HIGH (ref 70–99)
Glucose-Capillary: 177 mg/dL — ABNORMAL HIGH (ref 70–99)

## 2022-01-26 LAB — CBC
HCT: 26.1 % — ABNORMAL LOW (ref 39.0–52.0)
Hemoglobin: 9.1 g/dL — ABNORMAL LOW (ref 13.0–17.0)
MCH: 31.8 pg (ref 26.0–34.0)
MCHC: 34.9 g/dL (ref 30.0–36.0)
MCV: 91.3 fL (ref 80.0–100.0)
Platelets: 102 10*3/uL — ABNORMAL LOW (ref 150–400)
RBC: 2.86 MIL/uL — ABNORMAL LOW (ref 4.22–5.81)
RDW: 13.2 % (ref 11.5–15.5)
WBC: 10.3 10*3/uL (ref 4.0–10.5)
nRBC: 0 % (ref 0.0–0.2)

## 2022-01-26 LAB — BASIC METABOLIC PANEL
Anion gap: 9 (ref 5–15)
BUN: 21 mg/dL (ref 8–23)
CO2: 26 mmol/L (ref 22–32)
Calcium: 8.4 mg/dL — ABNORMAL LOW (ref 8.9–10.3)
Chloride: 100 mmol/L (ref 98–111)
Creatinine, Ser: 0.83 mg/dL (ref 0.61–1.24)
GFR, Estimated: >60 mL/min (ref >60)
Glucose, Bld: 144 mg/dL — ABNORMAL HIGH (ref 70–99)
Potassium: 3.8 mmol/L (ref 3.5–5.1)
Sodium: 135 mmol/L (ref 135–145)

## 2022-01-26 MED ORDER — ORAL CARE MOUTH RINSE: 15.0000 mL | OROMUCOSAL | Status: DC | PRN

## 2022-01-26 MED ORDER — POTASSIUM CHLORIDE CRYS ER 20 MEQ PO TBCR
20.0000 meq | EXTENDED_RELEASE_TABLET | Freq: Two times a day (BID) | ORAL | Status: DC
  Administered 2022-01-26 – 2022-01-28 (×5): 20 meq via ORAL
  Filled 2022-01-26 (×5): qty 1

## 2022-01-26 MED ORDER — AMIODARONE HCL 200 MG PO TABS
200.0000 mg | ORAL_TABLET | Freq: Two times a day (BID) | ORAL | Status: DC
  Administered 2022-01-26 – 2022-01-28 (×5): 200 mg via ORAL
  Filled 2022-01-26 (×5): qty 1

## 2022-01-26 MED ORDER — METOPROLOL TARTRATE 50 MG PO TABS
50.0000 mg | ORAL_TABLET | Freq: Two times a day (BID) | ORAL | Status: DC
  Administered 2022-01-26 – 2022-01-28 (×5): 50 mg via ORAL
  Filled 2022-01-26 (×5): qty 1

## 2022-01-26 MED ORDER — MELATONIN 3 MG PO TABS
3.0000 mg | ORAL_TABLET | Freq: Every evening | ORAL | Status: DC | PRN
  Administered 2022-01-26 – 2022-01-27 (×2): 3 mg via ORAL
  Filled 2022-01-26 (×2): qty 1

## 2022-01-26 NOTE — Progress Notes (Signed)
[address]       [place] [address]             [phone]      3 Days Post-Op  Procedure(s) (LRB): AORTIC VALVE REPLACEMENT (AVR) USING 27MM INSPIRIS RESILIA  AORTIC VALVE (N/A) CORONARY ARTERY BYPASS GRAFTING (CABGX1) USING ENDOSCOPICALLY HARVESTED RIGHT GREATER SAPHENOUS VEIN (N/A) TRANSESOPHAGEAL ECHOCARDIOGRAM (TEE) (N/A)   Total Length of Stay:  LOS: 3 days    SUBJECTIVE: Feels better Walked 4 times yesterday  Vitals:   01/26/22 0700 01/26/22 0800  BP:  122/85  Pulse: 99 95  Resp: 20 20  Temp: 98.1 F (36.7 C)   SpO2: 95% 93%    Intake/Output      11/02 0701 11/03 0700 11/03 0701 11/04 0700   P.O.  240   I.V. (mL/kg)     IV Piggyback     Total Intake(mL/kg)  240 (1.7)   Urine (mL/kg/hr) 1000 (0.3)    Chest Tube 60    Total Output 1060    Net -1060 +240        Urine Occurrence 3 x 1 x       sodium chloride     sodium chloride     sodium chloride 10 mL/hr at 01/24/22 2200   lactated ringers     lactated ringers     lactated ringers 20 mL/hr at 01/23/22 1215    CBC    Component Value Date/Time   WBC 10.3 01/26/2022 0318   RBC 2.86 (L) 01/26/2022 0318   HGB 9.1 (L) 01/26/2022 0318   HGB 14.9 11/22/2021 0909   HCT 26.1 (L) 01/26/2022 0318   HCT 43.4 11/22/2021 0909   PLT 102 (L) 01/26/2022 0318   PLT 164 11/22/2021 0909   MCV 91.3 01/26/2022 0318   MCV 91 11/22/2021 0909   MCH 31.8 01/26/2022 0318   MCHC 34.9 01/26/2022 0318   RDW 13.2 01/26/2022 0318   RDW 14.5 11/22/2021 0909   LYMPHSABS 1.4 11/22/2021 0909   MONOABS 0.4 05/13/2014 1031   EOSABS 0.2 11/22/2021 0909   BASOSABS 0.0 11/22/2021 0909   CMP     Component Value Date/Time   NA 135 01/26/2022 0318   NA 139 11/22/2021 0909   K 3.8 01/26/2022 0318   CL 100 01/26/2022 0318   CO2 26 01/26/2022 0318   GLUCOSE 144 (H) 01/26/2022 0318   BUN 21 01/26/2022 0318   BUN 10 11/22/2021 0909   CREATININE 0.83 01/26/2022 0318   CREATININE 0.94 12/15/2015  0914   CALCIUM 8.4 (L) 01/26/2022 0318   PROT 6.7 01/19/2022 1200   PROT 6.9 08/12/2017 1233   ALBUMIN 3.8 01/19/2022 1200   ALBUMIN 4.3 08/12/2017 1233   AST 18 01/19/2022 1200   ALT 24 01/19/2022 1200   ALKPHOS 53 01/19/2022 1200   BILITOT 0.9 01/19/2022 1200   BILITOT 0.6 08/12/2017 1233   GFRNONAA >60 01/26/2022 0318   GFRNONAA 85 12/15/2015 0914   GFRAA >60 11/02/2019 1030   GFRAA >89 12/15/2015 0914   ABG    Component Value Date/Time   PHART 7.321 (L) 01/23/2022 1725   PCO2ART 42.1 01/23/2022 1725   PO2ART 82 (L) 01/23/2022 1725   HCO3 21.6 01/23/2022 1725   TCO2 23 01/23/2022 1725   ACIDBASEDEF 4.0 (H) 01/23/2022 1725   O2SAT 95 01/23/2022 1725   CBG (last 3)  Recent Labs    01/25/22 1526 01/25/22 2222 01/26/22 0649  GLUCAP 149* 141* 150*  EXAM Lungs: clear but decreased left base Card: rr with soft murmur Ext; Warm    ASSESSMENT:  POD #3 SP AVR/CABG Having frequent PVCs: will increase his BB and add amiodarone Cont diuresis with IV today and go to PO tomorrow Hopefully home this weekend  Eugenio Hoes, MD @DATE @

## 2022-01-26 NOTE — Progress Notes (Signed)
NAME:  Malik [name], MRN:  [id number], DOB:  1950-04-13, LOS: 3 ADMISSION DATE:  01/23/2022, CONSULTATION DATE: 01/23/2022 REFERRING MD: Dr. Lavonna Monarch, CHIEF COMPLAINT: S/p CABG and AVR  History of Present Illness:  71 year old male with coronary artery disease, severe aortic stenosis, hypertension and hyperlipidemia who presented with increasing exertional chest pain and shortness of breath which has been getting worse for the past few months.  Patient denies dizziness, headache, vision problem or any other complaint.  On work-up patient was noted to have severe aortic stenosis and RCA stenosis.  Today patient underwent aortic valve replacement and CABG x1.  PCCM was consulted for help evaluation and medical management  Pertinent  Medical History   Past Medical History:  Diagnosis Date   Aortic stenosis    Arthritis    ASHD (arteriosclerotic heart disease)    Chest pressure    COVID-19 virus infection 11/2019   x 3   GERD (gastroesophageal reflux disease)    Heart murmur    History of kidney stones    Hyperglycemia    Hyperlipidemia    Hypertension    Nephrolithiasis    Obesity      Significant Hospital Events: Including procedures, antibiotic start and stop dates in addition to other pertinent events   10/30 AVR, CABG  Interim History / Subjective:  Hasn't slept well this week since being in ICU.   Objective   Blood pressure 124/67, pulse 99, temperature 98.1 F (36.7 C), temperature source Oral, resp. rate 20, weight (!) 139.8 kg, SpO2 95 %.        Intake/Output Summary (Last 24 hours) at 01/26/2022 0716 Last data filed at 01/25/2022 1800 Gross per 24 hour  Intake --  Output 1060 ml  Net -1060 ml    Filed Weights   01/24/22 0500 01/25/22 0500 01/26/22 0500  Weight: (!) 140.7 kg (!) 140.2 kg (!) 139.8 kg    Examination: Physical exam: General: elderly man sitting up in the chair in NAD HEENT: [place]/AT, eyes anicteric Neuro: awake and alert, moving all  extremities, answering questions appropriately, normal speech Chest: breathing comfortably on RA, CTAB Heart: irreg rhythm, runs of NSVT and frequent PVCs Abdomen: nondistended Skin: no rashes or wounds  BUN 21 Cr 0.83 WBC 10.3 H.H 9.1/26.1 CXR personally reviewed>  RIJ CVC, bilateral layering pleural effusions, pulm edema    Resolved Hospital Problem list     Assessment & Plan:  Coronary artery disease s/p CABG Severe aortic stenosis s/p AVR -aspirin, statin -metoprolol -post-op pain control with oxy, tramadol, morphine -tele monitoring -monitor electrolytes -appreciate TCTS management  NSVT -amiodarone -metoprolol -monitor electrolytes -tele monitoring  Post-op vent management, resolved Post-op atelectasis -lasix -pulmonary hygiene -mobility  Hypertension Hyperlipidemia -statin  Morbid obesity -recommend long term modest weight loss   Expected acute blood loss anemia/thrombocytopenia, postprocedure -no indication for transfusion, monitor  Hyperglycemia, pre-DM. A1c 5.9 -SSI PRN  Former tobacco abuse -may need lung cancer screening as OP depending on pack history  Wife updated at bedside. Stable to transfer to the floor per TCTS.  Best Practice (right click and "Reselect all SmartList Selections" daily)   Diet/type: Advance as tolerated DVT prophylaxis: SCD GI prophylaxis: PPI Lines: Central line and yes and it is still needed Foley: Discontinued today-. Code Status:  full code Last date of multidisciplinary goals of care discussion [Per primary team]  Labs   CBC: Recent Labs  Lab 01/23/22 1229 01/23/22 1236 01/23/22 1724 01/23/22 1725 01/24/22 0501 01/25/22 5621 01/26/22 3086  WBC 16.1*  --  15.6*  --  11.1* 12.6* 10.3  HGB 12.8*   < > 12.4* 11.6* 10.5* 10.2* 9.1*  HCT 37.4*   < > 35.2* 34.0* 31.0* 29.1* 26.1*  MCV 91.9  --  90.5  --  92.3 91.8 91.3  PLT 131*  --  157  --  139* 107* 102*   < > = values in this interval not  displayed.     Basic Metabolic Panel: Recent Labs  Lab 01/19/22 1200 01/23/22 0817 01/23/22 1115 01/23/22 1236 01/23/22 1724 01/23/22 1725 01/24/22 0501 01/25/22 0528 01/26/22 0318  NA 139   < > 138   < > 142 141 137 136 135  K 3.8   < > 3.9   < > 4.7 4.6 4.0 4.2 3.8  CL 108   < > 102  --  108  --  105 100 100  CO2 23  --   --   --  23  --  23 26 26   GLUCOSE 93   < > 197*  --  145*  --  128* 168* 144*  BUN 12   < > 10  --  10  --  13 22 21   CREATININE 0.78   < > 0.70  --  0.86  --  0.87 1.05 0.83  CALCIUM 9.3  --   --   --  8.6*  --  8.4* 8.7* 8.4*  MG  --   --   --   --  2.8*  --  2.4 2.2  --    < > = values in this interval not displayed.    GFR: Estimated Creatinine Clearance: 116.6 mL/min (by C-G formula based on SCr of 0.83 mg/dL). Recent Labs  Lab 01/23/22 1724 01/24/22 0501 01/25/22 0528 01/26/22 0318  WBC 15.6* 11.1* 12.6* 10.3    Julian Hy, DO 01/26/22 1:16 PM [facility]

## 2022-01-26 NOTE — Progress Notes (Signed)
      [address]       [place] [address]             [phone]      POD # 3 AVR, CABG x 1  Up in chair  BP (!) 113/55   Pulse 77   Temp 98.1 F (36.7 C) (Oral)   Resp (!) 21   Wt (!) 139.8 kg   SpO2 92%   BMI 41.79 kg/m   Intake/Output Summary (Last 24 hours) at 01/26/2022 1732 Last data filed at 01/26/2022 1700 Gross per 24 hour  Intake 480 ml  Output 1350 ml  Net -870 ml   CBG mildly elevated  Transfer to 4E in AM  [place] C. Roxan Hockey, MD [facility] [phone]

## 2022-01-27 LAB — GLUCOSE, CAPILLARY
Glucose-Capillary: 134 mg/dL — ABNORMAL HIGH (ref 70–99)
Glucose-Capillary: 159 mg/dL — ABNORMAL HIGH (ref 70–99)
Glucose-Capillary: 138 mg/dL — ABNORMAL HIGH (ref 70–99)
Glucose-Capillary: 116 mg/dL — ABNORMAL HIGH (ref 70–99)

## 2022-01-27 MED ORDER — FUROSEMIDE 40 MG PO TABS
40.0000 mg | ORAL_TABLET | Freq: Two times a day (BID) | ORAL | Status: DC
  Administered 2022-01-27 – 2022-01-28 (×2): 40 mg via ORAL
  Filled 2022-01-27 (×2): qty 1

## 2022-01-27 NOTE — Progress Notes (Signed)
4 Days Post-Op Procedure(s) (LRB): AORTIC VALVE REPLACEMENT (AVR) USING 27MM INSPIRIS RESILIA  AORTIC VALVE (N/A) CORONARY ARTERY BYPASS GRAFTING (CABGX1) USING ENDOSCOPICALLY HARVESTED RIGHT GREATER SAPHENOUS VEIN (N/A) TRANSESOPHAGEAL ECHOCARDIOGRAM (TEE) (N/A) Subjective: No complaints  Objective: Vital signs in last 24 hours: Temp:  [97.7 F (36.5 C)-98.2 F (36.8 C)] 97.7 F (36.5 C) (11/03 2112) Pulse Rate:  [61-93] 82 (11/04 0800) Cardiac Rhythm: Normal sinus rhythm (11/04 0800) Resp:  [15-25] 17 (11/04 0800) BP: (83-123)/(45-70) 123/68 (11/04 0800) SpO2:  [88 %-94 %] 93 % (11/04 0800) Weight:  [751 kg] 139 kg (11/04 0400)  Hemodynamic parameters for last 24 hours:    Intake/Output from previous day: 11/03 0701 - 11/04 0700 In: 480 [P.O.:480] Out: 1150 [Urine:1150] Intake/Output this shift: No intake/output data recorded.  General appearance: alert, cooperative, and no distress Neurologic: intact Heart: regular rate and rhythm Lungs: diminished breath sounds bibasilar Wound: clean and intact  Lab Results: Recent Labs    01/25/22 0528 01/26/22 0318  WBC 12.6* 10.3  HGB 10.2* 9.1*  HCT 29.1* 26.1*  PLT 107* 102*   BMET:  Recent Labs    01/25/22 0528 01/26/22 0318  NA 136 135  K 4.2 3.8  CL 100 100  CO2 26 26  GLUCOSE 168* 144*  BUN 22 21  CREATININE 1.05 0.83  CALCIUM 8.7* 8.4*    PT/INR: No results for input(s): "LABPROT", "INR" in the last 72 hours. ABG    Component Value Date/Time   PHART 7.321 (L) 01/23/2022 1725   HCO3 21.6 01/23/2022 1725   TCO2 23 01/23/2022 1725   ACIDBASEDEF 4.0 (H) 01/23/2022 1725   O2SAT 95 01/23/2022 1725   CBG (last 3)  Recent Labs    01/26/22 1557 01/26/22 2105 01/27/22 0645  GLUCAP 138* 177* 134*    Assessment/Plan: S/P Procedure(s) (LRB): AORTIC VALVE REPLACEMENT (AVR) USING 27MM INSPIRIS RESILIA  AORTIC VALVE (N/A) CORONARY ARTERY BYPASS GRAFTING (CABGX1) USING ENDOSCOPICALLY HARVESTED RIGHT  GREATER SAPHENOUS VEIN (N/A) TRANSESOPHAGEAL ECHOCARDIOGRAM (TEE) (N/A) POD # 4 Doing well, awaiting tele bed CV- in SR on amiodarone  ASA, beta blocker, statin RESP- continue IS RENAL- creatinine and lytes OK  Continue PO Lasix ENDO- CBG mildly elevated GI- tolerating PO Continue cardiac rehab Possibly home in Am   LOS: 4 days    Melrose Nakayama 01/27/2022

## 2022-01-27 NOTE — TOC Initial Note (Addendum)
Transition of Care [facility]) - Initial/Assessment Note    Patient Details  Name: Malik [name] MRN: [id number] Date of Birth: 1951/03/20  Transition of Care [facility]) CM/SW Contact:    Erenest Rasher, RN Phone Number: [phone]  01/27/2022, 1:52 PM  Clinical Narrative:                  TOC CM spoke to pt at bedside. States his SO, Vaughan Basta provides transportation to appts. Did not request any DME. Possible dc this weekend.   Pt requesting a [place] for home. [place] for RW for home.    Expected Discharge Plan: Home/Self Care Barriers to Discharge: Continued Medical Work up   Patient Goals and CMS Choice Patient states their goals for this hospitalization and ongoing recovery are:: wants to remain independent      Expected Discharge Plan and Services Expected Discharge Plan: Home/Self Care   Discharge Planning Services: CM Consult   Living arrangements for the past 2 months: Single Family Home                                      Prior Living Arrangements/Services Living arrangements for the past 2 months: Single Family Home Lives with:: Significant Other Patient language and need for interpreter reviewed:: Yes Do you feel safe going back to the place where you live?: Yes      Need for Family Participation in Patient Care: No (Comment) Care giver support system in place?: Yes (comment)   Criminal Activity/Legal Involvement Pertinent to Current Situation/Hospitalization: No - Comment as needed  Activities of Daily Living      Permission Sought/Granted Permission sought to share information with : Case Manager, PCP, Family Supports Permission granted to share information with : Yes, Verbal Permission Granted  Share Information with NAME: Sandre Kitty     Permission granted to share info w Relationship: SO  Permission granted to share info w Contact Information: [phone]  Emotional Assessment Appearance:: Appears stated  age Attitude/Demeanor/Rapport: Engaged Affect (typically observed): Accepting Orientation: : Oriented to Self, Oriented to Place, Oriented to  Time, Oriented to Situation   Psych Involvement: No (comment)  Admission diagnosis:  S/P aortic valve replacement [Z95.2] Patient Active Problem List   Diagnosis Date Noted   S/P aortic valve replacement 01/23/2022   COVID-19 virus infection 11/2019   Solitary pulmonary nodule 05/13/2014   Severe aortic stenosis 01/06/2013   Murmur 11/17/2012   Dyspnea 11/17/2012   Hypertension 12/18/2011   Hyperglycemia 12/18/2011   Hyperlipidemia 12/18/2011   Arthritis 12/18/2011   Obesity 12/18/2011   PCP:  Reynold Bowen, MD Pharmacy:   [place], [place] - [address] [place] [address] Phone: [phone] Fax: [phone]  [facility] - [place], [place] - [address] [place] [address] Phone: [phone] Fax: [phone]  [facility] [place], [place] - [address] AT [facility] [address] [place] [address] [place] [address] Phone: [phone] Fax: [place], [place] - [place] [address] AT [place] [address] [place] [address] Phone: [phone] Fax: [phone]     Social Determinants of Health (SDOH) Interventions    Readmission Risk Interventions     No data to  display

## 2022-01-27 NOTE — Progress Notes (Signed)
CARDIAC REHAB PHASE I   PRE:  Rate/Rhythm: SR with PVC's  BP:  Sitting:109/49      SaO2: 98%  MODE:  Ambulation: 370 ft   POST:  Rate/Rhythm: SR with PVC's  BP:  Sitting: 144/69      SaO2: 98%  Pt ambulated using rolling walker and gait belt. Pt ambulated with steady gait using walker. Pt had C/O SOB at end of walk with SaO2 of 94%. No other voiced complaints. Pt back to chair family at side. I/S encouraged 10 x/hr: Pt pulled 200 mL.  Completed post AVR and CABG education with patient and family. Reviewed the [facility] booklet with and provided handouts on low Na and heart healthy diets. Reviewed activity restriction and S/S to report to MD. Stressed medication compliance and daily weights. Reviewed S/S of fluid overload. Stressed low Na diet. Reviewed exercise guidelines and S/S to terminate exercise. Pt referred the the [facility] CRP2 program. Reviewed wound care and precautions. Reviewed bed mobility and the move-in-the-tube precautions.Before leaving patient and family put the recovery from heart surgery video on the TV. All questions answered. Pt would benefit form rolling walker at discharge; primary RN notified of this.   Lesly Rubenstein, MS, ACSM EP-C, CCRP 01/27/2022  11:00 - 12:55

## 2022-01-27 NOTE — Progress Notes (Signed)
      [address]       [place], [address]             [phone]      No new issues Awaiting bed on 4E  BP 92/71   Pulse 76   Temp 98.1 F (36.7 C) (Oral)   Resp 16   Wt (!) 139 kg   SpO2 95%   BMI 41.56 kg/m    Remo Lipps C. Roxan Hockey, MD [facility] [phone]

## 2022-01-28 LAB — GLUCOSE, CAPILLARY
Glucose-Capillary: 125 mg/dL — ABNORMAL HIGH (ref 70–99)
Glucose-Capillary: 125 mg/dL — ABNORMAL HIGH (ref 70–99)

## 2022-01-28 MED ORDER — TRAMADOL HCL 50 MG PO TABS: 50.0000 mg | ORAL_TABLET | Freq: Four times a day (QID) | ORAL | 0 refills | Status: AC | PRN

## 2022-01-28 MED ORDER — ACETAMINOPHEN 325 MG PO TABS: 650.0000 mg | ORAL_TABLET | ORAL | Status: AC | PRN

## 2022-01-28 MED ORDER — AMIODARONE HCL 200 MG PO TABS: 200.0000 mg | ORAL_TABLET | Freq: Two times a day (BID) | ORAL | 1 refills | Status: DC

## 2022-01-28 MED ORDER — ATORVASTATIN CALCIUM 80 MG PO TABS: 80.0000 mg | ORAL_TABLET | Freq: Every day | ORAL | 2 refills | Status: DC

## 2022-01-28 MED ORDER — ASPIRIN 325 MG PO TBEC: 325.0000 mg | DELAYED_RELEASE_TABLET | Freq: Every day | ORAL | Status: DC

## 2022-01-28 MED ORDER — FUROSEMIDE 40 MG PO TABS: 40.0000 mg | ORAL_TABLET | Freq: Every day | ORAL | 0 refills | Status: DC

## 2022-01-28 MED ORDER — POTASSIUM CHLORIDE CRYS ER 20 MEQ PO TBCR: 20.0000 meq | EXTENDED_RELEASE_TABLET | Freq: Every day | ORAL | 0 refills | Status: DC

## 2022-01-28 NOTE — Progress Notes (Signed)
5 Days Post-Op Procedure(s) (LRB): AORTIC VALVE REPLACEMENT (AVR) USING 27MM INSPIRIS RESILIA  AORTIC VALVE (N/A) CORONARY ARTERY BYPASS GRAFTING (CABGX1) USING ENDOSCOPICALLY HARVESTED RIGHT GREATER SAPHENOUS VEIN (N/A) TRANSESOPHAGEAL ECHOCARDIOGRAM (TEE) (N/A) Subjective: No complaints,  ambulating well, tolerating diet  Objective: Vital signs in last 24 hours: Temp:  [97.6 F (36.4 C)-99 F (37.2 C)] 97.6 F (36.4 C) (11/05 0500) Pulse Rate:  [67-85] 80 (11/05 0800) Cardiac Rhythm: Normal sinus rhythm (11/04 2000) Resp:  [15-27] 18 (11/05 0800) BP: (92-140)/(43-86) 130/66 (11/05 0800) SpO2:  [92 %-98 %] 96 % (11/05 0800) Weight:  [136.1 kg] 136.1 kg (11/05 0500)  Hemodynamic parameters for last 24 hours:    Intake/Output from previous day: 11/04 0701 - 11/05 0700 In: 240 [P.O.:240] Out: 550 [Urine:550] Intake/Output this shift: Total I/O In: 240 [P.O.:240] Out: -   General appearance: alert, cooperative, and no distress Neurologic: intact Heart: regular rate and rhythm Lungs: diminished breath sounds bibasilar Abdomen: normal findings: soft, non-tender Wound: clean and dry  Lab Results: Recent Labs    01/26/22 0318  WBC 10.3  HGB 9.1*  HCT 26.1*  PLT 102*   BMET:  Recent Labs    01/26/22 0318  NA 135  K 3.8  CL 100  CO2 26  GLUCOSE 144*  BUN 21  CREATININE 0.83  CALCIUM 8.4*    PT/INR: No results for input(s): "LABPROT", "INR" in the last 72 hours. ABG    Component Value Date/Time   PHART 7.321 (L) 01/23/2022 1725   HCO3 21.6 01/23/2022 1725   TCO2 23 01/23/2022 1725   ACIDBASEDEF 4.0 (H) 01/23/2022 1725   O2SAT 95 01/23/2022 1725   CBG (last 3)  Recent Labs    01/27/22 1609 01/27/22 2203 01/28/22 0655  GLUCAP 138* 116* 125*    Assessment/Plan: S/P Procedure(s) (LRB): AORTIC VALVE REPLACEMENT (AVR) USING 27MM INSPIRIS RESILIA  AORTIC VALVE (N/A) CORONARY ARTERY BYPASS GRAFTING (CABGX1) USING ENDOSCOPICALLY HARVESTED RIGHT GREATER  SAPHENOUS VEIN (N/A) TRANSESOPHAGEAL ECHOCARDIOGRAM (TEE) (N/A) Plan for discharge: see discharge orders Doing well POD # 5 Maintaining SR Tolerating diet, + BM Good exercise tolerance Will dc home later today   LOS: 5 days    Melrose Nakayama 01/28/2022

## 2022-02-01 MED FILL — Electrolyte-R (PH 7.4) Solution: INTRAVENOUS | Qty: 3000 | Status: AC

## 2022-02-01 MED FILL — Heparin Sodium (Porcine) Inj 1000 Unit/ML: Qty: 1000 | Status: AC

## 2022-02-01 MED FILL — Lidocaine HCl Local Preservative Free (PF) Inj 2%: INTRAMUSCULAR | Qty: 14 | Status: AC

## 2022-02-01 MED FILL — Mannitol IV Soln 20%: INTRAVENOUS | Qty: 500 | Status: AC

## 2022-02-01 MED FILL — Heparin Sodium (Porcine) Inj 1000 Unit/ML: INTRAMUSCULAR | Qty: 30 | Status: AC

## 2022-02-01 MED FILL — Sodium Bicarbonate IV Soln 8.4%: INTRAVENOUS | Qty: 50 | Status: AC

## 2022-02-01 MED FILL — Sodium Chloride IV Soln 0.9%: INTRAVENOUS | Qty: 2000 | Status: AC

## 2022-02-01 MED FILL — Potassium Chloride Inj 2 mEq/ML: INTRAVENOUS | Qty: 40 | Status: AC

## 2022-02-05 ENCOUNTER — Other Ambulatory Visit: Payer: Self-pay | Admitting: Thoracic Surgery (Cardiothoracic Vascular Surgery)

## 2022-02-05 DIAGNOSIS — Z952 Presence of prosthetic heart valve: Secondary | ICD-10-CM

## 2022-02-06 ENCOUNTER — Ambulatory Visit (INDEPENDENT_AMBULATORY_CARE_PROVIDER_SITE_OTHER): Payer: Self-pay | Admitting: Surgical

## 2022-02-06 ENCOUNTER — Ambulatory Visit
Admission: RE | Admit: 2022-02-06 | Discharge: 2022-02-06 | Disposition: A | Discharge status: Home / Self Care | Payer: Medicare Other | Source: Ambulatory Visit | Attending: Thoracic Surgery (Cardiothoracic Vascular Surgery) | Admitting: Thoracic Surgery (Cardiothoracic Vascular Surgery)

## 2022-02-06 VITALS — BP 138/83 | HR 72 | Resp 18 | Ht 72.0 in | Wt 303.0 lb

## 2022-02-06 DIAGNOSIS — Z952 Presence of prosthetic heart valve: Secondary | ICD-10-CM

## 2022-02-06 DIAGNOSIS — R059 Cough, unspecified: Secondary | ICD-10-CM | POA: Diagnosis not present

## 2022-02-06 MED ORDER — FUROSEMIDE 40 MG PO TABS: 40.0000 mg | ORAL_TABLET | Freq: Every day | ORAL | 0 refills | Status: DC

## 2022-02-06 MED ORDER — POTASSIUM CHLORIDE CRYS ER 20 MEQ PO TBCR: 20.0000 meq | EXTENDED_RELEASE_TABLET | Freq: Every day | ORAL | 0 refills | Status: DC

## 2022-02-06 NOTE — Patient Instructions (Signed)
Activity progression and restrictions reiterated.

## 2022-02-06 NOTE — Progress Notes (Signed)
[address]       [place] [address]             [phone]      DRAGO HAMMONDS [facility] Medical Record #[id number] Date of Birth: 1950-10-31  Referring: Wendall Stade, MD Primary Care: Adrian Prince, MD Primary Cardiologist: [name], MD   Chief Complaint:   POST OP FOLLOW UP   01/23/2022 Malik [name] [id number]   Surgeon:  Ashley Akin, MD   First Assistant: Jillyn Hidden [facility]                                      Preoperative Diagnosis:  Severe aortic stenosis                                             Coronary artery disease      CABG x1-saphenous vein graft to the RCA Postoperative Diagnosis:  Same     Procedure:   Median Sternotomy Extracorporeal circulation 3.   Aortic valve replacement using a 55mm  Inspiris Pericardial valve. [facility] [id number])   Anesthesia:  Jairo Ben MD General Endotracheal     History of Present Illness:    The patient is a 71 year old male status post the above described procedure seen in the office and postsurgical follow-up.  He reports that his primary difficulty has been sleeping which is a long-term problem.  He does have some discomfort in the sternotomy and occasionally uses pain medication to help him sleep.  He denies fevers, chills or other significant constitutional symptoms and has had no difficulty with his incisions.  He has some mild shortness of breath and notes that he does sleep in a recliner.  He is not having any chest pain/anginal equivalents.  He does have bilateral lower extremity edema.      Past Medical History:  Diagnosis Date   Aortic stenosis    Arthritis    ASHD (arteriosclerotic heart disease)    Chest pressure    COVID-19 virus infection 11/2019   x 3   GERD (gastroesophageal reflux disease)    Heart murmur    History of kidney stones    Hyperglycemia    Hyperlipidemia    Hypertension    Nephrolithiasis    Obesity      Social History   Tobacco  Use  Smoking Status Former   Years: 20.00   Types: Cigarettes, Cigars   Quit date: 08/19/1978   Years since quitting: 43.4  Smokeless Tobacco Former    Social History   Substance and Sexual Activity  Alcohol Use No     No Known Allergies  Current Outpatient Medications  Medication Sig Dispense Refill   acetaminophen (TYLENOL) 325 MG tablet Take 2 tablets (650 mg total) by mouth every 4 (four) hours as needed.     amiodarone (PACERONE) 200 MG tablet Take 1 tablet (200 mg total) by mouth 2 (two) times daily. For 14 days then reduce the dose to 1 tablet (200 mg) by mouth once daily. 60 tablet 1   aspirin EC 325 MG tablet Take 1 tablet (325 mg total) by mouth daily.     atorvastatin (LIPITOR) 80 MG tablet Take 1 tablet (80 mg total) by mouth daily. 30 tablet 2  furosemide (LASIX) 40 MG tablet Take 1 tablet (40 mg total) by mouth daily. 14 tablet 0   latanoprost (XALATAN) 0.005 % ophthalmic solution Place 1 drop into both eyes at bedtime.     metoprolol succinate (TOPROL-XL) 100 MG 24 hr tablet Take 1 tablet (100 mg total) by mouth daily. Take with or immediately following a meal. (Patient taking differently: Take 100 mg by mouth at bedtime. Take with or immediately following a meal.) 90 tablet 1   potassium chloride SA (KLOR-CON M) 20 MEQ tablet Take 1 tablet (20 mEq total) by mouth daily. 14 tablet 0   No current facility-administered medications for this visit.       Physical Exam: BP 138/83 (BP Location: Right Arm, Patient Position: Sitting)   Pulse 72   Resp 18   Ht 6' (1.829 m)   Wt (!) 303 lb (137.4 kg)   SpO2 93% Comment: RA  BMI 41.09 kg/m   General appearance: alert, cooperative, and no distress Heart: regular rate and rhythm and soft aortic systolic murmur Lungs: Mildly diminished in the bases Abdomen: Benign/obese Extremities: Bilateral lower extremity edema Wound: Incisions healing well without evidence of infection.   Diagnostic Studies & Laboratory  data:     Recent Radiology Findings:   DG Chest 2 View  Result Date: 02/06/2022 CLINICAL DATA:  Post aortic valve replacement on 01/23/2022 with cough. EXAM: CHEST - 2 VIEW COMPARISON:  January 23, 2022. FINDINGS: Post median sternotomy for CABG and with signs of aortic valve replacement. Small bilateral pleural effusions. Elevated LEFT hemidiaphragm and LEFT and RIGHT basilar airspace disease, LEFT greater than RIGHT. No sign of pneumothorax. On limited assessment no acute skeletal process. IMPRESSION: 1. Post median sternotomy for CABG and aortic valve replacement. 2. Small bilateral pleural effusions. 3. Bibasilar airspace disease LEFT greater than RIGHT in the setting of LEFT hemidiaphragmatic elevation. Nonspecific, potentially related atelectasis in the setting of small effusions. Correlate with any signs of infection. Electronically Signed   By: Zetta Bills M.D.   On: 02/06/2022 14:38      Recent Lab Findings: Lab Results  Component Value Date   WBC 10.3 01/26/2022   HGB 9.1 (L) 01/26/2022   HCT 26.1 (L) 01/26/2022   PLT 102 (L) 01/26/2022   GLUCOSE 144 (H) 01/26/2022   CHOL 144 08/12/2017   TRIG 117 08/12/2017   HDL 30 (L) 08/12/2017   LDLCALC 91 08/12/2017   ALT 24 01/19/2022   AST 18 01/19/2022   NA 135 01/26/2022   K 3.8 01/26/2022   CL 100 01/26/2022   CREATININE 0.83 01/26/2022   BUN 21 01/26/2022   CO2 26 01/26/2022   TSH 1.521 05/13/2014   INR 1.3 (H) 01/23/2022   HGBA1C 5.9 (H) 01/19/2022      Assessment / Plan: The patient is overall doing quite well.  With his small effusions and lower extremity edema I feel it would be worthwhile for a short course of Lasix and potassium.  We will prescribe 2 weeks course.  See below.  I did not make any other changes to his medication regimen.  He did bring in a copy of his blood pressure readings at home and they are for the most part very good he did have a couple readings in the [id number] systolic range but overall  excellent control.  We discussed ongoing activity restrictions and protocols.  We will see the patient in 2 weeks in follow-up.  Hopefully he will not require a thoracentesis.  His cardiac  function was excellent preoperatively.      Medication Changes: Meds ordered this encounter  Medications   furosemide (LASIX) 40 MG tablet    Sig: Take 1 tablet (40 mg total) by mouth daily.    Dispense:  14 tablet    Refill:  0    Order Specific Question:   Supervising Provider    Answer:   Coralie Common [id number]   potassium chloride SA (KLOR-CON M) 20 MEQ tablet    Sig: Take 1 tablet (20 mEq total) by mouth daily.    Dispense:  14 tablet    Refill:  0    Order Specific Question:   Supervising Provider    Answer:   Coralie Common [id number]      John Giovanni, PA-C  02/06/2022 3:05 PM

## 2022-02-13 ENCOUNTER — Other Ambulatory Visit: Payer: Self-pay | Admitting: Thoracic Surgery (Cardiothoracic Vascular Surgery)

## 2022-02-13 DIAGNOSIS — Z952 Presence of prosthetic heart valve: Secondary | ICD-10-CM

## 2022-02-13 NOTE — Progress Notes (Unsigned)
Office Visit    Patient Name: Malik [name] Date of Encounter: 02/13/2022  Primary Care Provider:  Reynold Bowen, MD Primary Cardiologist:  Malik Rouge, MD Primary Electrophysiologist: None  Chief Complaint    Malik [name] is a 71 y.o. male with PMH of CAD s/p CABG x 1 and AVR with bioprosthetic valve, HTN, HLD, pulmonary nodule, frequent PVCs, GERD who presents today for posthospital follow-up.  Past Medical History    Past Medical History:  Diagnosis Date   Aortic stenosis    Arthritis    ASHD (arteriosclerotic heart disease)    Chest pressure    COVID-19 virus infection 11/2019   x 3   GERD (gastroesophageal reflux disease)    Heart murmur    History of kidney stones    Hyperglycemia    Hyperlipidemia    Hypertension    Nephrolithiasis    Obesity    Past Surgical History:  Procedure Laterality Date   AORTIC VALVE REPLACEMENT N/A 01/23/2022   Procedure: AORTIC VALVE REPLACEMENT (AVR) USING 27MM INSPIRIS RESILIA  AORTIC VALVE;  Surgeon: Malik Common, MD;  Location: [place];  Service: Open Heart Surgery;  Laterality: N/A;  Mid sternotomy   APPENDECTOMY     CIRCUMCISION N/A 11/03/2019   Procedure: CIRCUMCISION ADULT;  Surgeon: Malik Seal, MD;  Location: [facility];  Service: Urology;  Laterality: N/A;   CORONARY ARTERY BYPASS GRAFT N/A 01/23/2022   Procedure: CORONARY ARTERY BYPASS GRAFTING (CABGX1) USING ENDOSCOPICALLY HARVESTED RIGHT GREATER SAPHENOUS VEIN;  Surgeon: Malik Common, MD;  Location: [place];  Service: Open Heart Surgery;  Laterality: N/A;   RIGHT/LEFT HEART CATH AND CORONARY ANGIOGRAPHY N/A 11/29/2021   Procedure: RIGHT/LEFT HEART CATH AND CORONARY ANGIOGRAPHY;  Surgeon: Malik Mocha, MD;  Location: [place] [facility];  Service: Cardiovascular;  Laterality: N/A;   TEE WITHOUT CARDIOVERSION N/A 01/23/2022   Procedure: TRANSESOPHAGEAL ECHOCARDIOGRAM (TEE);  Surgeon: Malik Common, MD;  Location: [place];  Service: Open Heart Surgery;  Laterality: N/A;     Allergies  No Known Allergies  History of Present Illness    Malik [name]  is a 71 year old male with the above mention past medical history who presents today for follow-up of recent CABG and AVR.  Malik [name] was initially seen by Malik [name] on 10/2012 for management of murmur.  He had complaint of exertional dyspnea and underwent ETT that was normal.  Coronary calcium score was obtained that showed 66 percentile score of 114 with 5 mm left upper lobe lung nodule.  He was then seen in 2016 for evaluation of aortic stenosis.  2D echo was completed 9/14 that revealed EF of 55-60% with mild LVH and grade 2 DD with moderate AVR and mildly dilated LA.  He developed exertional angina over the past year and presented on 11/08/2021 for structural workup.  He underwent R/LHC on 9/6 that revealed a high grade 80% stenosis of the ostium of the RCA and also 70% of a distal AV groove OM. He underwent TAVR CTA which has adequate anatomy for TAVR.  Decision was made for long-term management to undergo CABG with AVR.  He underwent successful procedure on 10/31 with 27 mm bioprosthetic valve with saphenous vein graft to the RCA.  Patient did well postprocedure and developed PVCs with subsequent amiodarone initiated with diuretics for volume overload.  He was eventually transition to beta-blocker therapy and was discharged in stable condition 01/28/2022.  Since last being seen in the office patient reports***.  Patient  denies chest pain, palpitations, dyspnea, PND, orthopnea, nausea, vomiting, dizziness, syncope, edema, weight gain, or early satiety.   ***Notes: SBE prophylaxis Home Medications    Current Outpatient Medications  Medication Sig Dispense Refill   acetaminophen (TYLENOL) 325 MG tablet Take 2 tablets (650 mg total) by mouth every 4 (four) hours as needed.     amiodarone (PACERONE) 200 MG tablet Take 1 tablet (200 mg total) by mouth 2 (two) times daily. For 14 days then reduce the dose to 1  tablet (200 mg) by mouth once daily. 60 tablet 1   aspirin EC 325 MG tablet Take 1 tablet (325 mg total) by mouth daily.     atorvastatin (LIPITOR) 80 MG tablet Take 1 tablet (80 mg total) by mouth daily. 30 tablet 2   furosemide (LASIX) 40 MG tablet Take 1 tablet (40 mg total) by mouth daily. 14 tablet 0   latanoprost (XALATAN) 0.005 % ophthalmic solution Place 1 drop into both eyes at bedtime.     metoprolol succinate (TOPROL-XL) 100 MG 24 hr tablet Take 1 tablet (100 mg total) by mouth daily. Take with or immediately following a meal. (Patient taking differently: Take 100 mg by mouth at bedtime. Take with or immediately following a meal.) 90 tablet 1   potassium chloride SA (KLOR-CON M) 20 MEQ tablet Take 1 tablet (20 mEq total) by mouth daily. 14 tablet 0   No current facility-administered medications for this visit.     Review of Systems  Please see the history of present illness.    (+)*** (+)***  All other systems reviewed and are otherwise negative except as noted above.  Physical Exam    Wt Readings from Last 3 Encounters:  02/06/22 (!) 303 lb (137.4 kg)  01/28/22 (!) 300 lb 0.7 oz (136.1 kg)  01/19/22 (!) 302 lb 9.6 oz (137.3 kg)   [id number] were no vitals filed for this visit.,There is no height or weight on file to calculate BMI.  Constitutional:      Appearance: Healthy appearance. Not in distress.  Neck:     Vascular: JVD normal.  Pulmonary:     Effort: Pulmonary effort is normal.     Breath sounds: No wheezing. No rales. Diminished in the bases Cardiovascular:     Normal rate. Regular rhythm. Normal S1. Normal S2.      Murmurs: There is no murmur.  Edema:    Peripheral edema absent.  Abdominal:     Palpations: Abdomen is soft non tender. There is no hepatomegaly.  Skin:    General: Skin is warm and dry.  Neurological:     General: No focal deficit present.     Mental Status: Alert and oriented to person, place and time.     Cranial Nerves: Cranial nerves  are intact.  EKG/LABS/Other Studies Reviewed    ECG personally reviewed by me today - ***  Risk Assessment/Calculations:   {Does this patient have ATRIAL FIBRILLATION?:[phone]}        Lab Results  Component Value Date   WBC 10.3 01/26/2022   HGB 9.1 (L) 01/26/2022   HCT 26.1 (L) 01/26/2022   MCV 91.3 01/26/2022   PLT 102 (L) 01/26/2022   Lab Results  Component Value Date   CREATININE 0.83 01/26/2022   BUN 21 01/26/2022   NA 135 01/26/2022   K 3.8 01/26/2022   CL 100 01/26/2022   CO2 26 01/26/2022   Lab Results  Component Value Date   ALT 24 01/19/2022   AST  18 01/19/2022   ALKPHOS 53 01/19/2022   BILITOT 0.9 01/19/2022   Lab Results  Component Value Date   CHOL 144 08/12/2017   HDL 30 (L) 08/12/2017   LDLCALC 91 08/12/2017   TRIG 117 08/12/2017   CHOLHDL 4.8 08/12/2017    Lab Results  Component Value Date   HGBA1C 5.9 (H) 01/19/2022    Assessment & Plan    1.  Coronary artery disease: -s/p CABG x 1 for treatment of single-vessel severe ostial stenosis with SVG to RCA -Today patient reports*** -Continue GDMT with ASA 325, Lipitor 80 mg, Toprol 100 mg  2.  Severe aortic stenosis: -Severe calcific aortic stenosis with mean transvalvular gradient 50 mmHg, peak to peak  -s/p AVR repair with 27 mm bioprosthetic -Today patient reports*** -SBE prophylaxis discussed  3.  Essential hypertension: -Patient's blood pressure today was***  4.  Hyperlipidemia: -Patient's LDL cholesterol was 91 in 2951 -Continue atorvastatin 80 mg  {The patient has an active order for outpatient cardiac rehabilitation.   Please indicate if the patient is ready to start. Do NOT delete this.  It will auto delete.  Refresh note, then sign.              Click here to document readiness and see contraindications.  :1}  Cardiac Rehabilitation Eligibility Assessment       Disposition: Follow-up with [name], MD or APP in *** months {Are you ordering a CV Procedure (e.g.  stress test, cath, DCCV, TEE, etc)?   Press F2        :[id number]}   Medication Adjustments/Labs and Tests Ordered: Current medicines are reviewed at length with the patient today.  Concerns regarding medicines are outlined above.   Signed, Napoleon Form, Leodis Rains, NP 02/13/2022, 1:11 PM [facility]  Note:  This document was prepared using Dragon voice recognition software and may include unintentional dictation errors.

## 2022-02-14 ENCOUNTER — Encounter: Payer: Self-pay | Admitting: Nurse Practitioner

## 2022-02-14 ENCOUNTER — Ambulatory Visit: Payer: Medicare Other | Attending: Nurse Practitioner | Admitting: Nurse Practitioner

## 2022-02-14 VITALS — BP 130/70 | HR 54 | Ht 72.0 in | Wt 297.0 lb

## 2022-02-14 DIAGNOSIS — E785 Hyperlipidemia, unspecified: Secondary | ICD-10-CM | POA: Diagnosis not present

## 2022-02-14 DIAGNOSIS — Z6841 Body Mass Index (BMI) 40.0 and over, adult: Secondary | ICD-10-CM

## 2022-02-14 DIAGNOSIS — Z952 Presence of prosthetic heart valve: Secondary | ICD-10-CM

## 2022-02-14 DIAGNOSIS — I2510 Atherosclerotic heart disease of native coronary artery without angina pectoris: Secondary | ICD-10-CM

## 2022-02-14 DIAGNOSIS — I4891 Unspecified atrial fibrillation: Secondary | ICD-10-CM

## 2022-02-14 DIAGNOSIS — I10 Essential (primary) hypertension: Secondary | ICD-10-CM | POA: Diagnosis not present

## 2022-02-14 DIAGNOSIS — R600 Localized edema: Secondary | ICD-10-CM

## 2022-02-14 DIAGNOSIS — I9789 Other postprocedural complications and disorders of the circulatory system, not elsewhere classified: Secondary | ICD-10-CM

## 2022-02-14 NOTE — Patient Instructions (Signed)
Medication Instructions:  Your physician recommends that you continue on your current medications as directed. Please refer to the Current Medication list given to you today. *If you need a refill on your cardiac medications before your next appointment, please call your pharmacy*   Lab Work: TODAY-BMET & MAG If you have labs (blood work) drawn today and your tests are completely normal, you will receive your results only by: MyChart Message (if you have MyChart) OR A paper copy in the mail If you have any lab test that is abnormal or we need to change your treatment, we will call you to review the results.   Testing/Procedures: NONE ORDERED   Follow-Up: At [facility], you and your health needs are our priority.  As part of our continuing mission to provide you with exceptional heart care, we have created designated Provider Care Teams.  These Care Teams include your primary Cardiologist (physician) and Advanced Practice Providers (APPs -  Physician Assistants and Nurse Practitioners) who all work together to provide you with the care you need, when you need it.  We recommend signing up for the patient portal called "MyChart".  Sign up information is provided on this After Visit Summary.  MyChart is used to connect with patients for Virtual Visits (Telemedicine).  Patients are able to view lab/test results, encounter notes, upcoming appointments, etc.  Non-urgent messages can be sent to your provider as well.   To learn more about what you can do with MyChart, go to [email or web address].    Your next appointment:   1 month(s)  The format for your next appointment:   In Person  Provider:   [name], MD  or Robin Searing, NP   Other Instructions   Important Information About Sugar

## 2022-02-15 LAB — MAGNESIUM: Magnesium: 2.2 mg/dL (ref 1.6–2.3)

## 2022-02-15 LAB — BASIC METABOLIC PANEL
BUN/Creatinine Ratio: 11 (ref 10–24)
BUN: 11 mg/dL (ref 8–27)
CO2: 25 mmol/L (ref 20–29)
Calcium: 9.5 mg/dL (ref 8.6–10.2)
Chloride: 102 mmol/L (ref 96–106)
Creatinine, Ser: 1.03 mg/dL (ref 0.76–1.27)
Glucose: 92 mg/dL (ref 70–99)
Potassium: 4.2 mmol/L (ref 3.5–5.2)
Sodium: 145 mmol/L — ABNORMAL HIGH (ref 134–144)
eGFR: 77 mL/min/{1.73_m2} (ref >59)

## 2022-02-19 ENCOUNTER — Encounter: Payer: Self-pay | Admitting: Thoracic Surgery (Cardiothoracic Vascular Surgery)

## 2022-02-19 ENCOUNTER — Ambulatory Visit (INDEPENDENT_AMBULATORY_CARE_PROVIDER_SITE_OTHER): Payer: Self-pay | Admitting: Thoracic Surgery (Cardiothoracic Vascular Surgery)

## 2022-02-19 ENCOUNTER — Ambulatory Visit
Admission: RE | Admit: 2022-02-19 | Discharge: 2022-02-19 | Disposition: A | Discharge status: Home / Self Care | Payer: Medicare Other | Source: Ambulatory Visit | Attending: Thoracic Surgery (Cardiothoracic Vascular Surgery) | Admitting: Thoracic Surgery (Cardiothoracic Vascular Surgery)

## 2022-02-19 ENCOUNTER — Other Ambulatory Visit: Payer: Self-pay | Admitting: *Deleted

## 2022-02-19 VITALS — BP 144/76 | HR 76 | Resp 20 | Ht 72.0 in | Wt 298.0 lb

## 2022-02-19 DIAGNOSIS — Z952 Presence of prosthetic heart valve: Secondary | ICD-10-CM

## 2022-02-19 DIAGNOSIS — Z951 Presence of aortocoronary bypass graft: Secondary | ICD-10-CM | POA: Diagnosis not present

## 2022-02-19 DIAGNOSIS — J90 Pleural effusion, not elsewhere classified: Secondary | ICD-10-CM | POA: Diagnosis not present

## 2022-02-19 DIAGNOSIS — I350 Nonrheumatic aortic (valve) stenosis: Secondary | ICD-10-CM

## 2022-02-19 DIAGNOSIS — R918 Other nonspecific abnormal finding of lung field: Secondary | ICD-10-CM | POA: Diagnosis not present

## 2022-02-19 MED ORDER — POTASSIUM CHLORIDE CRYS ER 10 MEQ PO TBCR: 20.0000 meq | EXTENDED_RELEASE_TABLET | Freq: Every day | ORAL | Status: AC

## 2022-02-19 MED ORDER — FUROSEMIDE 20 MG PO TABS: 20.0000 mg | ORAL_TABLET | Freq: Every day | ORAL | 11 refills | Status: DC

## 2022-02-19 NOTE — Progress Notes (Signed)
[address]       [place] [address]             [place] Record [id number] Date of Birth: 27-May-1950  Josue Hector, MD Reynold Bowen, MD  Chief Complaint:    Chief Complaint  Patient presents with   Routine Post Op    2 week f/u with CXR     History of Present Illness:     Pt now one month sp AVR/SVG CABG to RCA. Pt doing well. Historically not a good sleeper. Has had improvement in lower leg (r) swelling on lasix. Has some cough with phlegm in morning. Eating well.       Past Medical History:  Diagnosis Date   Aortic stenosis    Arthritis    ASHD (arteriosclerotic heart disease)    Chest pressure    COVID-19 virus infection 11/2019   x 3   GERD (gastroesophageal reflux disease)    Heart murmur    History of kidney stones    Hyperglycemia    Hyperlipidemia    Hypertension    Nephrolithiasis    Obesity     Past Surgical History:  Procedure Laterality Date   AORTIC VALVE REPLACEMENT N/A 01/23/2022   Procedure: AORTIC VALVE REPLACEMENT (AVR) USING 27MM INSPIRIS RESILIA  AORTIC VALVE;  Surgeon: Coralie Common, MD;  Location: [place];  Service: Open Heart Surgery;  Laterality: N/A;  Mid sternotomy   APPENDECTOMY     CIRCUMCISION N/A 11/03/2019   Procedure: CIRCUMCISION ADULT;  Surgeon: Irine Seal, MD;  Location: [facility];  Service: Urology;  Laterality: N/A;   CORONARY ARTERY BYPASS GRAFT N/A 01/23/2022   Procedure: CORONARY ARTERY BYPASS GRAFTING (CABGX1) USING ENDOSCOPICALLY HARVESTED RIGHT GREATER SAPHENOUS VEIN;  Surgeon: Coralie Common, MD;  Location: [place] [facility];  Service: Open Heart Surgery;  Laterality: N/A;   RIGHT/LEFT HEART CATH AND CORONARY ANGIOGRAPHY N/A 11/29/2021   Procedure: RIGHT/LEFT HEART CATH AND CORONARY ANGIOGRAPHY;  Surgeon: Sherren Mocha, MD;  Location: [facility];  Service: Cardiovascular;  Laterality: N/A;   TEE WITHOUT CARDIOVERSION N/A 01/23/2022   Procedure:  TRANSESOPHAGEAL ECHOCARDIOGRAM (TEE);  Surgeon: Coralie Common, MD;  Location: [facility];  Service: Open Heart Surgery;  Laterality: N/A;    Social History   Tobacco Use  Smoking Status Former   Years: 20.00   Types: Cigarettes, Cigars   Quit date: 08/19/1978   Years since quitting: 43.5  Smokeless Tobacco Former    Social History   Substance and Sexual Activity  Alcohol Use No    Social History   Socioeconomic History   Marital status: Significant Other    Spouse name: Not on file   Number of children: 1   Years of education: 12   Highest education level: Not on file  Occupational History   Not on file  Tobacco Use   Smoking status: Former    Years: 20.00    Types: Cigarettes, Cigars    Quit date: 08/19/1978    Years since quitting: 43.5   Smokeless tobacco: Former  Scientific laboratory technician Use: Never used  Substance and Sexual Activity   Alcohol use: No   Drug use: No   Sexual activity: Yes  Other Topics Concern   Not on file  Social History Narrative   Not on file   Social Determinants of Health   Financial Resource Strain:  Not on file  Food Insecurity: Not on file  Transportation Needs: Not on file  Physical Activity: Not on file  Stress: Not on file  Social Connections: Not on file  Intimate Partner Violence: Not on file    No Known Allergies  Current Outpatient Medications  Medication Sig Dispense Refill   acetaminophen (TYLENOL) 325 MG tablet Take 2 tablets (650 mg total) by mouth every 4 (four) hours as needed.     amiodarone (PACERONE) 200 MG tablet Take 1 tablet (200 mg total) by mouth 2 (two) times daily. For 14 days then reduce the dose to 1 tablet (200 mg) by mouth once daily. 60 tablet 1   aspirin EC 325 MG tablet Take 1 tablet (325 mg total) by mouth daily.     atorvastatin (LIPITOR) 80 MG tablet Take 1 tablet (80 mg total) by mouth daily. 30 tablet 2   furosemide (LASIX) 40 MG tablet Take 1 tablet (40 mg total) by mouth daily. 14 tablet 0    latanoprost (XALATAN) 0.005 % ophthalmic solution Place 1 drop into both eyes at bedtime.     metoprolol succinate (TOPROL-XL) 100 MG 24 hr tablet Take 1 tablet (100 mg total) by mouth daily. Take with or immediately following a meal. 90 tablet 1   potassium chloride SA (KLOR-CON M) 20 MEQ tablet Take 1 tablet (20 mEq total) by mouth daily. 14 tablet 0   No current facility-administered medications for this visit.     Family History  Problem Relation Age of Onset   Cancer Mother    Heart disease Father    Heart disease Brother    Cancer Brother        throat cancer       Physical Exam: Ht 6' (1.829 m)   BMI 40.28 kg/m  Lungs: decrease at bases Card: rr without murmur Chest incision well healed Ext: right leg with some edema. Left without    Diagnostic Studies & Laboratory data: I have personally reviewed the following studies and agree with the findings     Recent Radiology Findings:   DG Chest 2 View  Result Date: 02/19/2022 CLINICAL DATA:  History of aortic valve replacement EXAM: CHEST - 2 VIEW COMPARISON:  02/06/2022 FINDINGS: Stable heart size status post sternotomy with CABG and aortic valve replacement. Persistent small bilateral pleural effusions with associated bibasilar opacities. No pneumothorax. IMPRESSION: Persistent small bilateral pleural effusions with associated bibasilar opacities. Electronically Signed   By: Davina Poke D.O.   On: 02/19/2022 14:17      Recent Lab Findings: Lab Results  Component Value Date   WBC 10.3 01/26/2022   HGB 9.1 (L) 01/26/2022   HCT 26.1 (L) 01/26/2022   PLT 102 (L) 01/26/2022   GLUCOSE 92 02/14/2022   CHOL 144 08/12/2017   TRIG 117 08/12/2017   HDL 30 (L) 08/12/2017   LDLCALC 91 08/12/2017   ALT 24 01/19/2022   AST 18 01/19/2022   NA 145 (H) 02/14/2022   K 4.2 02/14/2022   CL 102 02/14/2022   CREATININE 1.03 02/14/2022   BUN 11 02/14/2022   CO2 25 02/14/2022   TSH 1.521 05/13/2014   INR 1.3 (H) 01/23/2022    HGBA1C 5.9 (H) 01/19/2022      Assessment / Plan:     SP AVR/CABG Has ongoing small pleural effusions/ right leg swelling we will continue lasix/K for another month and will be off when sees cardiology in January for assesment DC amiodarone Cardiac rehab Pt may drive  and refrain from lifting another two weeks. Restrictions reviewed Pt will call if breathing worsens to consider thorocentesis   I have spent 30 min in review of the records, viewing studies and in face to face with patient and in coordination of future care    Eugenio Hoes 02/19/2022 2:23 PM

## 2022-02-19 NOTE — Patient Instructions (Signed)
Continue Lasix/potassium for another month Stop amiodarone Follow up prn Call if breathing worsens Continue follow up with cardiology

## 2022-02-19 NOTE — Progress Notes (Signed)
Amb referral for Cardiac Rehab placed per Dr. Leafy Ro.

## 2022-02-20 DIAGNOSIS — I25709 Atherosclerosis of coronary artery bypass graft(s), unspecified, with unspecified angina pectoris: Secondary | ICD-10-CM | POA: Diagnosis not present

## 2022-02-20 DIAGNOSIS — N281 Cyst of kidney, acquired: Secondary | ICD-10-CM | POA: Diagnosis not present

## 2022-02-20 DIAGNOSIS — I700 Atherosclerosis of aorta: Secondary | ICD-10-CM | POA: Diagnosis not present

## 2022-02-20 DIAGNOSIS — I10 Essential (primary) hypertension: Secondary | ICD-10-CM | POA: Diagnosis not present

## 2022-02-21 ENCOUNTER — Encounter: Payer: Self-pay | Admitting: Thoracic Surgery (Cardiothoracic Vascular Surgery)

## 2022-02-21 ENCOUNTER — Other Ambulatory Visit: Payer: Self-pay | Admitting: *Deleted

## 2022-02-21 ENCOUNTER — Other Ambulatory Visit: Payer: Self-pay | Admitting: Surgical

## 2022-02-21 MED ORDER — POTASSIUM CHLORIDE CRYS ER 20 MEQ PO TBCR: 20.0000 meq | EXTENDED_RELEASE_TABLET | Freq: Every day | ORAL | 0 refills | Status: DC

## 2022-02-21 NOTE — Progress Notes (Signed)
Potassium rx ordered as clinic administered medication. Prescription reordered and sent to patient's pharmacy.

## 2022-03-07 ENCOUNTER — Ambulatory Visit (HOSPITAL_COMMUNITY): Payer: Medicare Other | Attending: Cardiovascular Disease

## 2022-03-07 DIAGNOSIS — I350 Nonrheumatic aortic (valve) stenosis: Secondary | ICD-10-CM | POA: Insufficient documentation

## 2022-03-07 DIAGNOSIS — E669 Obesity, unspecified: Secondary | ICD-10-CM | POA: Diagnosis not present

## 2022-03-07 DIAGNOSIS — Z952 Presence of prosthetic heart valve: Secondary | ICD-10-CM

## 2022-03-07 DIAGNOSIS — I10 Essential (primary) hypertension: Secondary | ICD-10-CM | POA: Diagnosis not present

## 2022-03-07 DIAGNOSIS — E785 Hyperlipidemia, unspecified: Secondary | ICD-10-CM | POA: Diagnosis not present

## 2022-03-07 DIAGNOSIS — R739 Hyperglycemia, unspecified: Secondary | ICD-10-CM | POA: Diagnosis not present

## 2022-03-07 LAB — ECHOCARDIOGRAM COMPLETE
AR max vel: 3.58 cm2
AV Area VTI: 3.79 cm2
AV Area mean vel: 3.26 cm2
AV Mean grad: 7 mmHg
AV Peak grad: 14 mmHg
Ao pk vel: 1.87 m/s
Area-P 1/2: 2.94 cm2
S' Lateral: 3.6 cm

## 2022-03-07 MED ORDER — PERFLUTREN LIPID MICROSPHERE
1.0000 mL | INTRAVENOUS | Status: AC | PRN
  Administered 2022-03-07: 2 mL via INTRAVENOUS

## 2022-03-09 ENCOUNTER — Telehealth: Payer: Self-pay | Admitting: Cardiovascular Disease

## 2022-03-09 NOTE — Telephone Encounter (Signed)
Pt returning call for echo results  

## 2022-03-12 NOTE — Telephone Encounter (Signed)
Per Dr. Eden Emms, EF normal AVR looks good. Patient verbalized understanding

## 2022-03-19 NOTE — Progress Notes (Unsigned)
Office Visit    Patient Name: Malik [name] Date of Encounter: 03/21/2022  Primary Care Provider:  Adrian Prince, MD Primary Cardiologist:  [name], MD Primary Electrophysiologist: None  Chief Complaint    Malik [name] is a 71 y.o. male with PMH of CAD s/p CABG x 1 and AVR with bioprosthetic valve, HTN, HLD, pulmonary nodule, frequent PVCs, GERD who presents today for posthospital follow-up.   Past Medical History    Past Medical History:  Diagnosis Date   Aortic stenosis    Arthritis    ASHD (arteriosclerotic heart disease)    Chest pressure    COVID-19 virus infection 11/2019   x 3   GERD (gastroesophageal reflux disease)    Heart murmur    History of kidney stones    Hyperglycemia    Hyperlipidemia    Hypertension    Nephrolithiasis    Obesity    Past Surgical History:  Procedure Laterality Date   AORTIC VALVE REPLACEMENT N/A 01/23/2022   Procedure: AORTIC VALVE REPLACEMENT (AVR) USING INSPIRIS RESILIA  AORTIC VALVE;  Surgeon: Eugenio Hoes, MD;  [facility];  Service: Open Heart Surgery;  Laterality: N/A;  Mid sternotomy   APPENDECTOMY     CIRCUMCISION N/A 11/03/2019   Procedure: CIRCUMCISION ADULT;  Surgeon: Bjorn Pippin, MD;  [facility];  Service: Urology;  Laterality: N/A;   CORONARY ARTERY BYPASS GRAFT N/A 01/23/2022   Procedure: CORONARY ARTERY BYPASS GRAFTING (CABGX1) USING ENDOSCOPICALLY HARVESTED RIGHT GREATER SAPHENOUS VEIN;  Surgeon: Eugenio Hoes, MD;  Location[facility];  Service: Open Heart Surgery;  Laterality: N/A;   RIGHT/LEFT HEART CATH AND CORONARY ANGIOGRAPHY N/A 11/29/2021   Procedure: RIGHT/LEFT HEART CATH AND CORONARY ANGIOGRAPHY;  Surgeon: Tonny Bollman, MD;  [facility]: [facility];  Service: Cardiovascular;  Laterality: N/A;   TEE WITHOUT CARDIOVERSION N/A 01/23/2022   Procedure: TRANSESOPHAGEAL ECHOCARDIOGRAM (TEE);  Surgeon: Eugenio Hoes, MD;  Location: [facility];  Service: Open Heart Surgery;  Laterality: N/A;     Allergies  No Known Allergies  History of Present Illness    Malik [name]  is a 71 year old male with the above mention past medical history who presents today for follow-up of recent CABG and AVR.  Malik [name] was initially seen by Dr.Nishan on 10/2012 for management of murmur.  He had complaint of exertional dyspnea and underwent ETT that was normal.  Coronary calcium score was obtained that showed 66 percentile score of 114 with 5 mm left upper lobe lung nodule.  He was then seen in 2016 for evaluation of aortic stenosis.  2D echo was completed 9/14 that revealed EF of 55-60% with mild LVH and grade 2 DD with moderate AVR and mildly dilated LA.  He developed exertional angina over the past year and presented on 11/08/2021 for structural workup.  He underwent R/LHC on 9/6 that revealed a high grade 80% stenosis of the ostium of the RCA and also 70% of a distal AV groove OM. He underwent TAVR CTA which has adequate anatomy for TAVR.  Decision was made for long-term management to undergo CABG with AVR.  He underwent successful procedure on 10/31 with 27 mm bioprosthetic valve with saphenous vein graft to the RCA.  Patient did well postprocedure and developed PVCs with subsequent amiodarone initiated with diuretics for volume overload.  He was eventually transition to beta-blocker therapy and was discharged in stable condition 01/28/2022.  He was seen by Gershon Crane, PA in follow-up on  02/06/2022.  During visit patient reported difficulty with sleeping and has discomfort with sternotomy.  He also had some mild shortness of breath with bilateral lower extremity edema.  Patient was noted to sleep in a recliner due to shortness of breath.  He was started on a short course of Lasix with potassium for 2 weeks.   Malik [name] presents today for post bypass and valve replacement visit alone.  Since last being seen in the office patient reports that his swelling has improved significantly since starting Lasix.   He is down 6 pounds today compared to his visit on 11/14.  He does still have some pitting edema on the left lower extremity and right lower extremity is edematous due to vein harvesting.  He reports some chest pain from his sternotomy incision but denies any cardiac pain.  Patient had 2D echo completed showing EF of 60-65%, with moderately dilated RA and aortic mean gradient of 7.0 with mild dilation of the ascending aorta of 41 mm.  He was seen in follow-up by Dr. Leafy Ro with amiodarone discontinued and driving privileges granted.  Malik [name] presents today for 1 month follow-up alone.  Since last being seen in the office patient reports that he has been doing well with no new cardiac complaints at this time.  He reports compliance with his medication regimen and denies any adverse reaction.  His blood pressures today have been elevated at 140/80 and 148/84 on recheck.  He is euvolemic on exam with trace lower extremity edema bilaterally.  He reports today that his breathing has been well and denies any sternotomy pain.  During our visit today we discussed his interest in cardiac rehab and he politely declined at this time.  We also reviewed the results of his recent 2D echo and all questions were answered to his satisfaction..  Patient denies chest pain, palpitations, dyspnea, PND, orthopnea, nausea, vomiting, dizziness, syncope, edema, weight gain, or early satiety.   Home Medications    Current Outpatient Medications  Medication Sig Dispense Refill   acetaminophen (TYLENOL) 325 MG tablet Take 2 tablets (650 mg total) by mouth every 4 (four) hours as needed.     aspirin EC 325 MG tablet Take 1 tablet (325 mg total) by mouth daily.     atorvastatin (LIPITOR) 80 MG tablet Take 1 tablet (80 mg total) by mouth daily. 30 tablet 2   furosemide (LASIX) 20 MG tablet Take 1 tablet (20 mg total) by mouth daily. 30 tablet 11   latanoprost (XALATAN) 0.005 % ophthalmic solution Place 1 drop into both eyes at  bedtime.     metoprolol succinate (TOPROL-XL) 100 MG 24 hr tablet Take 1 tablet (100 mg total) by mouth daily. Take with or immediately following a meal. 90 tablet 1   potassium chloride SA (KLOR-CON M) 20 MEQ tablet Take 1 tablet (20 mEq total) by mouth daily. 30 tablet 0   No current facility-administered medications for this visit.     Review of Systems  Please see the history of present illness.    (+) Chronic left knee pain (+) Trace lower extremity edema bilaterally  All other systems reviewed and are otherwise negative except as noted above.  Physical Exam    Wt Readings from Last 3 Encounters:  03/21/22 299 lb (135.6 kg)  02/19/22 298 lb (135.2 kg)  02/14/22 297 lb (134.7 kg)   VS: Vitals:   03/21/22 1116  BP: (!) 140/80  Pulse: 93  SpO2: 93%  ,Body mass index  is 40.55 kg/m.  Constitutional:      Appearance: Healthy appearance. Not in distress.  Neck:     Vascular: JVD normal.  Pulmonary:     Effort: Pulmonary effort is normal.     Breath sounds: No wheezing. No rales. Diminished in the bases Cardiovascular:     Normal rate. Regular rhythm. Normal S1. Normal S2.      Murmurs: There is no murmur.  Edema:    Peripheral edema absent.  Abdominal:     Palpations: Abdomen is soft non tender. There is no hepatomegaly.  Skin:    General: Skin is warm and dry.  Neurological:     General: No focal deficit present.     Mental Status: Alert and oriented to person, place and time.     Cranial Nerves: Cranial nerves are intact.  EKG/LABS/Other Studies Reviewed    ECG personally reviewed by me today -none completed today    Lab Results  Component Value Date   WBC 10.3 01/26/2022   HGB 9.1 (L) 01/26/2022   HCT 26.1 (L) 01/26/2022   MCV 91.3 01/26/2022   PLT 102 (L) 01/26/2022   Lab Results  Component Value Date   CREATININE 1.03 02/14/2022   BUN 11 02/14/2022   NA 145 (H) 02/14/2022   K 4.2 02/14/2022   CL 102 02/14/2022   CO2 25 02/14/2022   Lab  Results  Component Value Date   ALT 24 01/19/2022   AST 18 01/19/2022   ALKPHOS 53 01/19/2022   BILITOT 0.9 01/19/2022   Lab Results  Component Value Date   CHOL 144 08/12/2017   HDL 30 (L) 08/12/2017   LDLCALC 91 08/12/2017   TRIG 117 08/12/2017   CHOLHDL 4.8 08/12/2017    Lab Results  Component Value Date   HGBA1C 5.9 (H) 01/19/2022    Assessment & Plan    1.  Coronary artery disease: -s/p CABG x 1 for treatment of single-vessel severe ostial stenosis with SVG to RCA -Today patient reports no anginal chest pain and shortness of breath has resolved since previous visit. -Continue current GDMT with ASA 81 mg, Lipitor 80 mg, and Toprol-XL 100 mg daily  2.  Severe aortic stenosis: -Severe calcific aortic stenosis with mean transvalvular gradient 50 mmHg, peak to peak  -s/p AVR repair with 27 mm bioprosthetic -Today patient reports no shortness of breath or anginal discomfort -2D echo completed recently with aortic mean gradient of 7.0 -SBE prophylaxis discussed   3.  Essential hypertension: -Patient's blood pressure today was slightly elevated at 140/80 -We will have him document blood pressures over the next 2 weeks and may change metoprolol 100 mg to carvedilol if BP remains elevated.   4.  Hyperlipidemia: -Patient's LDL cholesterol was 75 in 1/61097/2023 -Continue atorvastatin 80 mg   5.  Morbid obesity: -Patient's BMI is 42.07 kg/m  -Patient was advised to increase physical activity to at least 150 minutes/weeks of moderate intensity exercise   6.  Postoperative atrial fibrillation: -Currently resolved and no longer on amiodarone 200 mg    Cardiac Rehabilitation Eligibility Assessment  The patient has declined or is not appropriate for cardiac rehabilitation. (Pateint would like to think about proceeding before commiting to program)     Disposition: Follow-up with Charlton HawsPeter Nishan, MD or APP in 6 months    Medication Adjustments/Labs and Tests Ordered: Current  medicines are reviewed at length with the patient today.  Concerns regarding medicines are outlined above.   Signed, Napoleon Formick Jr, Leodis RainsErnest Henry,  NP 03/21/2022, 11:46 AM [facility]  Note:  This document was prepared using Dragon voice recognition software and may include unintentional dictation errors.

## 2022-03-21 ENCOUNTER — Encounter: Payer: Self-pay | Admitting: Nurse Practitioner

## 2022-03-21 ENCOUNTER — Ambulatory Visit: Payer: Medicare Other | Attending: Nurse Practitioner | Admitting: Nurse Practitioner

## 2022-03-21 VITALS — BP 140/80 | HR 93 | Ht 72.0 in | Wt 299.0 lb

## 2022-03-21 DIAGNOSIS — I9789 Other postprocedural complications and disorders of the circulatory system, not elsewhere classified: Secondary | ICD-10-CM | POA: Diagnosis not present

## 2022-03-21 DIAGNOSIS — Z6841 Body Mass Index (BMI) 40.0 and over, adult: Secondary | ICD-10-CM

## 2022-03-21 DIAGNOSIS — E785 Hyperlipidemia, unspecified: Secondary | ICD-10-CM | POA: Diagnosis not present

## 2022-03-21 DIAGNOSIS — I10 Essential (primary) hypertension: Secondary | ICD-10-CM

## 2022-03-21 DIAGNOSIS — I2510 Atherosclerotic heart disease of native coronary artery without angina pectoris: Secondary | ICD-10-CM | POA: Diagnosis not present

## 2022-03-21 DIAGNOSIS — I4891 Unspecified atrial fibrillation: Secondary | ICD-10-CM

## 2022-03-21 NOTE — Patient Instructions (Signed)
Medication Instructions:  Your physician recommends that you continue on your current medications as directed. Please refer to the Current Medication list given to you today.  *If you need a refill on your cardiac medications before your next appointment, please call your pharmacy*  Follow-Up: At [facility], you and your health needs are our priority.  As part of our continuing mission to provide you with exceptional heart care, we have created designated Provider Care Teams.  These Care Teams include your primary Cardiologist (physician) and Advanced Practice Providers (APPs -  Physician Assistants and Nurse Practitioners) who all work together to provide you with the care you need, when you need it.  Your next appointment:   6 month(s)  The format for your next appointment:   In Person  Provider:   [name], MD    Other Instructions Please check your blood pressure at home over the next 2 weeks and send in a list of your readings via MyChart message or call them in at [phone].  HOW TO TAKE YOUR BLOOD PRESSURE Rest 5 minutes before taking your blood pressure. Don't  smoke or drink caffeinated beverages for at least 30 minutes before. Take your blood pressure before (not after) you eat. Sit comfortably with your back supported and both feet on the floor ( don't cross your legs). Elevate your arm to heart level on a table or a desk. Use the proper sized cuff.  It should fit smoothly and snugly around your bare upper arm. There should be enough room to slip a fingertip under the cuff.  The bottom edge of the cuff should be 1 inch above the crease of the elbow.  Please monitor your blood pressure once daily 2 hours after your am medication. If you blood pressure Consistently remains above 140 (systolic) top number or over 90 ( diastolic) bottom number X 3 days  Consecutively.  Please call our office at [phone] or send Mychart message.     Important Information  About Sugar

## 2022-03-22 ENCOUNTER — Encounter: Payer: Self-pay | Admitting: Cardiovascular Disease

## 2022-03-23 MED ORDER — AMOXICILLIN 500 MG PO TABS: ORAL_TABLET | ORAL | 2 refills | Status: AC

## 2022-03-29 ENCOUNTER — Encounter (HOSPITAL_COMMUNITY): Payer: Self-pay

## 2022-04-10 ENCOUNTER — Other Ambulatory Visit: Payer: Self-pay

## 2022-04-10 MED ORDER — CARVEDILOL 12.5 MG PO TABS: 12.5000 mg | ORAL_TABLET | Freq: Two times a day (BID) | ORAL | 2 refills | Status: DC

## 2022-04-18 ENCOUNTER — Telehealth (HOSPITAL_COMMUNITY): Payer: Self-pay

## 2022-04-18 NOTE — Telephone Encounter (Signed)
No response from pt.  Closed referral  

## 2022-04-25 ENCOUNTER — Other Ambulatory Visit: Payer: Self-pay | Admitting: Nurse Practitioner

## 2022-04-25 ENCOUNTER — Other Ambulatory Visit: Payer: Self-pay

## 2022-04-25 MED ORDER — CARVEDILOL 25 MG PO TABS: 25.0000 mg | ORAL_TABLET | Freq: Two times a day (BID) | ORAL | 1 refills | Status: DC

## 2022-04-26 ENCOUNTER — Other Ambulatory Visit: Payer: Self-pay

## 2022-04-26 ENCOUNTER — Other Ambulatory Visit: Payer: Self-pay | Admitting: Cardiovascular Disease

## 2022-04-26 MED ORDER — METOPROLOL SUCCINATE ER 25 MG PO TB24: 12.5000 mg | ORAL_TABLET | Freq: Every day | ORAL | 1 refills | Status: DC

## 2022-04-26 MED ORDER — VALSARTAN 80 MG PO TABS: 80.0000 mg | ORAL_TABLET | Freq: Every day | ORAL | 1 refills | Status: DC

## 2022-04-27 ENCOUNTER — Other Ambulatory Visit: Payer: Self-pay | Admitting: Physician Assistant

## 2022-04-30 ENCOUNTER — Other Ambulatory Visit: Payer: Self-pay

## 2022-04-30 MED ORDER — ATORVASTATIN CALCIUM 80 MG PO TABS: 80.0000 mg | ORAL_TABLET | Freq: Every day | ORAL | 1 refills | Status: DC

## 2022-04-30 NOTE — Telephone Encounter (Signed)
Rx(s) sent to pharmacy electronically.  

## 2022-05-04 MED ORDER — LEVOTHYROXINE SODIUM 50 MCG PO TABS
50 MCG | ORAL_TABLET | ORAL | 0 refills | Status: AC
Start: 2022-05-04 — End: 2022-05-31

## 2022-05-04 NOTE — Telephone Encounter (Signed)
Left voice messagi for pt to get labs done and to call office with any questions

## 2022-05-04 NOTE — Telephone Encounter (Signed)
LV 07/26/2021

## 2022-05-07 NOTE — Progress Notes (Unsigned)
Cardiology Office Note:    Date:  05/08/2022   ID:  Malik [name], Malik [name] 05/07/1950, MRN [id number]  PCP:  Reynold Bowen, [place] [facility] Cardiologist:  Jenkins Rouge, MD    Referring MD: Reynold Bowen, MD   Patient Profile: Coronary artery disease  Aortic stenosis LHC 11/29/21: oRCA 80, pLAD 55, pLCx 21 S/p CABG x 1 (S-RCA) + bioprosthetic AVR 12/2021 TTE 03/07/22: EF 60-65, NL RVSF, mild to mod RAE, s/p AVR w mean 7 mmHg, Asc Aorta 41 mm, RAP 3 Hypertension  Hyperlipidemia  Frequent PVCs Post op >> Amiodarone Rx (DCd in Dec 2023)     History of Present Illness:   Malik [name] is a 72 y.o. male with the above problem list.  He was last seen in clinic for f/u by Ambrose Pancoast, NP in Dec 2023. He has contacted our office since with uncontrolled BPs. He returns for evaluation of hypertension. He was previously on Lisinopril/HCTZ 20/25 mg once daily prior to his surgery. He was DC'd on Toprol and Lasix. He was switched from Toprol XL to Coreg several weeks ago. He was having HAs and was switched back to Toprol XL 25 mg and started on Valsartan 80 mg. BP readings provided on MyChart have ranged up to 198/100. Most recently his BP was 156/83. He feels his HAs are better since starting on Valsartan. He does eat out a few times a week. He eats breakfast at [employer] some days.    Subjective    Reviewed and updated this encounter:  Tobacco  Allergies  Meds  Problems  Med Hx  Surg Hx  Fam Hx     Review of Systems  Constitutional: Negative for fever and malaise/fatigue.  Musculoskeletal:  Negative for myalgias.    Objective   Labs/Other Test Reviewed:   Recent Labs: 01/19/2022: ALT 24 01/26/2022: Hemoglobin 9.1; Platelets 102 02/14/2022: BUN 11; Creatinine, Ser 1.03; Magnesium 2.2; Potassium 4.2; Sodium 145   Recent Lipid Panel No results for input(s): "CHOL", "TRIG", "HDL", "VLDL", "LDLCALC", "LDLDIRECT" in the last 8760 hours.   Risk  Assessment/Calculations/Metrics:         HYPERTENSION CONTROL Vitals:   05/08/22 0846 05/08/22 0926  BP: (!) 178/96 (!) 160/110    The patient's blood pressure is elevated above target today.  In order to address the patient's elevated BP: A new medication was prescribed today.      Physical Exam:   VS:  BP (!) 160/110   Pulse 70   Ht 6' (1.829 m)   Wt 294 lb 9.6 oz (133.6 kg)   SpO2 95%   BMI 39.95 kg/m    Wt Readings from Last 3 Encounters:  05/08/22 294 lb 9.6 oz (133.6 kg)  03/21/22 299 lb (135.6 kg)  02/19/22 298 lb (135.2 kg)    Constitutional:      Appearance: Healthy appearance. Not in distress.  HENT:     Head:     Comments: No temporal artery tenderness bilaterally  Neck:     Vascular: JVD normal.  Pulmonary:     Breath sounds: Normal breath sounds. No wheezing. No rales.  Cardiovascular:     Normal rate. Regular rhythm.     Murmurs: There is no murmur.  Edema:    Peripheral edema absent.  Abdominal:     Palpations: Abdomen is soft.     Assessment & Plan     ASSESSMENT & PLAN:   Hypertension BP uncontrolled. He has not had any medications  yet today. We discussed the appropriate way to monitor BP at home. We also discussed the importance of a low salt diet, weight loss and exercise. It sounds like he eats a diet with a lot of sodium. He used to be on HCTZ, but this was stopped post op.  BMET today. Continue Toprol XL 25 mg, Valsartan 80 mg Start HCTZ 25 mg once daily Low Na diet BMET 1 week F/u with me or Jaquelyn Bitter in 3-4 weeks If BP optimal, consider combining Valsartan and HCTZ in combo tablet  Severe aortic stenosis 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 Continue SBE prophylaxis.   Coronary artery disease involving native coronary artery of native heart without angina pectoris S/p CABG x 1. No angina. Continue ASA, Atorvastatin 80 mg.  Hyperlipidemia Goal LDL < 70. Continue Atorvastatin 80 mg once daily. Obtain fasting  Lipids, LFTs in 1 week.              Dispo:  Return in about 4 weeks (around 06/05/2022) for Routine Follow Up, w/ Richardson Dopp, PA-C or Ambrose Pancoast, NP.   Signed, Richardson Dopp, PA-C  05/08/2022 9:27 AM    [facility] [address], [place], [place]  [address] Phone: [phone]; Fax: [phone]

## 2022-05-08 ENCOUNTER — Ambulatory Visit: Payer: Medicare Other | Attending: Physician Assistant | Admitting: Physician Assistant

## 2022-05-08 ENCOUNTER — Encounter: Payer: Self-pay | Admitting: Physician Assistant

## 2022-05-08 VITALS — BP 160/110 | HR 70 | Ht 72.0 in | Wt 294.6 lb

## 2022-05-08 DIAGNOSIS — I350 Nonrheumatic aortic (valve) stenosis: Secondary | ICD-10-CM | POA: Diagnosis not present

## 2022-05-08 DIAGNOSIS — I2510 Atherosclerotic heart disease of native coronary artery without angina pectoris: Secondary | ICD-10-CM | POA: Diagnosis not present

## 2022-05-08 DIAGNOSIS — E7800 Pure hypercholesterolemia, unspecified: Secondary | ICD-10-CM | POA: Diagnosis not present

## 2022-05-08 DIAGNOSIS — I10 Essential (primary) hypertension: Secondary | ICD-10-CM | POA: Diagnosis not present

## 2022-05-08 LAB — BASIC METABOLIC PANEL
BUN/Creatinine Ratio: 11 (ref 10–24)
BUN: 10 mg/dL (ref 8–27)
CO2: 24 mmol/L (ref 20–29)
Calcium: 9.7 mg/dL (ref 8.6–10.2)
Chloride: 102 mmol/L (ref 96–106)
Creatinine, Ser: 0.89 mg/dL (ref 0.76–1.27)
Glucose: 104 mg/dL — ABNORMAL HIGH (ref 70–99)
Potassium: 4.3 mmol/L (ref 3.5–5.2)
Sodium: 140 mmol/L (ref 134–144)
eGFR: 91 mL/min/{1.73_m2} (ref >59)

## 2022-05-08 MED ORDER — HYDROCHLOROTHIAZIDE 25 MG PO TABS: 25.0000 mg | ORAL_TABLET | Freq: Every day | ORAL | 3 refills | Status: DC

## 2022-05-08 NOTE — Assessment & Plan Note (Signed)
Goal LDL < 70. Continue Atorvastatin 80 mg once daily. Obtain fasting Lipids, LFTs in 1 week.

## 2022-05-08 NOTE — Assessment & Plan Note (Signed)
S/p CABG x 1. No angina. Continue ASA, Atorvastatin 80 mg.

## 2022-05-08 NOTE — Patient Instructions (Signed)
Medication Instructions:  Your physician has recommended you make the following change in your medication:   START Hydrochlorothiazide 25 mg taking 1 daily   *If you need a refill on your cardiac medications before your next appointment, please call your pharmacy*   Lab Work: TODAY:  BMET  05/15/22:  BMET anytime after 7:15 and before 4:45.  If you have labs (blood work) drawn today and your tests are completely normal, you will receive your results only by: [place] (if you have MyChart) OR A paper copy in the mail If you have any lab test that is abnormal or we need to change your treatment, we will call you to review the results.   Testing/Procedures: None ordered   Follow-Up: At [facility], you and your health needs are our priority.  As part of our continuing mission to provide you with exceptional heart care, we have created designated Provider Care Teams.  These Care Teams include your primary Cardiologist (physician) and Advanced Practice Providers (APPs -  Physician Assistants and Nurse Practitioners) who all work together to provide you with the care you need, when you need it.  We recommend signing up for the patient portal called "MyChart".  Sign up information is provided on this After Visit Summary.  MyChart is used to connect with patients for Virtual Visits (Telemedicine).  Patients are able to view lab/test results, encounter notes, upcoming appointments, etc.  Non-urgent messages can be sent to your provider as well.   To learn more about what you can do with MyChart, go to [email or web address].    Your next appointment:   2-3  week(s)  Provider:   Ambrose Pancoast, NP         Other Instructions

## 2022-05-08 NOTE — Assessment & Plan Note (Signed)
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 Continue SBE prophylaxis.

## 2022-05-08 NOTE — Assessment & Plan Note (Signed)
BP uncontrolled. He has not had any medications yet today. We discussed the appropriate way to monitor BP at home. We also discussed the importance of a low salt diet, weight loss and exercise. It sounds like he eats a diet with a lot of sodium. He used to be on HCTZ, but this was stopped post op.  BMET today. Continue Toprol XL 25 mg, Valsartan 80 mg Start HCTZ 25 mg once daily Low Na diet BMET 1 week F/u with me or Jaquelyn Bitter in 3-4 weeks If BP optimal, consider combining Valsartan and HCTZ in combo tablet

## 2022-05-15 ENCOUNTER — Ambulatory Visit: Payer: Medicare Other | Attending: Physician Assistant

## 2022-05-15 DIAGNOSIS — E7800 Pure hypercholesterolemia, unspecified: Secondary | ICD-10-CM

## 2022-05-15 DIAGNOSIS — I10 Essential (primary) hypertension: Secondary | ICD-10-CM

## 2022-05-15 LAB — BASIC METABOLIC PANEL
BUN/Creatinine Ratio: 13 (ref 10–24)
BUN: 13 mg/dL (ref 8–27)
CO2: 27 mmol/L (ref 20–29)
Calcium: 10 mg/dL (ref 8.6–10.2)
Chloride: 100 mmol/L (ref 96–106)
Creatinine, Ser: 0.99 mg/dL (ref 0.76–1.27)
Glucose: 119 mg/dL — ABNORMAL HIGH (ref 70–99)
Potassium: 4.2 mmol/L (ref 3.5–5.2)
Sodium: 140 mmol/L (ref 134–144)
eGFR: 81 mL/min/{1.73_m2} (ref >59)

## 2022-05-15 LAB — HEPATIC FUNCTION PANEL
ALT: 21 IU/L (ref 0–44)
AST: 15 IU/L (ref 0–40)
Albumin: 4.3 g/dL (ref 3.8–4.8)
Alkaline Phosphatase: 91 IU/L (ref 44–121)
Bilirubin Total: 0.5 mg/dL (ref 0.0–1.2)
Bilirubin, Direct: 0.16 mg/dL (ref 0.00–0.40)
Total Protein: 7.3 g/dL (ref 6.0–8.5)

## 2022-05-15 LAB — LIPID PANEL
Chol/HDL Ratio: 2.9 ratio (ref 0.0–5.0)
Cholesterol, Total: 106 mg/dL (ref 100–199)
HDL: 36 mg/dL — ABNORMAL LOW (ref >39)
LDL Chol Calc (NIH): 55 mg/dL (ref 0–99)
Triglycerides: 70 mg/dL (ref 0–149)
VLDL Cholesterol Cal: 15 mg/dL (ref 5–40)

## 2022-05-27 NOTE — Progress Notes (Unsigned)
Office Visit    Patient Name: Malik [name] Date of Encounter: 05/27/2022  Primary Care Provider:  Reynold Bowen, MD Primary Cardiologist:  Jenkins Rouge, MD Primary Electrophysiologist: None  Chief Complaint    Malik [name] is a 72 y.o. male with PMH of CAD s/p CABG x 1 and AVR with bioprosthetic valve, HTN, HLD, pulmonary nodule, frequent PVCs, GERD who presents today for 2-week follow-up of hypertension.  Past Medical History    Past Medical History:  Diagnosis Date   Aortic stenosis    Arthritis    ASHD (arteriosclerotic heart disease)    Chest pressure    COVID-19 virus infection 11/2019   x 3   GERD (gastroesophageal reflux disease)    Heart murmur    History of kidney stones    Hyperglycemia    Hyperlipidemia    Hypertension    Nephrolithiasis    Obesity    Past Surgical History:  Procedure Laterality Date   AORTIC VALVE REPLACEMENT N/A 01/23/2022   Procedure: AORTIC VALVE REPLACEMENT (AVR) USING 27MM INSPIRIS RESILIA  AORTIC VALVE;  Surgeon: Coralie Common, MD;  Location: [facility];  Service: Open Heart Surgery;  Laterality: N/A;  Mid sternotomy   APPENDECTOMY     CIRCUMCISION N/A 11/03/2019   Procedure: CIRCUMCISION ADULT;  Surgeon: Irine Seal, MD;  Location: [facility];  Service: Urology;  Laterality: N/A;   CORONARY ARTERY BYPASS GRAFT N/A 01/23/2022   Procedure: CORONARY ARTERY BYPASS GRAFTING (CABGX1) USING ENDOSCOPICALLY HARVESTED RIGHT GREATER SAPHENOUS VEIN;  Surgeon: Coralie Common, MD;  Location: [facility];  Service: Open Heart Surgery;  Laterality: N/A;   RIGHT/LEFT HEART CATH AND CORONARY ANGIOGRAPHY N/A 11/29/2021   Procedure: RIGHT/LEFT HEART CATH AND CORONARY ANGIOGRAPHY;  Surgeon: Sherren Mocha, MD;  Location: [facility];  Service: Cardiovascular;  Laterality: N/A;   TEE WITHOUT CARDIOVERSION N/A 01/23/2022   Procedure: TRANSESOPHAGEAL ECHOCARDIOGRAM (TEE);  Surgeon: Coralie Common, MD;  Location: [facility];  Service: Open Heart Surgery;   Laterality: N/A;    Allergies  No Known Allergies  History of Present Illness    Malik [name]  is a 72 year old male with the above mention past medical history who presents today for follow-up of hypertension and coronary artery disease.  Malik [name] has previously reported uncontrolled BPs with headaches and experienced elevated blood pressures at his initial follow-up with me on his previous follow-up on 03/21/2022.  During visit his Toprol was switched to carvedilol however patient had headaches and was switched back to Toprol.  He was also started on valsartan 80 mg and reported improvement to headaches but blood pressures are still elevated.  He was seen by Malik Dopp, PA on 05/08/2022 with BPs still above goal.  He endorsed eating [employer] and takeout foods a few days per week.  He was started on HCTZ and continued on losartan and Toprol.  Since last being seen in the office patient reports***.  Patient denies chest pain, palpitations, dyspnea, PND, orthopnea, nausea, vomiting, dizziness, syncope, edema, weight gain, or early satiety.     ***Notes:  Home Medications    Current Outpatient Medications  Medication Sig Dispense Refill   acetaminophen (TYLENOL) 325 MG tablet Take 2 tablets (650 mg total) by mouth every 4 (four) hours as needed.     amoxicillin (AMOXIL) 500 MG tablet Take 4 tablets by mouth one hour prior to dental procedures. 4 tablet 2   aspirin EC 325 MG tablet Take 1 tablet (325 mg total)  by mouth daily.     atorvastatin (LIPITOR) 80 MG tablet Take 1 tablet (80 mg total) by mouth daily. 90 tablet 1   furosemide (LASIX) 20 MG tablet Take 1 tablet (20 mg total) by mouth daily. (Patient not taking: Reported on 05/08/2022) 30 tablet 11   hydrochlorothiazide (HYDRODIURIL) 25 MG tablet Take 1 tablet (25 mg total) by mouth daily. 90 tablet 3   latanoprost (XALATAN) 0.005 % ophthalmic solution Place 1 drop into both eyes at bedtime.     metoprolol succinate  (TOPROL-XL) 25 MG 24 hr tablet Take 1 tablet (25 mg total) by mouth daily. 45 tablet 11   potassium chloride SA (KLOR-CON M) 20 MEQ tablet Take 1 tablet (20 mEq total) by mouth daily. (Patient not taking: Reported on 05/08/2022) 30 tablet 0   valsartan (DIOVAN) 80 MG tablet Take 1 tablet (80 mg total) by mouth daily. 90 tablet 3   No current facility-administered medications for this visit.     Review of Systems  Please see the history of present illness.    (+)*** (+)***  All other systems reviewed and are otherwise negative except as noted above.  Physical Exam    Wt Readings from Last 3 Encounters:  05/08/22 294 lb 9.6 oz (133.6 kg)  03/21/22 299 lb (135.6 kg)  02/19/22 298 lb (135.2 kg)   [id number] were no vitals filed for this visit.,There is no height or weight on file to calculate BMI.  Constitutional:      Appearance: Healthy appearance. Not in distress.  Neck:     Vascular: JVD normal.  Pulmonary:     Effort: Pulmonary effort is normal.     Breath sounds: No wheezing. No rales. Diminished in the bases Cardiovascular:     Normal rate. Regular rhythm. Normal S1. Normal S2.      Murmurs: There is no murmur.  Edema:    Peripheral edema absent.  Abdominal:     Palpations: Abdomen is soft non tender. There is no hepatomegaly.  Skin:    General: Skin is warm and dry.  Neurological:     General: No focal deficit present.     Mental Status: Alert and oriented to person, place and time.     Cranial Nerves: Cranial nerves are intact.  EKG/LABS/ Recent Cardiac Studies    ECG personally reviewed by me today - ***  Risk Assessment/Calculations:   {Does this patient have ATRIAL FIBRILLATION?[phone]}        Lab Results  Component Value Date   WBC 10.3 01/26/2022   HGB 9.1 (L) 01/26/2022   HCT 26.1 (L) 01/26/2022   MCV 91.3 01/26/2022   PLT 102 (L) 01/26/2022   Lab Results  Component Value Date   CREATININE 0.99 05/15/2022   BUN 13 05/15/2022   NA 140  05/15/2022   K 4.2 05/15/2022   CL 100 05/15/2022   CO2 27 05/15/2022   Lab Results  Component Value Date   ALT 21 05/15/2022   AST 15 05/15/2022   ALKPHOS 91 05/15/2022   BILITOT 0.5 05/15/2022   Lab Results  Component Value Date   CHOL 106 05/15/2022   HDL 36 (L) 05/15/2022   LDLCALC 55 05/15/2022   TRIG 70 05/15/2022   CHOLHDL 2.9 05/15/2022    Lab Results  Component Value Date   HGBA1C 5.9 (H) 01/19/2022    Cardiac Studies & Procedures   CARDIAC CATHETERIZATION  CARDIAC CATHETERIZATION 11/29/2021  Narrative   Ost RCA to Prox RCA lesion is  80% stenosed.   Prox LAD to Mid LAD lesion is 30% stenosed.   Prox Cx to Mid Cx lesion is 70% stenosed.   There is severe aortic valve stenosis.  1.  Severe single-vessel coronary artery disease with severe ostial stenosis of the RCA and left-to-right collaterals supplying the distal RCA branches which fill both antegrade and via collaterals 2.  Patent left main and LAD without significant stenosis 3.  Patent left circumflex into a large first OM and moderately severe stenosis of the AV circumflex beyond the OM supplying a small myocardial territory 4.  Severe calcific aortic stenosis with mean transvalvular gradient 50 mmHg, peak to peak gradient 61 mmHg, calculated valve area 0.89 cm 5.  Normal right heart hemodynamics  Recommendations: Favor medical therapy for CAD, CT angiography studies for planning of aortic valve replacement via TAVR versus SAVR  Findings Coronary Findings Diagnostic  Dominance: Right  Left Anterior Descending The LAD courses to the apex and supplies collaterals to the RCA.  The vessel has mild nonobstructive disease.  There is no significant stenosis in the LAD or its diagonal branches. Prox LAD to Mid LAD lesion is 30% stenosed.  Left Circumflex The circumflex is patent into a large obtuse marginal.  Just beyond the marginal origin, there is a moderately severe stenosis but a small myocardial  territory beyond the lesion. Prox Cx to Mid Cx lesion is 70% stenosed.  Right Coronary Artery Ost RCA to Prox RCA lesion is 80% stenosed. The lesion is calcified. Lesion difficult to visualize and may be more severe.  There is a well-formed left-to-right collateral and to and fro flow seen on RCA imaging.  There is no high-grade stenosis throughout the mid or distal portion of the RCA.  The only visible lesion is at the ostium.  Right Posterior Descending Artery Collaterals RPDA filled by collaterals from 1st Sept.  First Right Posterolateral Branch Collaterals 1st RPL filled by collaterals from 2nd Sept.  Intervention  No interventions have been documented.     ECHOCARDIOGRAM  ECHOCARDIOGRAM COMPLETE 03/07/2022  Narrative ECHOCARDIOGRAM REPORT    Patient Name:   CYRIL PROBUS Date of Exam: 03/07/2022 Medical Rec #:  [id number]        Height:       72.0 in Accession #:    [id number]       Weight:       298.0 lb Date of Birth:  [birth year]        BSA:          2.524 m Patient Age:    69 years         BP:           144/76 mmHg Patient Gender: M                HR:           59 bpm. Exam Location:  [facility]  Procedure: 2D Echo, Cardiac Doppler, Color Doppler and Intracardiac Opacification Agent  Indications:    I35.0 Nonrheumatic aortic (valve) stenosis  History:        Patient has prior history of Echocardiogram examinations, most recent 01/23/2022. Signs/Symptoms:Murmur and Dyspnea; Risk Factors:Hypertension and Dyslipidemia. Aortic valve replacement (67m Inspiris Resilia). Obesity. Hyperglycemia.  Sonographer:    ADiamond NickelRCS Referring Phys: LCheryln Manly IMPRESSIONS   1. Left ventricular ejection fraction, by estimation, is 60 to 65%. The left ventricle has normal function. The left ventricle has no regional wall motion abnormalities. Left  ventricular diastolic parameters were normal. 2. Right ventricular systolic function is normal. The right  ventricular size is normal. 3. Right atrial size was mild to moderately dilated. 4. The mitral valve is normal in structure. No evidence of mitral valve regurgitation. No evidence of mitral stenosis. 5. The aortic valve has been repaired/replaced. There is a 27 mm Inspiris Resilia valve present in the aortic position. No aortic stenosis is present. Aortic valve area, by VTI measures 3.79 cm. Aortic valve mean gradient measures 7.0 mmHg. Aortic valve Vmax measures 1.87 m/s. 6. Aortic dilatation noted. There is mild dilatation of the ascending aorta, measuring 41 mm. 7. The inferior vena cava is normal in size with greater than 50% respiratory variability, suggesting right atrial pressure of 3 mmHg. 8. Compared to echo dated 10/2021 there is now an AVR present functioning normally.  FINDINGS Left Ventricle: Left ventricular ejection fraction, by estimation, is 60 to 65%. The left ventricle has normal function. The left ventricle has no regional wall motion abnormalities. The left ventricular internal cavity size was normal in size. There is no left ventricular hypertrophy. Left ventricular diastolic parameters were normal. Normal left ventricular filling pressure.  Right Ventricle: The right ventricular size is normal. No increase in right ventricular wall thickness. Right ventricular systolic function is normal.  Left Atrium: Left atrial size was normal in size.  Right Atrium: Right atrial size was mild to moderately dilated.  Pericardium: There is no evidence of pericardial effusion.  Mitral Valve: The mitral valve is normal in structure. No evidence of mitral valve regurgitation. No evidence of mitral valve stenosis.  Tricuspid Valve: The tricuspid valve is normal in structure. Tricuspid valve regurgitation is not demonstrated. No evidence of tricuspid stenosis.  Aortic Valve: The aortic valve has been repaired/replaced. Aortic valve regurgitation is not visualized. No aortic stenosis is  present. Aortic valve mean gradient measures 7.0 mmHg. Aortic valve peak gradient measures 14.0 mmHg. Aortic valve area, by VTI measures 3.79 cm. There is a 27 mm Inspiris Resilia valve present in the aortic position.  Pulmonic Valve: The pulmonic valve was normal in structure. Pulmonic valve regurgitation is not visualized. No evidence of pulmonic stenosis.  Aorta: Aortic dilatation noted. There is mild dilatation of the ascending aorta, measuring 41 mm.  Venous: The inferior vena cava is normal in size with greater than 50% respiratory variability, suggesting right atrial pressure of 3 mmHg.  IAS/Shunts: No atrial level shunt detected by color flow Doppler.   LEFT VENTRICLE PLAX 2D LVIDd:         5.20 cm   Diastology LVIDs:         3.60 cm   LV e' medial:    10.70 cm/s LV PW:         1.20 cm   LV E/e' medial:  8.3 LV IVS:        0.80 cm   LV e' lateral:   11.00 cm/s LVOT diam:     2.70 cm   LV E/e' lateral: 8.1 LV SV:         164 LV SV Index:   65 LVOT Area:     5.73 cm   RIGHT VENTRICLE RV Basal diam:  2.80 cm RV S prime:     11.00 cm/s TAPSE (M-mode): 1.6 cm  LEFT ATRIUM             Index        RIGHT ATRIUM           Index  LA diam:        5.20 cm 2.06 cm/m   RA Area:     26.80 cm LA Vol (A2C):   56.2 ml 22.27 ml/m  RA Volume:   96.40 ml  38.19 ml/m LA Vol (A4C):   87.4 ml 34.63 ml/m LA Biplane Vol: 74.3 ml 29.44 ml/m AORTIC VALVE AV Area (Vmax):    3.58 cm AV Area (Vmean):   3.26 cm AV Area (VTI):     3.79 cm AV Vmax:           187.00 cm/s AV Vmean:          121.000 cm/s AV VTI:            0.433 m AV Peak Grad:      14.0 mmHg AV Mean Grad:      7.0 mmHg LVOT Vmax:         117.00 cm/s LVOT Vmean:        68.800 cm/s LVOT VTI:          0.287 m LVOT/AV VTI ratio: 0.66  AORTA Ao Asc diam: 4.10 cm  MITRAL VALVE MV Area (PHT): 2.94 cm    SHUNTS MV Decel Time: 258 msec    Systemic VTI:  0.29 m MV E velocity: 89.10 cm/s  Systemic Diam: 2.70 cm MV A  velocity: 65.00 cm/s MV E/A ratio:  1.37  Fransico Him MD Electronically signed by Fransico Him MD Signature Date/Time: 03/07/2022/1:05:05 PM    Final   TEE  ECHO INTRAOPERATIVE TEE 01/23/2022  Narrative *INTRAOPERATIVE TRANSESOPHAGEAL REPORT *    Patient Name:   KAHAAN ALVIZO Date of Exam: 01/23/2022 Medical Rec #:  [id number]        Height:       72.0 in Accession #:    [id number]       Weight:       302.6 lb Date of Birth:  04-Sep-1950        BSA:          2.54 m Patient Age:    71 years         BP:           122/71 mmHg Patient Gender: M                HR:           66 bpm. Exam Location:  Anesthesiology  Transesophogeal exam was perform intraoperatively during surgical procedure. Patient was closely monitored under general anesthesia during the entirety of examination.  Indications:     Aortic Stenosis i35.0, CAD Native Vessel i25.10 Sonographer:     Raquel Sarna Senior RDCS Performing Phys: [id number] Coralie Common Diagnosing Phys: Annye Asa MD  Complications: No known complications during this procedure. POST-OP IMPRESSIONS Limited post-CPB examination: the patient separated from CPB easily _ Left Ventricle: The left ventricular function appears normal, unchanged from pre-bypass. Overall EF 60% with normal wall motion and contractility. (The flattened septal motion originally seen with pacing on separation from bypass normalized once NSR resumed.) _ Right Ventricle: The right ventricle appears unchanged from pre-bypass. _ Aortic Valve: A bioprosthetic valve was placed in the aortic position. Leaflets are freely mobile. There is no aortic insufficiency or stenosis. There is no perivalvular leak. Mean gradient 5 mmHg, peak gradient 10 mmHg. _ Mitral Valve: The mitral valve appears unchanged from pre-bypass. _ Tricuspid Valve: The tricuspid valve appears unchanged from pre-bypass. Trivial TR remains visible around the PA catheter.  PRE-OP FINDINGS Left  Ventricle:  The left ventricle has normal systolic function, with an ejection fraction of 60-65%, measured 63%. The cavity size was normal. No evidence of left ventricular regional wall motion abnormalities. There is no left ventricular hypertrophy. Left ventricular diastolic function was not evaluated.  Right Ventricle: The right ventricle has normal systolic function. The cavity was normal. There is no increase in right ventricular wall thickness. Right ventricular systolic pressure is normal. Catheter present in the right ventricle.  Left Atrium: Left atrial size was normal in size. No left atrial/left atrial appendage thrombus was detected. Left atrial appendage velocity is normal at greater than 40 cm/s.  Right Atrium: Right atrial size was normal in size. Catheter present in the right atrium.  Interatrial Septum: No atrial level shunt detected by color flow Doppler. There is no evidence of a patent foramen ovale.  Pericardium: Trivial pericardial effusion is present.  Mitral Valve: The mitral valve is normal in structure. Mitral valve regurgitation is trivial by color flow Doppler. There is no evidence of mitral valve vegetation. Pulmonary venous flow is normal. There is no evidence of mitral stenosis, with peak gradient 3 mHg, mean gradient 1 mmHg.  Tricuspid Valve: The tricuspid valve was normal in structure. Tricuspid valve regurgitation is trivial by color flow Doppler. No evidence of tricuspid stenosis is present. There is no evidence of tricuspid valve vegetation.  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 The left and right cusps are thickened and appears partially fused. Aortic valve regurgitation is mild by color flow Doppler, directed anteriorly toward the mitral valve. There is severe stenosis of the aortic valve, with peak gradient 85 mmHg, mean gradient 51 mmHg. Vmax 460 cm/s. There is moderate thickening present on the aortic valve right coronary and  left coronary cusps with severely decreased mobility, with partial fusion of these leaflets.  Pulmonic Valve: The pulmonic valve was normal in structure, with normal leaflet mobiltiy. No evidence of pulmonic stenosis. Pulmonic valve regurgitation is trivial by color flow Doppler, round the PA catheter. .   Aorta: The aortic root, ascending aorta and aortic arch are normal in size and structure. LVOT measures 2.34 cm.  Pulmonary Artery: Gordy Councilman catheter present on the right. The pulmonary artery is of normal size.  Venous: The inferior vena cava is dilated in size with greater than 50% respiratory variability, suggesting right atrial pressure of 8 mmHg.  Shunts: There is no evidence of an atrial septal defect.  +-------------+---------++ AORTIC VALVE           +-------------+---------++ AV Mean Grad:51.0 mmHg +-------------+---------++  +-------------+--------++ MITRAL VALVE          +-------------+--------++ MV Mean grad:1.0 mmHg +-------------+--------++   Annye Asa MD Electronically signed by Annye Asa MD Signature Date/Time: 01/23/2022/12:57:48 PM    Final    CT SCANS  CT CORONARY MORPH W/CTA COR W/SCORE 12/15/2021  Addendum 12/15/2021  8:57 AM ADDENDUM REPORT: 12/15/2021 08:55  ADDENDUM: Extracardiac findings will be described separately under dictation for contemporaneously obtained CTA chest, abdomen and pelvis dated 12/14/2021. Please see that report for full description of relevant extracardiac findings.   Electronically Signed By: Vinnie Langton M.D. On: 12/15/2021 08:55  Narrative CLINICAL DATA:  Aortic Stenosis  EXAM: Cardiac TAVR CT  TECHNIQUE: The patient was scanned on a Siemens Force [id number] slice scanner. A 120 kV retrospective scan was triggered in the ascending thoracic aorta at 140 HU's. Gantry rotation speed was 250 msecs and collimation was .6 mm. No beta  blockade or nitro were given. The 3D data set  was reconstructed in 5% intervals of the R-R cycle. Systolic and diastolic phases were analyzed on a dedicated work station using MPR, MIP and VRT modes. The patient received 80 cc of contrast.  FINDINGS: Aortic Valve: Calcified tri leaflet with score 3469  Aorta: Mild aneurysmal dilatation normal arch vessels mild calcific atherosclerosis  Sino-tubular Junction: 34 mm  Ascending Thoracic Aorta: 39 mm  Aortic Arch: 29 mm  Descending Thoracic Aorta: 29 mm  Sinus of Valsalva Measurements:  Non-coronary: 33.7 mm  Right - coronary: 35.7 mm  Left -   coronary: 33.6 mm  Coronary Artery Height above Annulus:  Left Main: 17.6 mm above annulus  Right Coronary: 19.6 mm above annulus  Virtual Basal Annulus Measurements:  Maximum / Minimum Diameter: 29.4 mm x 27.7 mm  Perimeter: 88.2 mm  Area: 589 mm2  Coronary Arteries: Sufficient height above annulus for deployment  Optimum Fluoroscopic Angle for Delivery: LAO 5 Caudal 5 degrees  IMPRESSION: 1.  Tri leaflet AV with calcium score 3469  2. Annular area of 589 mm2 suitable for a 29 mm Sapien 3 Ultra valve  3.  Coronary arteries sufficient height above annulus for deployment  4.  Optimum angiographic angle for deployment LAO 5 Caudal 5 degrees  5.  Membranous septal length 8.4 mm  Jenkins Rouge  Electronically Signed: By: Jenkins Rouge M.D. On: 12/14/2021 12:19   CT SCANS  CT CARDIAC SCORING (SELF PAY ONLY) 12/12/2012  Addendum 12/12/2012  1:20 PM **ADDENDUM** CREATED: 12/12/2012 13:13:58  OVER-READ INTERPRETATION - CT CHEST  The following report is an over-read performed by radiologist Dr. Angelita Ingles, M.D. of [facility] on 12/12/2012 13:13:58.  This over-read does not include interpretation of cardiac or coronary anatomy or pathology.  The CTA interpretation by the cardiologist is attached.  Comparison:  07/30/2008  Findings: Within the left upper lobe there is a 5 mm, image 9/series  4.  This is new when compared with the previous exam.  No airspace consolidation or atelectasis identified.  The heart size appears normal.  There is no pericardial effusion.  No adenopathy identified.  The visualized portions of the esophagus appear normal.  Coronary artery calcifications are noted involving the LAD, left circumflex, and RCA coronary arteries.  Spondylosis is identified within the thoracic spine.  IMPRESSION:  1.  5 mm nodule is identified in the left upper lobe.  Not seen on previous exam. If the patient is at high risk for bronchogenic carcinoma, follow-up chest CT at 6-12 months is recommended.  If the patient is at low risk for bronchogenic carcinoma, follow-up chest CT at 12 months is recommended.  This recommendation follows the consensus statement: Guidelines for Management of Small Pulmonary Nodules Detected on CT Scans: A Statement from the [employer] as published in Radiology 2005; 237:395-400.  These results will be called to the ordering clinician or representative by the Radiologist Assistant, and communication documented in the PACS Dashboard.  **END ADDENDUM** SIGNED BY: Angelita Ingles, M.D.  Narrative Coronary Calcium Score:  Indication: Risk Stratification  Protocol:  The patient was scanned on a Siemens Sensation 16 slice scanner.  67m axial non contrast images were carried out through the heart. The data set was scored on a dedicated work station using the [employer] Findings:  Ascneding Aorta some calcification and mildly dilated 3.8 cm  Pericardium Normal  Calcium Score 141 scattered in the mid and distal LAD , RCA  and proximal circumflex  Impression:  Coronary calcium Score 141  This is 66th percentile for age and sex matched controls.  Jenkins Rouge MD [facility]  Original Report Authenticated By: Jenkins Rouge, M.D.          Assessment & Plan    1.  Coronary artery disease: -s/p CABG x 1 for treatment of  single-vessel severe ostial stenosis with SVG to RCA -Today patient reports no anginal chest pain and shortness of breath has resolved since previous visit. -Continue current GDMT with ASA 81 mg, Lipitor 80 mg, and Toprol-XL 100 mg daily   2.  Severe aortic stenosis: -Severe calcific aortic stenosis with mean transvalvular gradient 50 mmHg, peak to peak  -s/p AVR repair with 27 mm bioprosthetic -Today patient reports no shortness of breath or anginal discomfort -2D echo completed recently with aortic mean gradient of 7.0 -SBE prophylaxis discussed   3.  Essential hypertension: -Patient's blood pressure today was slightly elevated at 140/80 -We will have him document blood pressures over the next 2 weeks and may change metoprolol 100 mg to carvedilol if BP remains elevated.   4.  Hyperlipidemia: -Patient's LDL cholesterol was 75 in 09/2021 -Continue atorvastatin 80 mg   5.  Morbid obesity: -Patient's BMI is 42.07 kg/m  -Patient was advised to increase physical activity to at least 150 minutes/weeks of moderate intensity exercise      Disposition: Follow-up with Jenkins Rouge, MD or APP in *** months {Are you ordering a CV Procedure (e.g. stress test, cath, DCCV, TEE, etc)?   [employer] F[employer]        :[id number]   Medication Adjustments/Labs and Tests Ordered: Current medicines are reviewed at length with the patient today.  Concerns regarding medicines are outlined above.   Signed, Mable Fill, Marissa Nestle, NP 05/27/2022, 1:50 PM [facility]  Note:  This document was prepared using Dragon voice recognition software and may include unintentional dictation errors.

## 2022-05-28 ENCOUNTER — Encounter: Payer: Self-pay | Admitting: Nurse Practitioner

## 2022-05-28 ENCOUNTER — Ambulatory Visit: Payer: Medicare Other | Attending: Nurse Practitioner | Admitting: Nurse Practitioner

## 2022-05-28 VITALS — BP 148/74 | HR 70 | Ht 72.0 in | Wt 294.0 lb

## 2022-05-28 DIAGNOSIS — I2510 Atherosclerotic heart disease of native coronary artery without angina pectoris: Secondary | ICD-10-CM

## 2022-05-28 DIAGNOSIS — I350 Nonrheumatic aortic (valve) stenosis: Secondary | ICD-10-CM | POA: Diagnosis not present

## 2022-05-28 DIAGNOSIS — E7800 Pure hypercholesterolemia, unspecified: Secondary | ICD-10-CM

## 2022-05-28 DIAGNOSIS — Z6841 Body Mass Index (BMI) 40.0 and over, adult: Secondary | ICD-10-CM

## 2022-05-28 DIAGNOSIS — I10 Essential (primary) hypertension: Secondary | ICD-10-CM

## 2022-05-28 MED ORDER — VALSARTAN-HYDROCHLOROTHIAZIDE 160-25 MG PO TABS: 1.0000 | ORAL_TABLET | Freq: Every day | ORAL | 1 refills | Status: DC

## 2022-05-28 NOTE — Patient Instructions (Signed)
Medication Instructions:  Take your blood pressure medication in the morning and Metoprolol in the evening  CHANGE Valsartan-HCTZ 80/'25mg'$  Take 1 tablet once a day every morning  *If you need a refill on your cardiac medications before your next appointment, please call your pharmacy*   Lab Work: None ordered If you have labs (blood work) drawn today and your tests are completely normal, you will receive your results only by: [place] (if you have MyChart) OR A paper copy in the mail If you have any lab test that is abnormal or we need to change your treatment, we will call you to review the results.   Testing/Procedures: None ordered   Follow-Up: At [facility], you and your health needs are our priority.  As part of our continuing mission to provide you with exceptional heart care, we have created designated Provider Care Teams.  These Care Teams include your primary Cardiologist (physician) and Advanced Practice Providers (APPs -  Physician Assistants and Nurse Practitioners) who all work together to provide you with the care you need, when you need it.  We recommend signing up for the patient portal called "MyChart".  Sign up information is provided on this After Visit Summary.  MyChart is used to connect with patients for Virtual Visits (Telemedicine).  Patients are able to view lab/test results, encounter notes, upcoming appointments, etc.  Non-urgent messages can be sent to your provider as well.   To learn more about what you can do with MyChart, go to [email or web address].    Your next appointment:   6 month(s)  Provider:   Jenkins Rouge, MD     Other Instructions

## 2022-05-29 DIAGNOSIS — H401131 Primary open-angle glaucoma, bilateral, mild stage: Secondary | ICD-10-CM | POA: Diagnosis not present

## 2022-05-31 MED ORDER — LEVOTHYROXINE SODIUM 50 MCG PO TABS
50 MCG | ORAL_TABLET | ORAL | 0 refills | Status: AC
Start: 2022-05-31 — End: 2022-08-08

## 2022-05-31 NOTE — Telephone Encounter (Signed)
Lv 07/26/2021

## 2022-05-31 NOTE — Telephone Encounter (Signed)
Pt advised

## 2022-06-07 DIAGNOSIS — E785 Hyperlipidemia, unspecified: Secondary | ICD-10-CM | POA: Diagnosis not present

## 2022-06-07 DIAGNOSIS — I25709 Atherosclerosis of coronary artery bypass graft(s), unspecified, with unspecified angina pectoris: Secondary | ICD-10-CM | POA: Diagnosis not present

## 2022-06-07 DIAGNOSIS — I10 Essential (primary) hypertension: Secondary | ICD-10-CM | POA: Diagnosis not present

## 2022-06-07 DIAGNOSIS — R7309 Other abnormal glucose: Secondary | ICD-10-CM | POA: Diagnosis not present

## 2022-06-07 DIAGNOSIS — I700 Atherosclerosis of aorta: Secondary | ICD-10-CM | POA: Diagnosis not present

## 2022-08-02 ENCOUNTER — Telehealth

## 2022-08-02 NOTE — Telephone Encounter (Signed)
Fasting labs ordered

## 2022-08-02 NOTE — Telephone Encounter (Signed)
Patient calling, he has yearly appt with you Monday and is asking if you can order his labs now? He'll go tomorrow morning

## 2022-08-02 NOTE — Telephone Encounter (Signed)
Left detailed message for pt 

## 2022-08-04 ENCOUNTER — Ambulatory Visit: Admit: 2022-08-04 | Discharge: 2022-08-04 | Payer: BLUE CROSS/BLUE SHIELD | Attending: Family | Primary: Family

## 2022-08-04 DIAGNOSIS — L089 Local infection of the skin and subcutaneous tissue, unspecified: Secondary | ICD-10-CM

## 2022-08-04 MED ORDER — MUPIROCIN 2 % EX OINT
2 | CUTANEOUS | 0 refills | Status: DC
Start: 2022-08-04 — End: 2022-08-09

## 2022-08-04 MED ORDER — CEPHALEXIN 500 MG PO CAPS
500 | ORAL_CAPSULE | Freq: Three times a day (TID) | ORAL | 0 refills | Status: DC
Start: 2022-08-04 — End: 2022-08-09

## 2022-08-04 NOTE — Progress Notes (Addendum)
[facility], [facility]  [facility] [place] [facility]  [address]  [place] [address]  Dept: [phone]  Dept Fax: [phone]     Dustin Zimmerman is a 72 y.o. male who presents to the urgent care today for his medicalconditions/complaints as noted below.  Dustin Zimmerman is c/o of Leg Injury (Lower leg hit on a hitch )    HPI:      Rash  This is a new problem. The current episode started in the past 7 days (6 days ago, initially cut himself on a hitch). The problem has been gradually worsening since onset. The affected locations include the left lower leg. The rash is characterized by pain, redness and swelling. Associated with: cut on a hitch 6 days ago. Pertinent negatives include no cough, fatigue, fever or shortness of breath. Treatments tried: peroxide. The treatment provided no relief.     Last TDaP UTD in 2018.    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 15 g 0    levothyroxine (SYNTHROID) 50 MCG tablet TAKE 1 TABLET BY MOUTH DAILY 90 tablet 0    pantoprazole (PROTONIX) 40 MG tablet       omeprazole (PRILOSEC) 40 MG delayed release capsule TAKE 1 CAPSULE BY MOUTH EVERY MORNING BEFORE BREAKFAST 30 capsule 3    dorzolamide-timolol (COSOPT) 22.3-6.8 MG/ML ophthalmic solution Place 2 drops into both eyes every 12 hours 1 Bottle 3    diphenhydrAMINE HCl (BENADRYL ALLERGY PO) Take by mouth To help sleep and skin itching (Patient not taking: Reported on 08/04/2022)      nystatin (MYCOSTATIN)  100000 UNIT/GM ointment Apply topically 2 times daily. (Patient not taking: Reported on 08/04/2022) 30 g 1     No current facility-administered medications for this visit.     Allergies   Allergen Reactions    Motrin [Ibuprofen] Other (See Comments)     kidney    Percocet [Oxycodone-Acetaminophen] Hives    Motrin [Ibuprofen] Nausea And Vomiting     kidney    Percocet [Oxycodone-Acetaminophen] Rash     Reviewed PMH, SH, and FH with the patient and updated.    Subjective:      Review of Systems   Constitutional:  Negative for chills, fatigue and fever.   Respiratory: Negative.  Negative for cough, chest tightness, shortness of breath and wheezing.    Cardiovascular: Negative.  Negative for chest pain.   Musculoskeletal:  Negative for arthralgias and joint swelling.   Skin:  Positive for rash (left lower leg surrounding wound) and wound (left lower leg).   All other systems reviewed and are negative.    Objective:      Physical Exam  Vitals and nursing note reviewed.   Constitutional:       General: He is not in acute distress.  Appearance: He is well-developed. He is not diaphoretic.   HENT:      Head: Normocephalic and atraumatic.   Cardiovascular:      Rate and Rhythm: Normal rate and regular rhythm.      Heart sounds: Normal heart sounds. No murmur heard.  Pulmonary:      Effort: Pulmonary effort is normal. No respiratory distress.      Breath sounds: Normal breath sounds. No wheezing.   Skin:     General: Skin is warm and dry.      Findings: Laceration (scabbed laceration with purulent drainage and surrounding erythema noted to the anterior aspect of the left lower leg) present.   Neurological:      Mental Status: He is alert.       BP (!) 172/103   Pulse 68   Ht 1.854 m (6\' 1" )   Wt 95.3 kg (210 lb)   SpO2 100%   BMI 27.71 kg/m     Assessment:   Assessment & Plan    Diagnosis Orders   1. Laceration of left lower leg with infection, initial encounter  cephALEXin (KEFLEX) 500 MG capsule    mupirocin  (BACTROBAN) 2 % ointment      2. Elevated blood pressure reading          Plan:      Patient instructed to complete antibiotic prescription fully.  Bactroban ointment to be applied topically TID.  May use Tylenol for fever/pain.  Advised patient to monitor blood pressure, if continues to be elevated-- follow up with PCP for further management. Patient expressed understanding.  Educational materials provided on AVS.  Follow up if symptoms do not improve/worsen.    Orders Placed This Encounter   Medications    cephALEXin (KEFLEX) 500 MG capsule     Sig: Take 1 capsule by mouth 3 times daily for 5 days     Dispense:  15 capsule     Refill:  0    mupirocin (BACTROBAN) 2 % ointment     Sig: Apply topically 3 times daily.     Dispense:  15 g     Refill:  0        Patient given educational materials - see patient instructions.Discussed use, benefit, and side effects of prescribed medications.  All patientquestions answered.  Pt voiced understanding.    Electronically signed by Lavell Islam, APRN - CNP on 5/11/2024at 10:33 AM

## 2022-08-06 ENCOUNTER — Ambulatory Visit: Admit: 2022-08-06 | Discharge: 2022-08-06 | Payer: BLUE CROSS/BLUE SHIELD | Attending: Family | Primary: Family

## 2022-08-06 DIAGNOSIS — Z0000 Encounter for general adult medical examination without abnormal findings: Secondary | ICD-10-CM

## 2022-08-06 MED ORDER — LORAZEPAM 0.5 MG PO TABS
0.5 | ORAL_TABLET | Freq: Every evening | ORAL | 0 refills | Status: AC | PRN
Start: 2022-08-06 — End: 2022-08-18

## 2022-08-06 NOTE — Progress Notes (Signed)
Visit Information    Have you changed or started any medications since your last visit including any over-the-counter medicines, vitamins, or herbal medicines? no   Have you stopped taking any of your medications? Is so, why? -  no  Are you having any side effects from any of your medications? - no    Have you seen any other physician or provider since your last visit?  no   Have you had any other diagnostic tests since your last visit?  no   Have you been seen in the emergency room and/or had an admission in a hospital since we last saw you?  no   Have you had your routine dental cleaning in the past 6 months?  no     Do you have an active MyChart account? If no, what is the barrier?  No: na    Patient Care Team:  Kris Mouton, APRN - CNP as PCP - General (Nurse Practitioner)  Kris Mouton, APRN - CNP as PCP - Empaneled Provider  Daiber, Bryan Lemma, MD    Medical History Review  Past Medical, Family, and Social History reviewed and  contribute to the patient presenting condition    Health Maintenance   Topic Date Due    COVID-19 Vaccine (1) Never done    Shingles vaccine (1 of 2) Never done    Respiratory Syncytial Virus (RSV) Pregnant or age 101 yrs+ (1 - 1-dose 60+ series) Never done    A1C test (Diabetic or Prediabetic)  06/27/2021    Flu vaccine (Season Ended) 10/25/2022    Depression Screen  08/04/2023    Colorectal Cancer Screen  04/20/2025    Lipids  06/27/2025    DTaP/Tdap/Td vaccine (3 - Td or Tdap) 06/14/2026    Pneumococcal 65+ years Vaccine  Completed    AAA screen  Completed    Hepatitis C screen  Addressed    Hepatitis A vaccine  Aged Out    Hepatitis B vaccine  Aged Out    Hib vaccine  Aged Out    Polio vaccine  Aged Out    Meningococcal (ACWY) vaccine  Aged Out

## 2022-08-06 NOTE — Progress Notes (Signed)
[facility]  [address]  [place], [place] [address]  [phone]      Dustin Zimmerman is a 72 y.o. male who presents today for his  medicalconditions/complaints as noted below.  [name] is c/o of Annual Exam  .    HPI:    HPI  Patient here today for well exam.  States he is stable on thyroid medication with no side effects.  He is also currently on antibiotics for an abrasion to his left leg.  Denies fever, chills or leg weakness.  Would like routine blood work orders printed as they were recently ordered.  States he would like short-term sleep aid as he has been struggling with sleep lately and Benadryl and melatonin have either caused side effects or not helpful.  States he gets in patterns like this and just needs a few days to catch up and will get back on track.  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)     Myalgia     Polyp of colon 04/17/2017      Past Surgical History:   Procedure Laterality Date    EYE SURGERY      FOOT SURGERY      middle toe    KNEE SURGERY      SHOULDER SURGERY       Family History   Problem Relation Age of Onset    No Known Problems Mother     Other Father         aspiration pneumonia    Diabetes Brother      Social History     Tobacco Use    Smoking status: Former     Current packs/day: 1.00     Average packs/day: 1 pack/day for 10.0 years (10.0 ttl pk-yrs)     Types: Pipe, Cigarettes    Smokeless tobacco: Never   Substance Use Topics    Alcohol use: Yes     Alcohol/week: 0.0 standard drinks of alcohol     Comment: occ      Current Outpatient Medications   Medication Sig Dispense Refill    LORazepam (ATIVAN) 0.5 MG tablet Take 1 tablet by mouth nightly as needed for Anxiety for up to 12 days. 12 tablet 0    cephALEXin (KEFLEX) 500 MG capsule Take 1 capsule  by mouth 3 times daily for 5 days 15 capsule 0    levothyroxine (SYNTHROID) 50 MCG tablet TAKE 1 TABLET BY MOUTH DAILY 90 tablet 0    mupirocin (BACTROBAN) 2 % ointment Apply topically 3 times daily. 15 g 0    pantoprazole (PROTONIX) 40 MG tablet  (Patient not taking: Reported on 08/06/2022)      omeprazole (PRILOSEC) 40 MG delayed release capsule TAKE 1 CAPSULE BY MOUTH EVERY MORNING BEFORE BREAKFAST (Patient not taking: Reported on 08/06/2022) 30 capsule 3    nystatin (MYCOSTATIN) 100000 UNIT/GM ointment Apply topically 2 times daily. (Patient not taking: Reported on 08/04/2022) 30 g 1    dorzolamide-timolol (COSOPT) 22.3-6.8 MG/ML ophthalmic solution Place 2 drops into both eyes every 12 hours 1 Bottle 3     No current facility-administered medications for this visit.     Allergies   Allergen Reactions  Motrin [Ibuprofen] Other (See Comments)     kidney    Percocet [Oxycodone-Acetaminophen] Hives    Motrin [Ibuprofen] Nausea And Vomiting     kidney    Percocet [Oxycodone-Acetaminophen] Rash       Health Maintenance   Topic Date Due    COVID-19 Vaccine (1) Never done    Shingles vaccine (1 of 2) Never done    Respiratory Syncytial Virus (RSV) Pregnant or age 11 yrs+ (1 - 49-dose 60+ series) Never done    A1C test (Diabetic or Prediabetic)  06/27/2021    Flu vaccine (Season Ended) 10/25/2022    Depression Screen  08/04/2023    Colorectal Cancer Screen  04/20/2025    Lipids  06/27/2025    DTaP/Tdap/Td vaccine (3 - Td or Tdap) 06/14/2026    Pneumococcal 65+ years Vaccine  Completed    AAA screen  Completed    Hepatitis C screen  Addressed    Hepatitis A vaccine  Aged Out    Hepatitis B vaccine  Aged Out    Hib vaccine  Aged Out    Polio vaccine  Aged Out    Meningococcal (ACWY) vaccine  Aged Out       Subjective:      Review of Systems   Constitutional:  Negative for activity change, chills, fatigue, fever and unexpected weight change.   HENT:  Negative for congestion, ear pain, postnasal drip, rhinorrhea and sore  throat.    Eyes:  Negative for pain and visual disturbance.   Respiratory:  Negative for cough, chest tightness and shortness of breath.    Cardiovascular:  Negative for chest pain, palpitations and leg swelling.   Gastrointestinal:  Negative for abdominal pain, blood in stool, constipation, diarrhea, nausea and rectal pain.   Endocrine: Negative for polydipsia, polyphagia and polyuria.   Genitourinary:  Negative for difficulty urinating, dysuria, hematuria, penile pain, penile swelling, scrotal swelling and testicular pain.   Musculoskeletal:  Negative for back pain and myalgias.   Skin:  Positive for wound (left lower leg wound healing). Negative for color change.   Allergic/Immunologic: Negative for immunocompromised state.   Neurological:  Negative for dizziness, weakness, numbness and headaches.   Hematological:  Negative for adenopathy. Does not bruise/bleed easily.   Psychiatric/Behavioral:  Positive for sleep disturbance. Negative for dysphoric mood. The patient is nervous/anxious.          Objective:      Physical Exam  Vitals and nursing note reviewed.   Constitutional:       General: He is not in acute distress.     Appearance: Normal appearance. He is well-developed. He is not ill-appearing or diaphoretic.      Comments: BP 128/64 (Site: Right Upper Arm, Position: Sitting, Cuff Size: Medium Adult)   Pulse 68   Temp 97.9 F (36.6 C) (Tympanic)   Ht 1.854 m (6\' 1" )   Wt 95.2 kg (209 lb 12.8 oz)   SpO2 97%   BMI 27.68 kg/m      HENT:      Head: Normocephalic and atraumatic.      Right Ear: Hearing, tympanic membrane, ear canal and external ear normal.      Left Ear: Hearing, tympanic membrane, ear canal and external ear normal.      Nose: Nose normal.      Mouth/Throat:      Pharynx: No oropharyngeal exudate.   Eyes:      Conjunctiva/sclera: Conjunctivae normal.      Pupils: Pupils are  equal, round, and reactive to light.   Neck:      Thyroid: No thyromegaly.      Vascular: No JVD.   Cardiovascular:       Rate and Rhythm: Normal rate and regular rhythm.      Heart sounds: Murmur heard.      Comments: NO LE edema  Pulmonary:      Effort: Pulmonary effort is normal. No respiratory distress.      Breath sounds: Normal breath sounds. No wheezing.   Abdominal:      General: Bowel sounds are normal.      Palpations: Abdomen is soft.      Tenderness: There is no abdominal tenderness.   Musculoskeletal:         General: Normal range of motion.      Cervical back: Normal range of motion and neck supple.   Lymphadenopathy:      Cervical: No cervical adenopathy.   Skin:     General: Skin is warm and dry.      Capillary Refill: Capillary refill takes less than 2 seconds.      Findings: Erythema (mild to left lower leg abrasion) present. No bruising or rash.   Neurological:      General: No focal deficit present.      Mental Status: He is alert and oriented to person, place, and time. Mental status is at baseline.      Motor: No weakness or abnormal muscle tone.      Coordination: Coordination normal.      Gait: Gait normal.   Psychiatric:         Mood and Affect: Mood normal.         Behavior: Behavior normal.         Thought Content: Thought content normal.         Judgment: Judgment normal.         Assessment:       Diagnosis Orders   1. Routine general medical examination at a health care facility        2. Hypothyroidism, unspecified type        3. Anxiety  LORazepam (ATIVAN) 0.5 MG tablet      4. Drug-induced insomnia (HCC)  LORazepam (ATIVAN) 0.5 MG tablet        Wt Readings from Last 3 Encounters:   08/06/22 95.2 kg (209 lb 12.8 oz)   08/04/22 95.3 kg (210 lb)   07/26/21 97.9 kg (215 lb 12.8 oz)     BP Readings from Last 3 Encounters:   08/06/22 128/64   08/04/22 (!) 172/103   07/26/21 126/72         Plan:      Return in about 1 year (around 08/06/2023) for routine healthcare visit.  Insomnia:  Recurrent problem  Small refill given for ativan short term  Avoid distractions/tv before bed    Patient advised to follow a  low carb, low sugar, healthy fat ( avocados, nuts, olive oil etc.) plant based diet and recommend 150 minutes of cardiovascular exercise per week as able and tolerated.   The nature of sun-induced photo-aging and skin cancers is discussed.  Sun avoidance, protective clothing, and the use of 30-SPF sunscreens is advised. Observe closely for skin damage/changes, and call if such occurs.  Dentist q 6 months, eye MD once yearly   Has routine labs to complete already, fasting  Continues with cardio/urology/gi  Ok for RSV/shingrix at pharmacy    Orders Placed  This Encounter   Medications    LORazepam (ATIVAN) 0.5 MG tablet     Sig: Take 1 tablet by mouth nightly as needed for Anxiety for up to 12 days.     Dispense:  12 tablet     Refill:  0       Patient given educational materials - see patient instructions.  Discussed use,benefit, and side effects of prescribed medications.  All patient questions answered.Pt voiced understanding. Reviewed health maintenance.  Instructed to continue currentmedications, diet and exercise.    Electronically signed by Kris Mouton, APRN - CNP,CNP on 08/06/2022 at 4:54 PM

## 2022-08-07 ENCOUNTER — Inpatient Hospital Stay: Payer: BLUE CROSS/BLUE SHIELD | Primary: Family

## 2022-08-07 DIAGNOSIS — Z79899 Other long term (current) drug therapy: Secondary | ICD-10-CM

## 2022-08-07 LAB — CBC WITH AUTO DIFFERENTIAL
Basophils %: 1 % (ref 0–2)
Basophils Absolute: 0.04 10*3/uL (ref 0.00–0.20)
Eosinophils %: 3 % (ref 1–4)
Eosinophils Absolute: 0.22 10*3/uL (ref 0.00–0.44)
Hematocrit: 42.7 % (ref 40.7–50.3)
Hemoglobin: 13.9 g/dL (ref 13.0–17.0)
Immature Granulocytes %: 1 % — ABNORMAL HIGH
Immature Granulocytes Absolute: 0.03 10*3/uL (ref 0.00–0.30)
Lymphocytes %: 31 % (ref 24–43)
Lymphocytes Absolute: 2.04 10*3/uL (ref 1.10–3.70)
MCH: 30.5 pg (ref 25.2–33.5)
MCHC: 32.6 g/dL (ref 28.4–34.8)
MCV: 93.8 fL (ref 82.6–102.9)
MPV: 10.5 fL (ref 8.1–13.5)
Monocytes %: 11 % (ref 3–12)
Monocytes Absolute: 0.72 10*3/uL (ref 0.10–1.20)
NRBC Automated: 0 per 100 WBC
Neutrophils %: 53 % (ref 36–65)
Neutrophils Absolute: 3.45 10*3/uL (ref 1.50–8.10)
Platelets: 319 10*3/uL (ref 138–453)
RBC: 4.55 m/uL (ref 4.21–5.77)
RDW: 13.5 % (ref 11.8–14.4)
WBC: 6.5 10*3/uL (ref 3.5–11.3)

## 2022-08-08 LAB — COMPREHENSIVE METABOLIC PANEL
ALT: 45 U/L (ref 10–50)
AST: 33 U/L (ref 10–50)
Albumin/Globulin Ratio: 2 (ref 1.0–2.5)
Albumin: 4.7 g/dL (ref 3.5–5.2)
Alkaline Phosphatase: 63 U/L (ref 40–129)
Anion Gap: 11 mmol/L (ref 9–16)
BUN: 18 mg/dL (ref 8–23)
CO2: 29 mmol/L (ref 20–31)
Calcium: 9.7 mg/dL (ref 8.6–10.4)
Chloride: 99 mmol/L (ref 98–107)
Creatinine: 0.9 mg/dL (ref 0.70–1.20)
Est, Glom Filt Rate: 86 mL/min/{1.73_m2} (ref >60)
Glucose: 90 mg/dL (ref 74–99)
Potassium: 4.6 mmol/L (ref 3.7–5.3)
Sodium: 139 mmol/L (ref 136–145)
Total Bilirubin: 0.5 mg/dL (ref 0.00–1.20)
Total Protein: 7 g/dL (ref 6.6–8.7)

## 2022-08-08 LAB — LIPID PANEL
Chol/HDL Ratio: 4
Cholesterol, Total: 226 mg/dL — ABNORMAL HIGH (ref 0–199)
HDL: 56 mg/dL (ref >40)
LDL Cholesterol: 157 mg/dL — ABNORMAL HIGH (ref 0–100)
Triglycerides: 67 mg/dL (ref <150)
VLDL: 13 mg/dL

## 2022-08-08 LAB — CREATININE
Creatinine: 0.9 mg/dL (ref 0.70–1.20)
Est, Glom Filt Rate: 87 mL/min/{1.73_m2} (ref >60)

## 2022-08-08 LAB — VITAMIN B12
Vitamin B-12: 384 pg/mL (ref 232–1245)
Vitamin B-12: 399 pg/mL (ref 232–1245)

## 2022-08-08 LAB — CALCIUM: Calcium: 9.5 mg/dL (ref 8.6–10.4)

## 2022-08-08 LAB — PSA SCREENING: PSA: 3.9 ng/mL (ref 0.00–4.00)

## 2022-08-08 LAB — TSH: TSH: 5.36 u[IU]/mL — ABNORMAL HIGH (ref 0.27–4.20)

## 2022-08-08 LAB — MAGNESIUM
Magnesium: 2 mg/dL (ref 1.6–2.4)
Magnesium: 2.1 mg/dL (ref 1.6–2.4)

## 2022-08-08 LAB — T4, FREE: T4 Free: 1.2 ng/dL (ref 0.92–1.68)

## 2022-08-08 MED ORDER — LEVOTHYROXINE SODIUM 50 MCG PO TABS
50 MCG | ORAL_TABLET | ORAL | 3 refills | Status: DC
Start: 2022-08-08 — End: 2022-08-22

## 2022-08-08 NOTE — Telephone Encounter (Signed)
Last OV: 08/06/2022

## 2022-08-09 ENCOUNTER — Encounter

## 2022-08-09 MED ORDER — CEPHALEXIN 500 MG PO CAPS
500 | ORAL_CAPSULE | Freq: Three times a day (TID) | ORAL | 0 refills | Status: AC
Start: 2022-08-09 — End: 2022-08-14

## 2022-08-09 MED ORDER — MUPIROCIN 2 % EX OINT
2 | CUTANEOUS | 0 refills | Status: AC
Start: 2022-08-09 — End: ?

## 2022-08-10 MED ORDER — DOXYCYCLINE HYCLATE 100 MG PO TABS
100 MG | ORAL_TABLET | Freq: Two times a day (BID) | ORAL | 0 refills | Status: AC
Start: 2022-08-10 — End: 2022-08-17

## 2022-08-10 NOTE — Telephone Encounter (Signed)
We tried to call him about they thyroid but his VM box was full and I had advised to keep dose same if only symptom was dry skin as it was barely over limit. New abx called in

## 2022-08-10 NOTE — Telephone Encounter (Signed)
Patient called and stated he was prescribed Keflex 500mg  at the walk in on 08/04/22. Patient then had an appt with you on 08/06/22 and states you advised him to call the office when he finished the RX from the walk in because he needed to start something stronger. Patient states he picked up the Keflex RX that you sent in and realized it was the same as the walk in script.   Patient states he started taking it but was wondering if something different was going to be sent in?    Patient also states he thought you mentioned something about his levothyroxine script changing and noticed it was the same strength that was previously sent in.     Please advise.

## 2022-08-10 NOTE — Telephone Encounter (Signed)
Attempt to call patient; left VM for patient to call the office back.

## 2022-08-13 NOTE — Telephone Encounter (Signed)
Called and LM for patient to callback.

## 2022-08-15 NOTE — Telephone Encounter (Signed)
Patient advised.

## 2022-08-22 MED ORDER — LEVOTHYROXINE SODIUM 75 MCG PO TABS
75 MCG | ORAL_TABLET | Freq: Every day | ORAL | 0 refills | Status: DC
Start: 2022-08-22 — End: 2022-09-25

## 2022-08-22 MED ORDER — LEVOTHYROXINE SODIUM 75 MCG PO TABS
75 | ORAL_TABLET | Freq: Every day | ORAL | 0 refills | Status: DC
Start: 2022-08-22 — End: 2022-08-22

## 2022-09-25 MED ORDER — LEVOTHYROXINE SODIUM 75 MCG PO TABS
75 MCG | ORAL_TABLET | Freq: Every day | ORAL | 1 refills | Status: AC
Start: 2022-09-25 — End: ?

## 2022-09-25 NOTE — Telephone Encounter (Signed)
Lv 08/06/2022

## 2022-10-04 ENCOUNTER — Ambulatory Visit: Payer: Medicare Other | Admitting: Cardiovascular Disease

## 2022-10-24 NOTE — Progress Notes (Signed)
Office Visit    Patient Name: Malik [name] Date of Encounter: 10/29/2022  Primary Care Provider:  Adrian Prince, MD Primary Cardiologist:  [name], MD Primary Electrophysiologist: None  Chief Complaint    Malik [name] is a 72 y.o. male with PMH of CAD s/p CABG x 1 and AVR with bioprosthetic valve, HTN, HLD, pulmonary nodule, frequent PVCs, GERD who presents today for f/u  Past Medical History    Past Medical History:  Diagnosis Date   Aortic stenosis    Arthritis    ASHD (arteriosclerotic heart disease)    Chest pressure    COVID-19 virus infection 11/2019   x 3   GERD (gastroesophageal reflux disease)    Heart murmur    History of kidney stones    Hyperglycemia    Hyperlipidemia    Hypertension    Nephrolithiasis    Obesity    Past Surgical History:  Procedure Laterality Date   AORTIC VALVE REPLACEMENT N/A 01/23/2022   Procedure: AORTIC VALVE REPLACEMENT (AVR) USING INSPIRIS RESILIA  AORTIC VALVE;  Surgeon: Eugenio Hoes, MD;  Location: [facility] OR;  Service: Open Heart Surgery;  Laterality: N/A;  Mid sternotomy   APPENDECTOMY     CIRCUMCISION N/A 11/03/2019   Procedure: CIRCUMCISION ADULT;  Surgeon: Bjorn Pippin, MD;  Location: [facility] ORS;  Service: Urology;  Laterality: N/A;   CORONARY ARTERY BYPASS GRAFT N/A 01/23/2022   Procedure: CORONARY ARTERY BYPASS GRAFTING (CABGX1) USING ENDOSCOPICALLY HARVESTED RIGHT GREATER SAPHENOUS VEIN;  Surgeon: Eugenio Hoes, MD;  Location: [facility] OR;  Service: Open Heart Surgery;  Laterality: N/A;   RIGHT/LEFT HEART CATH AND CORONARY ANGIOGRAPHY N/A 11/29/2021   Procedure: RIGHT/LEFT HEART CATH AND CORONARY ANGIOGRAPHY;  Surgeon: Tonny Bollman, MD;  Location: [facility] INVASIVE CV LAB;  Service: Cardiovascular;  Laterality: N/A;   TEE WITHOUT CARDIOVERSION N/A 01/23/2022   Procedure: TRANSESOPHAGEAL ECHOCARDIOGRAM (TEE);  Surgeon: Eugenio Hoes, MD;  Location: [facility] OR;  Service: Open Heart Surgery;  Laterality: N/A;    Allergies  No  Known Allergies  History of Present Illness    Malik [name]  is a 72 year old male with the above mention past medical history who presents today for follow-up.  Seen by PA recently for labile BP. Tried on coreg but caused headaches Then started on ARB and diuretic. 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 Walking some  AVR with 27 mm Inspiris resilia valve TTE 2024 with mean gradient 7 mmHg and normal EF  He had a single SVG to RCA at time of his valve surgery  He has had some dental work and took his SBE prior     Home Medications    Current Outpatient Medications  Medication Sig Dispense Refill   acetaminophen (TYLENOL) 325 MG tablet Take 2 tablets (650 mg total) by mouth every 4 (four) hours as needed.     amoxicillin (AMOXIL) 500 MG tablet Take 4 tablets by mouth one hour prior to dental procedures. 4 tablet 2   aspirin EC 325 MG tablet Take 1 tablet (325 mg total) by mouth daily.     atorvastatin (LIPITOR) 80 MG tablet Take 1 tablet (80 mg total) by mouth daily. 90 tablet 1   latanoprost (XALATAN) 0.005 % ophthalmic solution Place 1 drop into both eyes at bedtime.     metoprolol succinate (TOPROL-XL) 25 MG 24 hr tablet Take 1 tablet (25 mg total) by mouth daily. 45 tablet 11   valsartan-hydrochlorothiazide (DIOVAN-HCT) 160-25  MG tablet Take 1 tablet by mouth daily. 90 tablet 1   No current facility-administered medications for this visit.     Review of Systems  Please see the history of present illness.    (+) Chronic knee pain  All other systems reviewed and are otherwise negative except as noted above.  Physical Exam    Wt Readings from Last 3 Encounters:  10/29/22 292 lb 12.8 oz (132.8 kg)  05/28/22 294 lb (133.4 kg)  05/08/22 294 lb 9.6 oz (133.6 kg)   VS: Vitals:   10/29/22 0837  BP: 124/72  Pulse: (!) 56  SpO2: 97%  ,Body mass index is 39.71 kg/m.  Constitutional:      Appearance: Healthy appearance. Not in distress.  Neck:      Vascular: JVD normal.  Pulmonary:     Effort: Pulmonary effort is normal.     Breath sounds: No wheezing. No rales. Diminished in the bases Cardiovascular:     Normal rate. Regular rhythm. Normal S1. Normal S2.      Murmurs: There is no murmur.  Edema:    Peripheral edema absent.  Abdominal:     Palpations: Abdomen is soft non tender. There is no hepatomegaly.  Skin:    General: Skin is warm and dry.  Neurological:     General: No focal deficit present.     Mental Status: Alert and oriented to person, place and time.     Cranial Nerves: Cranial nerves are intact.  EKG/LABS/ Recent Cardiac Studies    ECG personally reviewed by me today -none completed today  Lab Results  Component Value Date   WBC 10.3 01/26/2022   HGB 9.1 (L) 01/26/2022   HCT 26.1 (L) 01/26/2022   MCV 91.3 01/26/2022   PLT 102 (L) 01/26/2022   Lab Results  Component Value Date   CREATININE 0.99 05/15/2022   BUN 13 05/15/2022   NA 140 05/15/2022   K 4.2 05/15/2022   CL 100 05/15/2022   CO2 27 05/15/2022   Lab Results  Component Value Date   ALT 21 05/15/2022   AST 15 05/15/2022   ALKPHOS 91 05/15/2022   BILITOT 0.5 05/15/2022   Lab Results  Component Value Date   CHOL 106 05/15/2022   HDL 36 (L) 05/15/2022   LDLCALC 55 05/15/2022   TRIG 70 05/15/2022   CHOLHDL 2.9 05/15/2022    Lab Results  Component Value Date   HGBA1C 5.9 (H) 01/19/2022    Cardiac Studies & Procedures   CARDIAC CATHETERIZATION  CARDIAC CATHETERIZATION 11/29/2021  Narrative   Ost RCA to Prox RCA lesion is 80% stenosed.   Prox LAD to Mid LAD lesion is 30% stenosed.   Prox Cx to Mid Cx lesion is 70% stenosed.   There is severe aortic valve stenosis.  1.  Severe single-vessel coronary artery disease with severe ostial stenosis of the RCA and left-to-right collaterals supplying the distal RCA branches which fill both antegrade and via collaterals 2.  Patent left main and LAD without significant stenosis 3.  Patent left  circumflex into a large first OM and moderately severe stenosis of the AV circumflex beyond the OM supplying a small myocardial territory 4.  Severe calcific aortic stenosis with mean transvalvular gradient 50 mmHg, peak to peak gradient 61 mmHg, calculated valve area 0.89 cm 5.  Normal right heart hemodynamics  Recommendations: Favor medical therapy for CAD, CT angiography studies for planning of aortic valve replacement via TAVR versus SAVR  Findings Coronary Findings  Diagnostic  Dominance: Right  Left Anterior Descending The LAD courses to the apex and supplies collaterals to the RCA.  The vessel has mild nonobstructive disease.  There is no significant stenosis in the LAD or its diagonal branches. Prox LAD to Mid LAD lesion is 30% stenosed.  Left Circumflex The circumflex is patent into a large obtuse marginal.  Just beyond the marginal origin, there is a moderately severe stenosis but a small myocardial territory beyond the lesion. Prox Cx to Mid Cx lesion is 70% stenosed.  Right Coronary Artery Ost RCA to Prox RCA lesion is 80% stenosed. The lesion is calcified. Lesion difficult to visualize and may be more severe.  There is a well-formed left-to-right collateral and to and fro flow seen on RCA imaging.  There is no high-grade stenosis throughout the mid or distal portion of the RCA.  The only visible lesion is at the ostium.  Right Posterior Descending Artery Collaterals RPDA filled by collaterals from 1st Sept.  First Right Posterolateral Branch Collaterals 1st RPL filled by collaterals from 2nd Sept.  Intervention  No interventions have been documented.     ECHOCARDIOGRAM  ECHOCARDIOGRAM COMPLETE 03/07/2022  Narrative ECHOCARDIOGRAM REPORT    Patient Name:   BARI HANDSHOE Date of Exam: 03/07/2022 Medical Rec #:  [id number]        Height:       72.0 in Accession #:    [id number]       Weight:       298.0 lb Date of Birth:  1950/06/06        BSA:          2.524  m Patient Age:    71 years         BP:           144/76 mmHg Patient Gender: M                HR:           59 bpm. Exam Location:  [address]  Procedure: 2D Echo, Cardiac Doppler, Color Doppler and Intracardiac Opacification Agent  Indications:    I35.0 Nonrheumatic aortic (valve) stenosis  History:        Patient has prior history of Echocardiogram examinations, most recent 01/23/2022. Signs/Symptoms:Murmur and Dyspnea; Risk Factors:Hypertension and Dyslipidemia. Aortic valve replacement (27mm Inspiris Resilia). Obesity. Hyperglycemia.  Sonographer:    Cathie Beams RCS Referring Phys: Arty Baumgartner  IMPRESSIONS   1. Left ventricular ejection fraction, by estimation, is 60 to 65%. The left ventricle has normal function. The left ventricle has no regional wall motion abnormalities. Left ventricular diastolic parameters were normal. 2. Right ventricular systolic function is normal. The right ventricular size is normal. 3. Right atrial size was mild to moderately dilated. 4. The mitral valve is normal in structure. No evidence of mitral valve regurgitation. No evidence of mitral stenosis. 5. The aortic valve has been repaired/replaced. There is a 27 mm Inspiris Resilia valve present in the aortic position. No aortic stenosis is present. Aortic valve area, by VTI measures 3.79 cm. Aortic valve mean gradient measures 7.0 mmHg. Aortic valve Vmax measures 1.87 m/s. 6. Aortic dilatation noted. There is mild dilatation of the ascending aorta, measuring 41 mm. 7. The inferior vena cava is normal in size with greater than 50% respiratory variability, suggesting right atrial pressure of 3 mmHg. 8. Compared to echo dated 10/2021 there is now an AVR present functioning normally.  FINDINGS Left Ventricle: Left ventricular ejection fraction,  by estimation, is 60 to 65%. The left ventricle has normal function. The left ventricle has no regional wall motion abnormalities. The left  ventricular internal cavity size was normal in size. There is no left ventricular hypertrophy. Left ventricular diastolic parameters were normal. Normal left ventricular filling pressure.  Right Ventricle: The right ventricular size is normal. No increase in right ventricular wall thickness. Right ventricular systolic function is normal.  Left Atrium: Left atrial size was normal in size.  Right Atrium: Right atrial size was mild to moderately dilated.  Pericardium: There is no evidence of pericardial effusion.  Mitral Valve: The mitral valve is normal in structure. No evidence of mitral valve regurgitation. No evidence of mitral valve stenosis.  Tricuspid Valve: The tricuspid valve is normal in structure. Tricuspid valve regurgitation is not demonstrated. No evidence of tricuspid stenosis.  Aortic Valve: The aortic valve has been repaired/replaced. Aortic valve regurgitation is not visualized. No aortic stenosis is present. Aortic valve mean gradient measures 7.0 mmHg. Aortic valve peak gradient measures 14.0 mmHg. Aortic valve area, by VTI measures 3.79 cm. There is a 27 mm Inspiris Resilia valve present in the aortic position.  Pulmonic Valve: The pulmonic valve was normal in structure. Pulmonic valve regurgitation is not visualized. No evidence of pulmonic stenosis.  Aorta: Aortic dilatation noted. There is mild dilatation of the ascending aorta, measuring 41 mm.  Venous: The inferior vena cava is normal in size with greater than 50% respiratory variability, suggesting right atrial pressure of 3 mmHg.  IAS/Shunts: No atrial level shunt detected by color flow Doppler.   LEFT VENTRICLE PLAX 2D LVIDd:         5.20 cm   Diastology LVIDs:         3.60 cm   LV e' medial:    10.70 cm/s LV PW:         1.20 cm   LV E/e' medial:  8.3 LV IVS:        0.80 cm   LV e' lateral:   11.00 cm/s LVOT diam:     2.70 cm   LV E/e' lateral: 8.1 LV SV:         164 LV SV Index:   65 LVOT Area:     5.73  cm   RIGHT VENTRICLE RV Basal diam:  2.80 cm RV S prime:     11.00 cm/s TAPSE (M-mode): 1.6 cm  LEFT ATRIUM             Index        RIGHT ATRIUM           Index LA diam:        5.20 cm 2.06 cm/m   RA Area:     26.80 cm LA Vol (A2C):   56.2 ml 22.27 ml/m  RA Volume:   96.40 ml  38.19 ml/m LA Vol (A4C):   87.4 ml 34.63 ml/m LA Biplane Vol: 74.3 ml 29.44 ml/m AORTIC VALVE AV Area (Vmax):    3.58 cm AV Area (Vmean):   3.26 cm AV Area (VTI):     3.79 cm AV Vmax:           187.00 cm/s AV Vmean:          121.000 cm/s AV VTI:            0.433 m AV Peak Grad:      14.0 mmHg AV Mean Grad:      7.0 mmHg LVOT Vmax:  117.00 cm/s LVOT Vmean:        68.800 cm/s LVOT VTI:          0.287 m LVOT/AV VTI ratio: 0.66  AORTA Ao Asc diam: 4.10 cm  MITRAL VALVE MV Area (PHT): 2.94 cm    SHUNTS MV Decel Time: 258 msec    Systemic VTI:  0.29 m MV E velocity: 89.10 cm/s  Systemic Diam: 2.70 cm MV A velocity: 65.00 cm/s MV E/A ratio:  1.37  Armanda Magic MD Electronically signed by Armanda Magic MD Signature Date/Time: 03/07/2022/1:05:05 PM    Final   TEE  ECHO INTRAOPERATIVE TEE 01/23/2022  Narrative *INTRAOPERATIVE TRANSESOPHAGEAL REPORT *    Patient Name:   HAARIS METALLO Date of Exam: 01/23/2022 Medical Rec #:  [id number]        Height:       72.0 in Accession #:    [id number]       Weight:       302.6 lb Date of Birth:  03-04-1951        BSA:          2.54 m Patient Age:    71 years         BP:           122/71 mmHg Patient Gender: M                HR:           66 bpm. Exam Location:  Anesthesiology  Transesophogeal exam was perform intraoperatively during surgical procedure. Patient was closely monitored under general anesthesia during the entirety of examination.  Indications:     Aortic Stenosis i35.0, CAD Native Vessel i25.10 Sonographer:     Irving Burton Senior RDCS Performing Phys: [id number] Eugenio Hoes Diagnosing Phys: Jairo Ben  MD  Complications: No known complications during this procedure. POST-OP IMPRESSIONS Limited post-CPB examination: the patient separated from CPB easily _ Left Ventricle: The left ventricular function appears normal, unchanged from pre-bypass. Overall EF 60% with normal wall motion and contractility. (The flattened septal motion originally seen with pacing on separation from bypass normalized once NSR resumed.) _ Right Ventricle: The right ventricle appears unchanged from pre-bypass. _ Aortic Valve: A bioprosthetic valve was placed in the aortic position. Leaflets are freely mobile. There is no aortic insufficiency or stenosis. There is no perivalvular leak. Mean gradient 5 mmHg, peak gradient 10 mmHg. _ Mitral Valve: The mitral valve appears unchanged from pre-bypass. _ Tricuspid Valve: The tricuspid valve appears unchanged from pre-bypass. Trivial TR remains visible around the PA catheter.  PRE-OP FINDINGS Left Ventricle: The left ventricle has normal systolic function, with an ejection fraction of 60-65%, measured 63%. The cavity size was normal. No evidence of left ventricular regional wall motion abnormalities. There is no left ventricular hypertrophy. Left ventricular diastolic function was not evaluated.  Right Ventricle: The right ventricle has normal systolic function. The cavity was normal. There is no increase in right ventricular wall thickness. Right ventricular systolic pressure is normal. Catheter present in the right ventricle.  Left Atrium: Left atrial size was normal in size. No left atrial/left atrial appendage thrombus was detected. Left atrial appendage velocity is normal at greater than 40 cm/s.  Right Atrium: Right atrial size was normal in size. Catheter present in the right atrium.  Interatrial Septum: No atrial level shunt detected by color flow Doppler. There is no evidence of a patent foramen ovale.  Pericardium: Trivial pericardial effusion is  present.  Mitral Valve: The  mitral valve is normal in structure. Mitral valve regurgitation is trivial by color flow Doppler. There is no evidence of mitral valve vegetation. Pulmonary venous flow is normal. There is no evidence of mitral stenosis, with peak gradient 3 mHg, mean gradient 1 mmHg.  Tricuspid Valve: The tricuspid valve was normal in structure. Tricuspid valve regurgitation is trivial by color flow Doppler. No evidence of tricuspid stenosis is present. There is no evidence of tricuspid valve vegetation.  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 The left and right cusps are thickened and appears partially fused. Aortic valve regurgitation is mild by color flow Doppler, directed anteriorly toward the mitral valve. There is severe stenosis of the aortic valve, with peak gradient 85 mmHg, mean gradient 51 mmHg. Vmax 460 cm/s. There is moderate thickening present on the aortic valve right coronary and left coronary cusps with severely decreased mobility, with partial fusion of these leaflets.  Pulmonic Valve: The pulmonic valve was normal in structure, with normal leaflet mobiltiy. No evidence of pulmonic stenosis. Pulmonic valve regurgitation is trivial by color flow Doppler, round the PA catheter. .   Aorta: The aortic root, ascending aorta and aortic arch are normal in size and structure. LVOT measures 2.34 cm.  Pulmonary Artery: Theone Murdoch catheter present on the right. The pulmonary artery is of normal size.  Venous: The inferior vena cava is dilated in size with greater than 50% respiratory variability, suggesting right atrial pressure of 8 mmHg.  Shunts: There is no evidence of an atrial septal defect.  +-------------+---------++ AORTIC VALVE           +-------------+---------++ AV Mean Grad:51.0 mmHg +-------------+---------++  +-------------+--------++ MITRAL VALVE          +-------------+--------++ MV Mean grad:1.0  mmHg +-------------+--------++   Jairo Ben MD Electronically signed by Jairo Ben MD Signature Date/Time: 01/23/2022/12:57:48 PM    Final    CT SCANS  CT CORONARY MORPH W/CTA COR W/SCORE 12/14/2021  Addendum 12/15/2021  8:57 AM ADDENDUM REPORT: 12/15/2021 08:55  ADDENDUM: Extracardiac findings will be described separately under dictation for contemporaneously obtained CTA chest, abdomen and pelvis dated 12/14/2021. Please see that report for full description of relevant extracardiac findings.   Electronically Signed By: Trudie Reed M.D. On: 12/15/2021 08:55  Narrative CLINICAL DATA:  Aortic Stenosis  EXAM: Cardiac TAVR CT  TECHNIQUE: The patient was scanned on a Siemens Force 192 slice scanner. A 120 kV retrospective scan was triggered in the ascending thoracic aorta at 140 HU's. Gantry rotation speed was 250 msecs and collimation was .6 mm. No beta blockade or nitro were given. The 3D data set was reconstructed in 5% intervals of the R-R cycle. Systolic and diastolic phases were analyzed on a dedicated work station using MPR, MIP and VRT modes. The patient received 80 cc of contrast.  FINDINGS: Aortic Valve: Calcified tri leaflet with score 3469  Aorta: Mild aneurysmal dilatation normal arch vessels mild calcific atherosclerosis  Sino-tubular Junction: 34 mm  Ascending Thoracic Aorta: 39 mm  Aortic Arch: 29 mm  Descending Thoracic Aorta: 29 mm  Sinus of Valsalva Measurements:  Non-coronary: 33.7 mm  Right - coronary: 35.7 mm  Left -   coronary: 33.6 mm  Coronary Artery Height above Annulus:  Left Main: 17.6 mm above annulus  Right Coronary: 19.6 mm above annulus  Virtual Basal Annulus Measurements:  Maximum / Minimum Diameter: 29.4 mm x 27.7 mm  Perimeter: 88.2 mm  Area: 589 mm2  Coronary Arteries: Sufficient height  above annulus for deployment  Optimum Fluoroscopic Angle for Delivery: LAO 5 Caudal 5  degrees  IMPRESSION: 1.  Tri leaflet AV with calcium score 3469  2. Annular area of 589 mm2 suitable for a 29 mm Sapien 3 Ultra valve  3.  Coronary arteries sufficient height above annulus for deployment  4.  Optimum angiographic angle for deployment LAO 5 Caudal 5 degrees  5.  Membranous septal length 8.4 mm  [name]  Electronically Signed: By: [name] M.D. On: 12/14/2021 12:19   CT SCANS  CT CARDIAC SCORING (SELF PAY ONLY) 12/12/2012  Addendum 12/12/2012  1:20 PM **ADDENDUM** CREATED: 12/12/2012 13:13:58  OVER-READ INTERPRETATION - CT CHEST  The following report is an over-read performed by radiologist Dr. [name], M.D. of [facility] on 12/12/2012 13:13:58.  This over-read does not include interpretation of cardiac or coronary anatomy or pathology.  The CTA interpretation by the cardiologist is attached.  Comparison:  07/30/2008  Findings: Within the left upper lobe there is a 5 mm, image 9/series 4.  This is new when compared with the previous exam.  No airspace consolidation or atelectasis identified.  The heart size appears normal.  There is no pericardial effusion.  No adenopathy identified.  The visualized portions of the esophagus appear normal.  Coronary artery calcifications are noted involving the LAD, left circumflex, and RCA coronary arteries.  Spondylosis is identified within the thoracic spine.  IMPRESSION:  1.  5 mm nodule is identified in the left upper lobe.  Not seen on previous exam. If the patient is at high risk for bronchogenic carcinoma, follow-up chest CT at 6-12 months is recommended.  If the patient is at low risk for bronchogenic carcinoma, follow-up chest CT at 12 months is recommended.  This recommendation follows the consensus statement: Guidelines for Management of Small Pulmonary Nodules Detected on CT Scans: A Statement from the Fleischner Society as published in Radiology 2005;  237:395-400.  These results will be called to the ordering clinician or representative by the Radiologist Assistant, and communication documented in the PACS Dashboard.  **END ADDENDUM** SIGNED BY: [name], M.D.  Narrative Coronary Calcium Score:  Indication: Risk Stratification  Protocol:  The patient was scanned on a Siemens Sensation 16 slice scanner.  3mm axial non contrast images were carried out through the heart. The data set was scored on a dedicated work station using the [employer]   Findings:  Ascneding Aorta some calcification and mildly dilated 3.8 cm  Pericardium Normal  Calcium Score 141 scattered in the mid and distal LAD , RCA and proximal circumflex  Impression:  Coronary calcium Score 141  This is 66th percentile for age and sex matched controls.  [name] MD [facility]  Original Report Authenticated By: [name], M.D.          Assessment & Plan    1.  Coronary artery disease: -s/p CABG x 1 12/24/21 by Dr Leafy Ro for treatment of single-vessel severe ostial stenosis with SVG to RCA -No angina -Continue current GDMT with ASA 81 mg, Lipitor 80 mg, and Toprol-XL 25 mg   2.  Severe aortic stenosis: -Severe calcific aortic stenosis with mean transvalvular gradient 50 mmHg, peak to peak  -s/p AVR repair with 27 mm bioprosthetic 12/24/21 [facility] -Today patient reports no shortness of breath or anginal discomfort -2D echo completed 03/07/22 with aortic mean gradient of 7.0 -SBE prophylaxis discussed and being followed for recent bone graft and dental work   3.  Essential hypertension: -Patient's blood pressure has been in the 120s to 130s systolically at home. -We will continue current antihypertensive regimen with valsartan 80 mg and HCTZ 25 mg, Toprol XL 25 mg  4.  Hyperlipidemia: -Patient's LDL cholesterol was 55 in 07/4096 -Continue atorvastatin 80 mg   5.  Morbid obesity: -Patient's BMI is 42.07 kg/m  -Patient was advised to  increase physical activity to at least 150 minutes/weeks of moderate intensity exercise  Disposition: Follow-up  in a year   Medication Adjustments/Labs and Tests Ordered: Current medicines are reviewed at length with the patient today.  Concerns regarding medicines are outlined above.   SignedCharlton Haws, NP 10/29/2022, 8:43 AM

## 2022-10-29 ENCOUNTER — Ambulatory Visit: Payer: Medicare Other | Admitting: Cardiovascular Disease

## 2022-10-29 ENCOUNTER — Encounter: Payer: Self-pay | Admitting: Cardiovascular Disease

## 2022-10-29 VITALS — BP 124/72 | HR 56 | Ht 72.0 in | Wt 292.8 lb

## 2022-10-29 DIAGNOSIS — I2510 Atherosclerotic heart disease of native coronary artery without angina pectoris: Secondary | ICD-10-CM | POA: Diagnosis not present

## 2022-10-29 DIAGNOSIS — Z952 Presence of prosthetic heart valve: Secondary | ICD-10-CM

## 2022-10-29 DIAGNOSIS — I10 Essential (primary) hypertension: Secondary | ICD-10-CM | POA: Diagnosis not present

## 2022-10-29 DIAGNOSIS — E782 Mixed hyperlipidemia: Secondary | ICD-10-CM | POA: Diagnosis not present

## 2022-10-29 MED ORDER — VALSARTAN-HYDROCHLOROTHIAZIDE 160-25 MG PO TABS: 1.0000 | ORAL_TABLET | Freq: Every day | ORAL | 3 refills | Status: DC

## 2022-10-29 MED ORDER — ATORVASTATIN CALCIUM 80 MG PO TABS: 80.0000 mg | ORAL_TABLET | Freq: Every day | ORAL | 3 refills | Status: DC

## 2022-10-29 MED ORDER — METOPROLOL SUCCINATE ER 25 MG PO TB24: 25.0000 mg | ORAL_TABLET | Freq: Every day | ORAL | 3 refills | Status: DC

## 2022-10-29 NOTE — Patient Instructions (Signed)

## 2022-11-07 ENCOUNTER — Encounter

## 2022-11-22 MED ORDER — LEVOTHYROXINE SODIUM 75 MCG PO TABS
75 | ORAL_TABLET | Freq: Every day | ORAL | 0 refills | Status: DC
Start: 2022-11-22 — End: 2023-01-25

## 2022-11-22 NOTE — Telephone Encounter (Signed)
 Lm advising pt contact office

## 2022-11-22 NOTE — Telephone Encounter (Signed)
 Patient needing 90 day supply with refills.

## 2022-11-23 NOTE — Telephone Encounter (Signed)
 Lm advising pt contact office

## 2022-11-27 NOTE — Telephone Encounter (Signed)
 Patient advised.

## 2022-12-04 DIAGNOSIS — H401131 Primary open-angle glaucoma, bilateral, mild stage: Secondary | ICD-10-CM | POA: Diagnosis not present

## 2022-12-05 DIAGNOSIS — R7309 Other abnormal glucose: Secondary | ICD-10-CM | POA: Diagnosis not present

## 2022-12-05 DIAGNOSIS — Z125 Encounter for screening for malignant neoplasm of prostate: Secondary | ICD-10-CM | POA: Diagnosis not present

## 2022-12-05 DIAGNOSIS — E785 Hyperlipidemia, unspecified: Secondary | ICD-10-CM | POA: Diagnosis not present

## 2022-12-10 DIAGNOSIS — Z0000 Encounter for general adult medical examination without abnormal findings: Secondary | ICD-10-CM | POA: Diagnosis not present

## 2022-12-10 DIAGNOSIS — Z1212 Encounter for screening for malignant neoplasm of rectum: Secondary | ICD-10-CM | POA: Diagnosis not present

## 2022-12-10 DIAGNOSIS — Z1339 Encounter for screening examination for other mental health and behavioral disorders: Secondary | ICD-10-CM | POA: Diagnosis not present

## 2022-12-10 DIAGNOSIS — I10 Essential (primary) hypertension: Secondary | ICD-10-CM | POA: Diagnosis not present

## 2022-12-10 DIAGNOSIS — Z23 Encounter for immunization: Secondary | ICD-10-CM | POA: Diagnosis not present

## 2022-12-10 DIAGNOSIS — R82998 Other abnormal findings in urine: Secondary | ICD-10-CM | POA: Diagnosis not present

## 2022-12-10 DIAGNOSIS — J439 Emphysema, unspecified: Secondary | ICD-10-CM | POA: Diagnosis not present

## 2022-12-10 DIAGNOSIS — Z1331 Encounter for screening for depression: Secondary | ICD-10-CM | POA: Diagnosis not present

## 2022-12-27 DIAGNOSIS — H524 Presbyopia: Secondary | ICD-10-CM | POA: Diagnosis not present

## 2023-01-24 ENCOUNTER — Inpatient Hospital Stay: Payer: BLUE CROSS/BLUE SHIELD | Primary: Family

## 2023-01-24 ENCOUNTER — Encounter

## 2023-01-24 DIAGNOSIS — E039 Hypothyroidism, unspecified: Secondary | ICD-10-CM

## 2023-01-24 LAB — CBC WITH AUTO DIFFERENTIAL
Basophils %: 1 % (ref 0–2)
Basophils Absolute: 0.03 10*3/uL (ref 0.00–0.20)
Eosinophils %: 5 % — ABNORMAL HIGH (ref 1–4)
Eosinophils Absolute: 0.3 10*3/uL (ref 0.00–0.44)
Hematocrit: 46.2 % (ref 40.7–50.3)
Hemoglobin: 15 g/dL (ref 13.0–17.0)
Immature Granulocytes %: 1 % — ABNORMAL HIGH
Immature Granulocytes Absolute: 0.03 10*3/uL (ref 0.00–0.30)
Lymphocytes %: 26 % (ref 24–43)
Lymphocytes Absolute: 1.71 10*3/uL (ref 1.10–3.70)
MCH: 30.7 pg (ref 25.2–33.5)
MCHC: 32.5 g/dL (ref 28.4–34.8)
MCV: 94.7 fL (ref 82.6–102.9)
MPV: 10 fL (ref 8.1–13.5)
Monocytes %: 12 % (ref 3–12)
Monocytes Absolute: 0.78 10*3/uL (ref 0.10–1.20)
NRBC Automated: 0 /100{WBCs}
Neutrophils %: 55 % (ref 36–65)
Neutrophils Absolute: 3.65 10*3/uL (ref 1.50–8.10)
Platelets: 303 10*3/uL (ref 138–453)
RBC: 4.88 m/uL (ref 4.21–5.77)
RDW: 13.2 % (ref 11.8–14.4)
WBC: 6.5 10*3/uL (ref 3.5–11.3)

## 2023-01-24 LAB — T4, FREE: T4 Free: 1.2 ng/dL (ref 0.92–1.68)

## 2023-01-24 LAB — TSH: TSH: 3.11 u[IU]/mL (ref 0.27–4.20)

## 2023-01-24 LAB — URIC ACID: Uric Acid: 6.2 mg/dL (ref 3.4–7.0)

## 2023-01-25 MED ORDER — LEVOTHYROXINE SODIUM 75 MCG PO TABS
75 | ORAL_TABLET | Freq: Every day | ORAL | 3 refills | Status: DC
Start: 2023-01-25 — End: 2023-12-03

## 2023-01-28 ENCOUNTER — Other Ambulatory Visit: Admit: 2023-01-28 | Discharge: 2023-01-28 | Payer: BLUE CROSS/BLUE SHIELD | Attending: Family | Primary: Family

## 2023-01-28 ENCOUNTER — Ambulatory Visit: Admit: 2023-01-28 | Discharge: 2023-01-28 | Payer: BLUE CROSS/BLUE SHIELD | Attending: Family | Primary: Family

## 2023-01-28 ENCOUNTER — Ambulatory Visit: Admit: 2023-01-28 | Discharge: 2023-02-01 | Payer: BLUE CROSS/BLUE SHIELD | Primary: Family

## 2023-01-28 DIAGNOSIS — M79675 Pain in left toe(s): Secondary | ICD-10-CM

## 2023-01-28 LAB — POCT GLYCOSYLATED HEMOGLOBIN (HGB A1C): Hemoglobin A1C: 5.6 %

## 2023-01-28 NOTE — Progress Notes (Signed)
Visit Information    Have you changed or started any medications since your last visit including any over-the-counter medicines, vitamins, or herbal medicines? no   Have you stopped taking any of your medications? Is so, why? -  no  Are you having any side effects from any of your medications? - no    Have you seen any other physician or provider since your last visit?  no   Have you had any other diagnostic tests since your last visit?  no   Have you been seen in the emergency room and/or had an admission in a hospital since we last saw you?  no   Have you had your routine dental cleaning in the past 6 months?  no     Do you have an active MyChart account? If no, what is the barrier?  no    Patient Care Team:  Kris Mouton, APRN - CNP as PCP - General (Nurse Practitioner)  Kris Mouton, APRN - CNP as PCP - Empaneled Provider  Alger Memos, MD    Medical History Review  Past Medical, Family, and Social History reviewed and  contribute to the patient presenting condition    Health Maintenance   Topic Date Due    Shingles vaccine (1 of 2) Never done    Respiratory Syncytial Virus (RSV) Pregnant or age 8 yrs+ (1 - 1-dose 60+ series) Never done    A1C test (Diabetic or Prediabetic)  06/27/2021    Flu vaccine (1) Never done    COVID-19 Vaccine (1 - 2023-24 season) Never done    Depression Screen  08/04/2023    Colorectal Cancer Screen  04/20/2025    DTaP/Tdap/Td vaccine (3 - Td or Tdap) 06/14/2026    Lipids  08/07/2027    Pneumococcal 65+ years Vaccine  Completed    AAA screen  Completed    Hepatitis C screen  Addressed    Hepatitis A vaccine  Aged Out    Hepatitis B vaccine  Aged Out    Hib vaccine  Aged Out    Polio vaccine  Aged Out    Meningococcal (ACWY) vaccine  Aged Out

## 2023-01-28 NOTE — Progress Notes (Signed)
[facility]  [address]  [place], [place] [address]  [phone]      Dustin Zimmerman is a 72 y.o. male who presents today for his  medicalconditions/complaints as noted below.  [name] is c/o of Toe Pain (Last 2 toes on feet, once in a while the middle toe hurts, started after wearing dress shoes a month ago and thought a blister was forming.  Pain on/off, just not going away.  Had uric acid checked)  .    HPI:    HPI     72 yo male here today for evaluation of left toe pain in 4-5th toes, occasionally 3rd toe. Stated 1 month ago when wearing some tighter fitting dress shoes and bothering him daily since then. Denies any redness or swelling., did have recent labs and uric acid was normal. States he has tried loose fitting shoes, heat, corn cushions and only made It worse.     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)     Myalgia     Polyp of colon 04/17/2017      Past Surgical History:   Procedure Laterality Date    EYE SURGERY      FOOT SURGERY      middle toe    KNEE SURGERY      SHOULDER SURGERY       Family History   Problem Relation Age of Onset    No Known Problems Mother     Other Father         aspiration pneumonia    Diabetes Brother      Social History     Tobacco Use    Smoking status: Former     Current packs/day: 1.00     Average packs/day: 1 pack/day for 10.0 years (10.0 ttl pk-yrs)     Types: Pipe, Cigarettes    Smokeless tobacco: Never   Substance Use Topics    Alcohol use: Yes     Alcohol/week: 0.0 standard drinks of alcohol     Comment: occ      Current Outpatient Medications   Medication Sig Dispense Refill    levothyroxine (SYNTHROID) 75 MCG tablet Take 1 tablet by mouth daily 90 tablet 3    mupirocin (BACTROBAN) 2 % ointment Apply topically 3 times daily. 15 g 0     pantoprazole (PROTONIX) 40 MG tablet  (Patient not taking: Reported on 08/06/2022)      omeprazole (PRILOSEC) 40 MG delayed release capsule TAKE 1 CAPSULE BY MOUTH EVERY MORNING BEFORE BREAKFAST (Patient not taking: Reported on 08/06/2022) 30 capsule 3    nystatin (MYCOSTATIN) 100000 UNIT/GM ointment Apply topically 2 times daily. (Patient not taking: Reported on 08/04/2022) 30 g 1    dorzolamide-timolol (COSOPT) 22.3-6.8 MG/ML ophthalmic solution Place 2 drops into both eyes every 12 hours 1 Bottle 3     No current facility-administered medications for this visit.     Allergies   Allergen Reactions    Motrin [Ibuprofen] Other (See Comments)     kidney    Percocet [Oxycodone-Acetaminophen] Hives    Motrin [Ibuprofen] Nausea And Vomiting     kidney    Percocet [Oxycodone-Acetaminophen] Rash  Health Maintenance   Topic Date Due    Shingles vaccine (1 of 2) Never done    Respiratory Syncytial Virus (RSV) Pregnant or age 66 yrs+ (1 - 1-dose 60+ series) Never done    COVID-19 Vaccine (1 - 2023-24 season) Never done    Flu vaccine (1) 01/28/2024 (Originally 10/25/2022)    Depression Screen  08/04/2023    Colorectal Cancer Screen  04/20/2025    DTaP/Tdap/Td vaccine (3 - Td or Tdap) 06/14/2026    Lipids  08/07/2027    Pneumococcal 65+ years Vaccine  Completed    AAA screen  Completed    Hepatitis C screen  Addressed    Hepatitis A vaccine  Aged Out    Hepatitis B vaccine  Aged Out    Hib vaccine  Aged Out    Polio vaccine  Aged Out    Meningococcal (ACWY) vaccine  Aged Out    A1C test (Diabetic or Prediabetic)  Discontinued       Subjective:      Review of Systems   Constitutional:  Positive for activity change. Negative for appetite change, chills, diaphoresis, fatigue and fever.   Eyes:  Negative for visual disturbance.   Respiratory:  Negative for cough, chest tightness and shortness of breath.    Cardiovascular:  Negative for chest pain, palpitations and leg swelling.   Gastrointestinal:  Negative for abdominal pain,  nausea and vomiting.   Endocrine: Negative for cold intolerance and heat intolerance.   Genitourinary:  Negative for difficulty urinating.   Musculoskeletal:  Positive for arthralgias (3-5 toes left foot). Negative for gait problem and joint swelling.   Skin:  Negative for rash.   Neurological:  Negative for dizziness and headaches.   Hematological:  Negative for adenopathy.   Psychiatric/Behavioral:  Negative for sleep disturbance.          Objective:      Physical Exam  Vitals and nursing note reviewed.   Constitutional:       Appearance: Normal appearance. He is not ill-appearing or diaphoretic.   HENT:      Head: Normocephalic and atraumatic.   Eyes:      Conjunctiva/sclera: Conjunctivae normal.   Pulmonary:      Effort: Pulmonary effort is normal.   Musculoskeletal:      Cervical back: Neck supple.   Skin:     General: Skin is warm and dry.      Coloration: Skin is not pale.      Findings: No erythema or rash.      Comments: Mild callus on both 4-5th toes, no redness, swelling or warmth, normal ROM all toes/foot, normal toenails without deformity.    Neurological:      General: No focal deficit present.      Mental Status: He is alert and oriented to person, place, and time. Mental status is at baseline.   Psychiatric:         Mood and Affect: Mood normal.         Behavior: Behavior normal.         Thought Content: Thought content normal.         Judgment: Judgment normal.         Assessment:       Diagnosis Orders   1. Pain in left toe(s)  XR FOOT LEFT (MIN 3 VIEWS)      2. Hypothyroidism, unspecified type        3. Lipid screening  4. Diabetes mellitus screening        5. Elevated random blood glucose level  POCT glycosylated hemoglobin (Hb A1C)        Lab Results   Component Value Date    TSH 3.11 01/24/2023     Lab Results   Component Value Date    WBC 6.5 01/24/2023    HGB 15.0 01/24/2023    HCT 46.2 01/24/2023    MCV 94.7 01/24/2023    PLT 303 01/24/2023     Lab Results   Component Value Date     URICACID 6.2 01/24/2023     Results for orders placed or performed in visit on 01/28/23   POCT glycosylated hemoglobin (Hb A1C)   Result Value Ref Range    Hemoglobin A1C 5.6 %       Plan:      Return if symptoms worsen or fail to improve.  Orders Placed This Encounter   Procedures    XR FOOT LEFT (MIN 3 VIEWS)     Standing Status:   Future     Number of Occurrences:   1     Standing Expiration Date:   01/28/2024     Order Specific Question:   Reason for exam:     Answer:   pain x 1 month    POCT glycosylated hemoglobin (Hb A1C)     Check xray of foot to eval for OA or other changes to cause his symptoms  Ok for tylenol (unable to take nsaids) and ice/heat and continue with supportive shoes  May need referral to podiatry      Thyroid stable, continue same dose    Patient given educational materials - see patient instructions.  Discussed use,benefit, and side effects of prescribed medications.  All patient questions answered.Pt voiced understanding. Reviewed health maintenance.  Instructed to continue currentmedications, diet and exercise.    Electronically signed by Kris Mouton, APRN - CNP,CNP on 01/28/2023 at 12:28 PM

## 2023-05-28 DIAGNOSIS — M1712 Unilateral primary osteoarthritis, left knee: Secondary | ICD-10-CM | POA: Diagnosis not present

## 2023-06-04 DIAGNOSIS — H401131 Primary open-angle glaucoma, bilateral, mild stage: Secondary | ICD-10-CM | POA: Diagnosis not present

## 2023-07-18 DIAGNOSIS — M1712 Unilateral primary osteoarthritis, left knee: Secondary | ICD-10-CM | POA: Diagnosis not present

## 2023-07-25 DIAGNOSIS — I10 Essential (primary) hypertension: Secondary | ICD-10-CM | POA: Diagnosis not present

## 2023-07-25 DIAGNOSIS — R7309 Other abnormal glucose: Secondary | ICD-10-CM | POA: Diagnosis not present

## 2023-07-25 DIAGNOSIS — J439 Emphysema, unspecified: Secondary | ICD-10-CM | POA: Diagnosis not present

## 2023-09-04 NOTE — Telephone Encounter (Signed)
 Called the patient to schedule his AWV. Patient stated he was at work right now. He asked that I call him after 4pm. Patient said he will try to stop by the office and schedule his appointment after work- doesn't mind because its on his way home.     I advised him I will call if I noticed he does not make it in.

## 2023-09-10 DIAGNOSIS — M1712 Unilateral primary osteoarthritis, left knee: Secondary | ICD-10-CM | POA: Diagnosis not present

## 2023-10-29 ENCOUNTER — Other Ambulatory Visit: Payer: Self-pay | Admitting: Cardiovascular Disease

## 2023-11-14 ENCOUNTER — Ambulatory Visit: Admit: 2023-11-14 | Discharge: 2023-11-14 | Payer: Medicare (Managed Care) | Attending: Family | Primary: Family

## 2023-11-14 VITALS — BP 120/68 | HR 85 | Temp 97.20000°F | Ht 73.0 in | Wt 221.0 lb

## 2023-11-14 DIAGNOSIS — Z0000 Encounter for general adult medical examination without abnormal findings: Principal | ICD-10-CM

## 2023-11-14 NOTE — Patient Instructions (Signed)
 Learning About Stress  What is stress?     Stress is your body's response to a hard situation. Your body can have a physical, emotional, or mental response. Stress is a fact of life for most people, and it affects everyone differently. What causes stress for you may not be stressful for someone else.  A lot of things can cause stress. You may feel stress when you go on a job interview, take a test, or run a race. This kind of short-term stress is normal and even useful. It can help you if you need to work hard or react quickly. For example, stress can help you finish an important job on time.  Long-term stress is caused by ongoing stressful situations or events. Examples of long-term stress include long-term health problems, ongoing problems at work, or conflicts in your family. Long-term stress can harm your health.  How does stress affect your health?  When you are stressed, your body responds as though you are in danger. It makes hormones that speed up your heart, make you breathe faster, and give you a burst of energy. This is called the fight-or-flight stress response. If the stress is over quickly, your body goes back to normal and no harm is done.  But if stress happens too often or lasts too long, it can have bad effects. Long-term stress can make you more likely to get sick, and it can make symptoms of some diseases worse. If you tense up when you are stressed, you may develop neck, shoulder, or low back pain. Stress is linked to high blood pressure and heart disease.  Stress also harms your emotional health. It can make you moody, tense, or depressed. Your relationships may suffer, and you may not do well at work or school.  What can you do to manage stress?  You can try these things to help manage stress:   Do something active. Exercise or activity can help reduce stress. Walking is a great way to get started. Even everyday activities such as housecleaning or yard work can help.  Try yoga or tai chi.  These techniques combine exercise and meditation. You may need some training at first to learn them.  Do something you enjoy. For example, listen to music or go to a movie. Practice your hobby or do volunteer work.  Meditate. This can help you relax, because you are not worrying about what happened before or what may happen in the future.  Do guided imagery. Imagine yourself in any setting that helps you feel calm. You can use online videos, books, or a teacher to guide you.  Do breathing exercises. For example:  From a standing position, bend forward from the waist with your knees slightly bent. Let your arms dangle close to the floor.  Breathe in slowly and deeply as you return to a standing position. Roll up slowly and lift your head last.  Hold your breath for just a few seconds in the standing position.  Breathe out slowly and bend forward from the waist.  Let your feelings out. Talk, laugh, cry, and express anger when you need to. Talking with supportive friends or family, a Veterinary surgeon, or a faith leader about your feelings is a healthy way to relieve stress. Avoid discussing your feelings with people who make you feel worse.  Write. It may help to write about things that are bothering you. This helps you find out how much stress you feel and what is causing it. When you  know this, you can find better ways to cope.  What can you do to prevent stress?  You might try some of these things to help prevent stress:  Manage your time. This helps you find time to do the things you want and need to do.  Get enough sleep. Your body recovers from the stresses of the day while you are sleeping.  Get support. Your family, friends, and community can make a difference in how you experience stress.  Limit your news feed. Avoid or limit time on social media or news that may make you feel stressed.  Do something active. Exercise or activity can help reduce stress. Walking is a great way to get started.  Where can you learn  more?  Go to [employer] and enter N032 to learn more about Learning About Stress.  Current as of: January 17, 2023  Content Version: 14.5   [address], [place].   Care instructions adapted under license by [facility]. If you have questions about a medical condition or this instruction, always ask your healthcare professional. Romayne Alderman, [facility], disclaims any warranty or liability for your use of this information.         A Healthy Heart: Care Instructions  Overview     Coronary artery disease, also called heart disease, occurs when a substance called plaque builds up in the vessels that supply oxygen-rich blood to your heart muscle. This can narrow the blood vessels and reduce blood flow. A heart attack happens when blood flow is completely blocked. A high-fat diet, smoking, and other factors increase the risk of heart disease.  Your doctor has found that you have a chance of having heart disease. A heart-healthy lifestyle can help keep your heart healthy and prevent heart disease. This lifestyle includes eating healthy, being active, staying at a weight that's healthy for you, and not smoking or using tobacco. It also includes taking medicines as directed, managing other health conditions, and trying to get a healthy amount of sleep.  Follow-up care is a key part of your treatment and safety. Be sure to make and go to all appointments, and call your doctor if you are having problems. It's also a good idea to know your test results and keep a list of the medicines you take.  How can you care for yourself at home?  Diet    Use less salt when you cook and eat. This helps lower your blood pressure. Taste food before salting. Add only a little salt when you think you need it. With time, your taste buds will adjust to less salt.     Eat fewer snack items, fast foods, canned soups, and other high-salt, high-fat, processed foods.     Read food labels and try to avoid  saturated and trans fats. They increase your risk of heart disease by raising cholesterol levels.     Limit the amount of solid fat--butter, margarine, and shortening--you eat. Use olive, peanut, or canola oil when you cook. Bake, broil, and steam foods instead of frying them.     Eat a variety of fruit and vegetables every day. Dark green, deep orange, red, or yellow fruits and vegetables are especially good for you. Examples include spinach, carrots, peaches, and berries.     Foods high in fiber can reduce your cholesterol and provide important vitamins and minerals. High-fiber foods include whole-grain cereals and breads, oatmeal, beans, brown rice, citrus fruits, and apples.     Eat lean proteins. Heart-healthy  proteins include seafood, lean meats and poultry, eggs, beans, peas, nuts, seeds, and soy products.     Limit drinks and foods with added sugar. These include candy, desserts, and soda pop.   Heart-healthy lifestyle    If your doctor recommends it, get more exercise. For many people, walking is a good choice. Or you may want to swim, bike, or do other activities. Bit by bit, increase the time you're active every day. Try for at least 30 minutes on most days of the week.     Try to quit or cut back on using tobacco and other nicotine products. This includes smoking and vaping. If you need help quitting, talk to your doctor about stop-smoking programs and medicines. These can increase your chances of quitting for good. Quitting is one of the most important things you can do to protect your heart. It is never too late to quit. Try to avoid secondhand smoke too.     Stay at a weight that's healthy for you. Talk to your doctor if you need help losing weight.     Try to get 7 to 9 hours of sleep each night.     Limit alcohol to 2 drinks a day for men and 1 drink a day for women. Too much alcohol can cause health problems.     Manage other health problems such as diabetes, high blood pressure, and high  cholesterol. If you think you may have a problem with alcohol or drug use, talk to your doctor.   Medicines    Take your medicines exactly as prescribed. Call your doctor if you think you are having a problem with your medicine.     If your doctor recommends aspirin, take the amount directed each day. Make sure you take aspirin and not another kind of pain reliever, such as acetaminophen (Tylenol).   When should you call for help?   Call 911 if you have symptoms of a heart attack. These may include:    Chest pain or pressure, or a strange feeling in the chest.     Sweating.     Shortness of breath.     Pain, pressure, or a strange feeling in the back, neck, jaw, or upper belly or in one or both shoulders or arms.     Lightheadedness or sudden weakness.     A fast or irregular heartbeat.   After you call 911, the operator may tell you to chew 1 adult-strength or 2 to 4 low-dose aspirin. Wait for an ambulance. Do not try to drive yourself.  Watch closely for changes in your health, and be sure to contact your doctor if you have any problems.  Where can you learn more?  Go to [employer] and enter F075 to learn more about A Healthy Heart: Care Instructions.  Current as of: October 24, 2022  Content Version: 14.5   [address] [place].   Care instructions adapted under license by [facility]. If you have questions about a medical condition or this instruction, always ask your healthcare professional. Romayne Alderman, [facility], disclaims any warranty or liability for your use of this information.    Personalized Preventive Plan for Dustin Zimmerman - 11/14/2023  Medicare offers a range of preventive health benefits. Some of the tests and screenings are paid in full while other may be subject to a deductible, co-insurance, and/or copay.  Some of these benefits include a comprehensive review of your medical history including lifestyle, illnesses that may  run in your family, and various  assessments and screenings as appropriate.  After reviewing your medical record and screening and assessments performed today your provider may have ordered immunizations, labs, imaging, and/or referrals for you.  A list of these orders (if applicable) as well as your Preventive Care list are included within your After Visit Summary for your review.

## 2023-11-14 NOTE — Progress Notes (Signed)
 Visit Information    Have you changed or started any medications since your last visit including any over-the-counter medicines, vitamins, or herbal medicines? no   Have you stopped taking any of your medications? Is so, why? -  no  Are you having any side effects from any of your medications? - no    Have you seen any other physician or provider since your last visit?  no   Have you had any other diagnostic tests since your last visit?  no   Have you been seen in the emergency room and/or had an admission in a hospital since we last saw you?  no   Have you had your routine dental cleaning in the past 6 months?  no     Do you have an active MyChart account? If no, what is the barrier?  Yes    Patient Care Team:  Donna Burnard SAILOR, APRN - CNP as PCP - General (Nurse Practitioner)  Donna Burnard SAILOR, APRN - CNP as PCP - Empaneled Provider  Daiber, Lamar SAUNDERS, MD    Medical History Review  Past Medical, Family, and Social History reviewed and  contribute to the patient presenting condition    Health Maintenance   Topic Date Due    Shingles vaccine (1 of 2) Never done    COVID-19 Vaccine (1 - 2024-25 season) Never done    Annual Wellness Visit (Medicare Advantage)  03/27/2023    Depression Screen  08/04/2023    Flu vaccine (1) Never done    Respiratory Syncytial Virus (RSV) Pregnant or age 23 yrs+ (1 - 1-dose 75+ series) 12/27/2024    Colorectal Cancer Screen  04/20/2025    DTaP/Tdap/Td vaccine (3 - Td or Tdap) 06/14/2026    Lipids  08/07/2027    Pneumococcal 50+ years Vaccine  Completed    AAA screen  Completed    Hepatitis C screen  Addressed    Hepatitis A vaccine  Aged Out    Hepatitis B vaccine  Aged Out    Hib vaccine  Aged Out    Polio vaccine  Aged Out    Meningococcal (ACWY) vaccine  Aged Out    Meningococcal B vaccine  Aged Out    A1C test (Diabetic or Prediabetic)  Discontinued

## 2023-11-14 NOTE — Progress Notes (Signed)
 Medicare Annual Wellness Visit    Dustin Zimmerman is here for Medicare AWV and Foot Pain (Left, pinky toe about 8 months ago, saw foot dr, advised surgery but he declined.  Has been using spacers and toes feeling better.  In last 3 months he has been feeling burning in feet, asking for referral to dr romie)    Assessment & Plan   Medicare annual wellness visit, subsequent  Overall he is doing well  Patient advised to follow a low refined carb, low sugar diet with healthy fats such as avocados, variety of nuts,extra virgin olive oil and omega 3's.  Recommend 150 minutes of cardiovascular exercise per week as able and tolerated.   Colonoscopy due 2027  Update routine labs  He continues with cardio, urology, GI and derm routinely  Ok for shingles vaccines at pharmacy    Screening for lipoid disorders  -     Lipid Panel; Future  Lipid screening  Diabetes mellitus screening  Special screening for malignant neoplasm of prostate  -     PSA Screening; Future  Pain in left toe(s)  Referral given at pt request    -     [facility]FL - romie Sharper, DPM, Podiatry, [facility]  Hypothyroidism, unspecified type  Controlled?  Update labs and continue levothyroxine  75 mcg daily    -     TSH; Future  -     T4, Free; Future  Medication monitoring encounter  -     Comprehensive Metabolic Panel; Future  -     CBC with Auto Differential; Future    Wt Readings from Last 3 Encounters:   11/14/23 100.2 kg (221 lb)   01/28/23 96.7 kg (213 lb 3.2 oz)   08/06/22 95.2 kg (209 lb 12.8 oz)     BP Readings from Last 3 Encounters:   11/14/23 120/68   01/28/23 132/76   08/06/22 128/64          No follow-ups on file.     Subjective   73 year old male here today for Medicare wellness visit.  Is requesting referral to podiatry for ongoing left toe and foot pad pain.  States he has tried different shoe inserts and toe spacers with minimal improvement and looking for a new opinion.    Patient's complete Health Risk Assessment and screening values have been  reviewed and are found in Flowsheets. The following problems were reviewed today and where indicated follow up appointments were made and/or referrals ordered.    Positive Risk Factor Screenings with Interventions:             General HRA Questions:  Select all that apply: (!) StressInterventions - Stress:  Patient comments: daily stress  Patient declined any further interventions or treatment            Safety:  Do you have non-slip mats or non-slip surfaces or shower bars or grab bars in your shower or bathtub?: (!) No  Interventions:  Patient declined any further interventions or treatment                    Objective   Vitals:    11/14/23 1404   BP: 120/68   BP Site: Right Upper Arm   Patient Position: Sitting   BP Cuff Size: Medium Adult   Pulse: 85   Temp: 97.2 F (36.2 C)   TempSrc: Tympanic   SpO2: 96%   Weight: 100.2 kg (221 lb)   Height: 1.854 m (6' 1)  Body mass index is 29.16 kg/m.      General Appearance: alert and oriented to person, place and time, well-developed and well-nourished, in no acute distress  Pulmonary/Chest: clear to auscultation bilaterally- no wheezes, rales or rhonchi, normal air movement, no respiratory distress  Cardiovascular: normal rate, normal S1 and S2, no gallops, intact distal pulses, no carotid bruits, and systolic murmur present- 3/6 at 2nd left intercostal space  Extremities: no cyanosis, no clubbing, and no edema  Neurologic: gait and coordination normal, speech normal, and no tremor            Allergies   Allergen Reactions    Motrin [Ibuprofen] Other (See Comments)     kidney    Percocet [Oxycodone-Acetaminophen] Hives    Motrin [Ibuprofen] Nausea And Vomiting     kidney    Percocet [Oxycodone-Acetaminophen] Rash     Prior to Visit Medications   Medication Sig Taking? Authorizing Provider   timolol  (TIMOPTIC ) 0.25 % ophthalmic solution  Yes [provider]   fluorometholone (FML) 0.1 % ophthalmic suspension  Yes [provider]   levothyroxine   (SYNTHROID ) 75 MCG tablet Take 1 tablet by mouth daily Yes Donna Burnard SAILOR, APRN - CNP   mupirocin  (BACTROBAN ) 2 % ointment Apply topically 3 times daily. Yes Donna Burnard SAILOR, APRN - CNP   pantoprazole  (PROTONIX ) 40 MG tablet  Yes [provider]   omeprazole  (PRILOSEC) 40 MG delayed release capsule TAKE 1 CAPSULE BY MOUTH EVERY MORNING BEFORE BREAKFAST Yes Donna Burnard SAILOR, APRN - CNP   nystatin  (MYCOSTATIN ) 100000 UNIT/GM ointment Apply topically 2 times daily.  Patient not taking: Reported on 11/14/2023  Donna Burnard SAILOR, APRN - CNP       CareTeam (Including outside providers/suppliers regularly involved in providing care):   Patient Care Team:  Donna Burnard SAILOR, APRN - CNP as PCP - General (Nurse Practitioner)  Donna Burnard SAILOR, APRN - CNP as PCP - Empaneled Provider  Daiber, Lamar SAUNDERS, MD     Recommendations for Preventive Services Due: see orders and patient instructions/AVS.  Recommended screening schedule for the next 5-10 years is provided to the patient in written form: see Patient Instructions/AVS.     Reviewed and updated this visit:  Tobacco  Allergies  Meds  Problems  Med Hx  Surg Hx  Fam Hx

## 2023-11-19 DIAGNOSIS — M1712 Unilateral primary osteoarthritis, left knee: Secondary | ICD-10-CM | POA: Diagnosis not present

## 2023-11-26 ENCOUNTER — Other Ambulatory Visit: Payer: Self-pay | Admitting: Cardiovascular Disease

## 2023-11-26 DIAGNOSIS — M1712 Unilateral primary osteoarthritis, left knee: Secondary | ICD-10-CM | POA: Diagnosis not present

## 2023-12-03 DIAGNOSIS — M1712 Unilateral primary osteoarthritis, left knee: Secondary | ICD-10-CM | POA: Diagnosis not present

## 2023-12-03 MED ORDER — LEVOTHYROXINE SODIUM 75 MCG PO TABS
75 | ORAL_TABLET | Freq: Every day | ORAL | 3 refills | Status: AC
Start: 2023-12-03 — End: ?

## 2023-12-04 DIAGNOSIS — Z0189 Encounter for other specified special examinations: Secondary | ICD-10-CM | POA: Diagnosis not present

## 2023-12-04 DIAGNOSIS — E785 Hyperlipidemia, unspecified: Secondary | ICD-10-CM | POA: Diagnosis not present

## 2023-12-04 DIAGNOSIS — Z125 Encounter for screening for malignant neoplasm of prostate: Secondary | ICD-10-CM | POA: Diagnosis not present

## 2023-12-04 DIAGNOSIS — R7309 Other abnormal glucose: Secondary | ICD-10-CM | POA: Diagnosis not present

## 2023-12-04 DIAGNOSIS — Z1212 Encounter for screening for malignant neoplasm of rectum: Secondary | ICD-10-CM | POA: Diagnosis not present

## 2023-12-05 ENCOUNTER — Inpatient Hospital Stay: Payer: Medicare (Managed Care) | Primary: Family

## 2023-12-05 DIAGNOSIS — Z13220 Encounter for screening for lipoid disorders: Principal | ICD-10-CM

## 2023-12-05 LAB — COMPREHENSIVE METABOLIC PANEL
ALT: 45 U/L (ref 10–50)
AST: 33 U/L (ref 10–50)
Albumin/Globulin Ratio: 1.7 (ref 1.0–2.5)
Albumin: 4.6 g/dL (ref 3.5–5.2)
Alkaline Phosphatase: 76 U/L (ref 40–129)
Anion Gap: 10 mmol/L (ref 9–16)
BUN: 20 mg/dL (ref 8–23)
CO2: 30 mmol/L (ref 20–31)
Calcium: 9.6 mg/dL (ref 8.6–10.4)
Chloride: 100 mmol/L (ref 98–107)
Creatinine: 1 mg/dL (ref 0.7–1.2)
Est, Glom Filt Rate: 79 mL/min/1.73m2 (ref >60)
Glucose: 102 mg/dL — ABNORMAL HIGH (ref 74–99)
Potassium: 4.9 mmol/L (ref 3.7–5.3)
Sodium: 140 mmol/L (ref 136–145)
Total Bilirubin: 0.4 mg/dL (ref 0.0–1.2)
Total Protein: 7.3 g/dL (ref 6.6–8.7)

## 2023-12-05 LAB — CBC WITH AUTO DIFFERENTIAL
Basophils %: 1 % (ref 0–2)
Basophils Absolute: 0.03 k/uL (ref 0.00–0.20)
Eosinophils %: 5 % — ABNORMAL HIGH (ref 1–4)
Eosinophils Absolute: 0.32 k/uL (ref 0.00–0.44)
Hematocrit: 45.9 % (ref 40.7–50.3)
Hemoglobin: 15.2 g/dL (ref 13.0–17.0)
Immature Granulocytes %: 0 %
Immature Granulocytes Absolute: <0.03 k/uL (ref 0.00–0.30)
Lymphocytes %: 31 % (ref 24–43)
Lymphocytes Absolute: 1.92 k/uL (ref 1.10–3.70)
MCH: 30.5 pg (ref 25.2–33.5)
MCHC: 33.1 g/dL (ref 28.4–34.8)
MCV: 92 fL (ref 82.6–102.9)
MPV: 10.4 fL (ref 8.1–13.5)
Monocytes %: 11 % (ref 3–12)
Monocytes Absolute: 0.66 k/uL (ref 0.10–1.20)
NRBC Automated: 0 /100{WBCs}
Neutrophils %: 52 % (ref 36–65)
Neutrophils Absolute: 3.27 k/uL (ref 1.50–8.10)
Platelets: 317 k/uL (ref 138–453)
RBC: 4.99 m/uL (ref 4.21–5.77)
RDW: 13.7 % (ref 11.8–14.4)
WBC: 6.2 k/uL (ref 3.5–11.3)

## 2023-12-05 LAB — LIPID PANEL
Chol/HDL Ratio: 5.4 — ABNORMAL HIGH (ref <5.0)
Cholesterol, Total: 272 mg/dL — ABNORMAL HIGH (ref 0–199)
HDL: 50 mg/dL (ref >40)
LDL Cholesterol: 199 mg/dL — ABNORMAL HIGH (ref 0–100)
Triglycerides: 117 mg/dL (ref <150)
VLDL: 23 mg/dL (ref 1–30)

## 2023-12-05 LAB — PSA SCREENING: PSA: 6.08 ng/mL — ABNORMAL HIGH (ref 0.00–4.00)

## 2023-12-05 LAB — T4, FREE: T4 Free: 1.3 ng/dL (ref 0.9–1.7)

## 2023-12-05 LAB — TSH: TSH: 3.79 u[IU]/mL (ref 0.27–4.20)

## 2023-12-09 ENCOUNTER — Telehealth: Payer: Self-pay

## 2023-12-09 DIAGNOSIS — H401131 Primary open-angle glaucoma, bilateral, mild stage: Secondary | ICD-10-CM | POA: Diagnosis not present

## 2023-12-09 NOTE — Telephone Encounter (Signed)
 Received clearance from [facility].  Before the preop clearance request was entered and [facility] stating to disregard the preop clearance request. Stated that the surgeon changes from Dr. Corbin to Dr Regenia. Stated that Dr. Regenia does not need the clearance for Dr Regenia does not require Aspirin  to be held for the procedure  Cataract extraction with Goniotomy Inder IV sedation.

## 2023-12-10 NOTE — Telephone Encounter (Signed)
 Preop clearance appt now scheduled

## 2023-12-10 NOTE — Telephone Encounter (Signed)
   Pre-operative Risk Assessment    Patient Name: Malik [name]  DOB: [birth year] MRN: [id number]   Date of last office visit: 10/29/22 Date of next office visit: 01/17/24   Request for Surgical Clearance    Procedure:  Cataract extraction with Goniotomy   Date of Surgery:  Clearance 12/19/23  and 01/02/24                       Surgeon:  Dr. Prentice JINNY Mandes Surgeon's Group or Practice Name:  [facility] Phone number:  [phone] Fax number:  [phone]   Type of Clearance Requested:   - Medical  - Pharmacy:  Hold Aspirin  x7 days prior   Type of Anesthesia:  IV Sedation   Additional requests/questions:  Same procedure on 12/19/23 and 01/02/24  Signed, Ival LOISE Collet   12/10/2023, 11:13 AM

## 2023-12-10 NOTE — Telephone Encounter (Signed)
   Name: Malik [name]  DOB: 1950-04-19  MRN: [id number]  Primary Cardiologist: Maude Emmer, MD  Chart reviewed as part of pre-operative protocol coverage. Because of Malik [name] past medical history and time since last visit, he will require a follow-up in-office visit in order to better assess preoperative cardiovascular risk. Patient has not been seen in > 1 year.  Pre-op covering staff: - Please schedule appointment and call patient to inform them. If patient already had an upcoming appointment within acceptable timeframe, please add pre-op clearance to the appointment notes so provider is aware. - Please contact requesting surgeon's office via preferred method (i.e, phone, fax) to inform them of need for appointment prior to surgery.  Regarding ASA therapy, we recommend continuation of ASA throughout the perioperative period.  However, if the surgeon feels that cessation of ASA is required in the perioperative period, it may be stopped 5-7 days prior to surgery with a plan to resume it as soon as felt to be feasible from a surgical standpoint in the post-operative period.   Clara Smolen E Erasmus Bistline, NP  12/10/2023, 11:39 AM

## 2023-12-11 DIAGNOSIS — Z1331 Encounter for screening for depression: Secondary | ICD-10-CM | POA: Diagnosis not present

## 2023-12-11 DIAGNOSIS — I119 Hypertensive heart disease without heart failure: Secondary | ICD-10-CM | POA: Diagnosis not present

## 2023-12-11 DIAGNOSIS — R82998 Other abnormal findings in urine: Secondary | ICD-10-CM | POA: Diagnosis not present

## 2023-12-11 DIAGNOSIS — Z1339 Encounter for screening examination for other mental health and behavioral disorders: Secondary | ICD-10-CM | POA: Diagnosis not present

## 2023-12-11 DIAGNOSIS — Z0000 Encounter for general adult medical examination without abnormal findings: Secondary | ICD-10-CM | POA: Diagnosis not present

## 2023-12-13 ENCOUNTER — Encounter (HOSPITAL_BASED_OUTPATIENT_CLINIC_OR_DEPARTMENT_OTHER): Payer: Self-pay | Admitting: Family

## 2023-12-13 ENCOUNTER — Ambulatory Visit: Attending: Family

## 2023-12-13 ENCOUNTER — Ambulatory Visit (HOSPITAL_BASED_OUTPATIENT_CLINIC_OR_DEPARTMENT_OTHER): Admitting: Family

## 2023-12-13 VITALS — BP 130/50 | HR 76 | Ht 72.0 in | Wt 302.0 lb

## 2023-12-13 DIAGNOSIS — I493 Ventricular premature depolarization: Secondary | ICD-10-CM

## 2023-12-13 DIAGNOSIS — E785 Hyperlipidemia, unspecified: Secondary | ICD-10-CM | POA: Diagnosis not present

## 2023-12-13 DIAGNOSIS — Z01818 Encounter for other preprocedural examination: Secondary | ICD-10-CM

## 2023-12-13 DIAGNOSIS — I25118 Atherosclerotic heart disease of native coronary artery with other forms of angina pectoris: Secondary | ICD-10-CM

## 2023-12-13 MED ORDER — METOPROLOL SUCCINATE ER 50 MG PO TB24: 50.0000 mg | ORAL_TABLET | Freq: Every day | ORAL | 3 refills | Status: AC

## 2023-12-13 MED ORDER — ASPIRIN 81 MG PO TBEC: 81.0000 mg | DELAYED_RELEASE_TABLET | Freq: Every day | ORAL | Status: AC

## 2023-12-13 NOTE — Progress Notes (Signed)
 Cardiology Office Note   Date:  12/13/2023  ID:  Malik [name], Malik [name] 12/21/1950, MRN [id number] PCP: Nichole Senior, MD  [facility] Providers Cardiologist:  Maude Emmer, MD     History of Present Illness Malik [name] is a 73 y.o. male with history of CAD s/p CABG X1 and AVR with bioprosthetic valve, HTN, HLD, pulmonary nodule, PVC, GERD.  Previous intolerance to carvedilol  with headache.  ARB and diuretic added for BP control.  Underwent AVR 12/2021 with 27 mm Inspiris Resilia valve with subsequent TTE 02/2022 with mean gradient 7 mmHg and normal LVEF.  He had single SVG-RCA at the time of his AVR.  He was last seen 10/2022 with no anginal symptoms.  No changes were made and he was recommended to follow-up in 1 year.  Presents today for preop clearance for cataract extraction with goniotomy. Request to hold Aspirin  7 days prior. Reports no shortness of breath nor dyspnea on exertion. Reports no chest pain, pressure, or tightness. No edema, orthopnea, PND. Reports no palpitations.  No formal exercise routine though does yard work, recently putting out mulch with a shovel. Takes his Toprol  and Valsartan -hydrochlorothiazide  every evening. At home he drinks tea, discussed diuretic effect. Typically gest up 4x per night to urinate.   Labs via [facility]: Hb 15.5, K 4.8 TC 112, TG 80, HDL 31, LDL 65  ROS: Please see the history of present illness.    All other systems reviewed and are negative.   Studies Reviewed      Cardiac Studies & Procedures   ______________________________________________________________________________________________ CARDIAC CATHETERIZATION  CARDIAC CATHETERIZATION 11/29/2021  Conclusion   Ost RCA to Prox RCA lesion is 80% stenosed.   Prox LAD to Mid LAD lesion is 30% stenosed.   Prox Cx to Mid Cx lesion is 70% stenosed.   There is severe aortic valve stenosis.  1.  Severe single-vessel coronary artery disease with severe ostial stenosis of the RCA and  left-to-right collaterals supplying the distal RCA branches which fill both antegrade and via collaterals 2.  Patent left main and LAD without significant stenosis 3.  Patent left circumflex into a large first OM and moderately severe stenosis of the AV circumflex beyond the OM supplying a small myocardial territory 4.  Severe calcific aortic stenosis with mean transvalvular gradient 50 mmHg, peak to peak gradient 61 mmHg, calculated valve area 0.89 cm 5.  Normal right heart hemodynamics  Recommendations: Favor medical therapy for CAD, CT angiography studies for planning of aortic valve replacement via TAVR versus SAVR  Findings Coronary Findings Diagnostic  Dominance: Right  Left Anterior Descending The LAD courses to the apex and supplies collaterals to the RCA.  The vessel has mild nonobstructive disease.  There is no significant stenosis in the LAD or its diagonal branches. Prox LAD to Mid LAD lesion is 30% stenosed.  Left Circumflex The circumflex is patent into a large obtuse marginal.  Just beyond the marginal origin, there is a moderately severe stenosis but a small myocardial territory beyond the lesion. Prox Cx to Mid Cx lesion is 70% stenosed.  Right Coronary Artery Ost RCA to Prox RCA lesion is 80% stenosed. The lesion is calcified. Lesion difficult to visualize and may be more severe.  There is a well-formed left-to-right collateral and to and fro flow seen on RCA imaging.  There is no high-grade stenosis throughout the mid or distal portion of the RCA.  The only visible lesion is at the ostium.  Right Posterior Descending Artery  Collaterals RPDA filled by collaterals from 1st Sept.  First Right Posterolateral Branch Collaterals 1st RPL filled by collaterals from 2nd Sept.  Intervention  No interventions have been documented.     ECHOCARDIOGRAM  ECHOCARDIOGRAM COMPLETE 03/07/2022  Narrative ECHOCARDIOGRAM REPORT    Patient Name:   Malik [name] Date of  Exam: 03/07/2022 Medical Rec #:  [id number]        Height:       72.0 in Accession #:    [id number]       Weight:       298.0 lb Date of Birth:  15-Jun-1950        BSA:          2.524 m Patient Age:    71 years         BP:           144/76 mmHg Patient Gender: M                HR:           59 bpm. Exam Location:  [address]  Procedure: 2D Echo, Cardiac Doppler, Color Doppler and Intracardiac Opacification Agent  Indications:    I35.0 Nonrheumatic aortic (valve) stenosis  History:        Patient has prior history of Echocardiogram examinations, most recent 01/23/2022. Signs/Symptoms:Murmur and Dyspnea; Risk Factors:Hypertension and Dyslipidemia. Aortic valve replacement (27mm Inspiris Resilia). Obesity. Hyperglycemia.  Sonographer:    Jon Hacker RCS Referring Phys: MANUELITA KATHEE RUMMER  IMPRESSIONS   1. Left ventricular ejection fraction, by estimation, is 60 to 65%. The left ventricle has normal function. The left ventricle has no regional wall motion abnormalities. Left ventricular diastolic parameters were normal. 2. Right ventricular systolic function is normal. The right ventricular size is normal. 3. Right atrial size was mild to moderately dilated. 4. The mitral valve is normal in structure. No evidence of mitral valve regurgitation. No evidence of mitral stenosis. 5. The aortic valve has been repaired/replaced. There is a 27 mm Inspiris Resilia valve present in the aortic position. No aortic stenosis is present. Aortic valve area, by VTI measures 3.79 cm. Aortic valve mean gradient measures 7.0 mmHg. Aortic valve Vmax measures 1.87 m/s. 6. Aortic dilatation noted. There is mild dilatation of the ascending aorta, measuring 41 mm. 7. The inferior vena cava is normal in size with greater than 50% respiratory variability, suggesting right atrial pressure of 3 mmHg. 8. Compared to echo dated 10/2021 there is now an AVR present functioning normally.  FINDINGS Left Ventricle:  Left ventricular ejection fraction, by estimation, is 60 to 65%. The left ventricle has normal function. The left ventricle has no regional wall motion abnormalities. The left ventricular internal cavity size was normal in size. There is no left ventricular hypertrophy. Left ventricular diastolic parameters were normal. Normal left ventricular filling pressure.  Right Ventricle: The right ventricular size is normal. No increase in right ventricular wall thickness. Right ventricular systolic function is normal.  Left Atrium: Left atrial size was normal in size.  Right Atrium: Right atrial size was mild to moderately dilated.  Pericardium: There is no evidence of pericardial effusion.  Mitral Valve: The mitral valve is normal in structure. No evidence of mitral valve regurgitation. No evidence of mitral valve stenosis.  Tricuspid Valve: The tricuspid valve is normal in structure. Tricuspid valve regurgitation is not demonstrated. No evidence of tricuspid stenosis.  Aortic Valve: The aortic valve has been repaired/replaced. Aortic valve regurgitation is not visualized. No aortic  stenosis is present. Aortic valve mean gradient measures 7.0 mmHg. Aortic valve peak gradient measures 14.0 mmHg. Aortic valve area, by VTI measures 3.79 cm. There is a 27 mm Inspiris Resilia valve present in the aortic position.  Pulmonic Valve: The pulmonic valve was normal in structure. Pulmonic valve regurgitation is not visualized. No evidence of pulmonic stenosis.  Aorta: Aortic dilatation noted. There is mild dilatation of the ascending aorta, measuring 41 mm.  Venous: The inferior vena cava is normal in size with greater than 50% respiratory variability, suggesting right atrial pressure of 3 mmHg.  IAS/Shunts: No atrial level shunt detected by color flow Doppler.   LEFT VENTRICLE PLAX 2D LVIDd:         5.20 cm   Diastology LVIDs:         3.60 cm   LV e' medial:    10.70 cm/s LV PW:         1.20 cm   LV  E/e' medial:  8.3 LV IVS:        0.80 cm   LV e' lateral:   11.00 cm/s LVOT diam:     2.70 cm   LV E/e' lateral: 8.1 LV SV:         164 LV SV Index:   65 LVOT Area:     5.73 cm   RIGHT VENTRICLE RV Basal diam:  2.80 cm RV S prime:     11.00 cm/s TAPSE (M-mode): 1.6 cm  LEFT ATRIUM             Index        RIGHT ATRIUM           Index LA diam:        5.20 cm 2.06 cm/m   RA Area:     26.80 cm LA Vol (A2C):   56.2 ml 22.27 ml/m  RA Volume:   96.40 ml  38.19 ml/m LA Vol (A4C):   87.4 ml 34.63 ml/m LA Biplane Vol: 74.3 ml 29.44 ml/m AORTIC VALVE AV Area (Vmax):    3.58 cm AV Area (Vmean):   3.26 cm AV Area (VTI):     3.79 cm AV Vmax:           187.00 cm/s AV Vmean:          121.000 cm/s AV VTI:            0.433 m AV Peak Grad:      14.0 mmHg AV Mean Grad:      7.0 mmHg LVOT Vmax:         117.00 cm/s LVOT Vmean:        68.800 cm/s LVOT VTI:          0.287 m LVOT/AV VTI ratio: 0.66  AORTA Ao Asc diam: 4.10 cm  MITRAL VALVE MV Area (PHT): 2.94 cm    SHUNTS MV Decel Time: 258 msec    Systemic VTI:  0.29 m MV E velocity: 89.10 cm/s  Systemic Diam: 2.70 cm MV A velocity: 65.00 cm/s MV E/A ratio:  1.37  Wilbert Bihari MD Electronically signed by Wilbert Bihari MD Signature Date/Time: 03/07/2022/1:05:05 PM    Final   TEE  ECHO INTRAOPERATIVE TEE 01/23/2022  Narrative *INTRAOPERATIVE TRANSESOPHAGEAL REPORT *    Patient Name:   Malik [name] Date of Exam: 01/23/2022 Medical Rec #:  [id number]        Height:       72.0 in Accession #:    [id number]  Weight:       302.6 lb Date of Birth:  [birth year]        BSA:          2.54 m Patient Age:    71 years         BP:           122/71 mmHg Patient Gender: M                HR:           66 bpm. Exam Location:  Anesthesiology  Transesophogeal exam was perform intraoperatively during surgical procedure. Patient was closely monitored under general anesthesia during the entirety of examination.  Indications:      Aortic Stenosis i35.0, CAD Native Vessel i25.10 Sonographer:     Damien Senior RDCS Performing Phys: [id number] DEWARD KALLMAN Diagnosing Phys: Kelly Mace MD  Complications: No known complications during this procedure. POST-OP IMPRESSIONS Limited post-CPB examination: the patient separated from CPB easily _ Left Ventricle: The left ventricular function appears normal, unchanged from pre-bypass. Overall EF 60% with normal wall motion and contractility. (The flattened septal motion originally seen with pacing on separation from bypass normalized once NSR resumed.) _ Right Ventricle: The right ventricle appears unchanged from pre-bypass. _ Aortic Valve: A bioprosthetic valve was placed in the aortic position. Leaflets are freely mobile. There is no aortic insufficiency or stenosis. There is no perivalvular leak. Mean gradient 5 mmHg, peak gradient 10 mmHg. _ Mitral Valve: The mitral valve appears unchanged from pre-bypass. _ Tricuspid Valve: The tricuspid valve appears unchanged from pre-bypass. Trivial TR remains visible around the PA catheter.  PRE-OP FINDINGS Left Ventricle: The left ventricle has normal systolic function, with an ejection fraction of 60-65%, measured 63%. The cavity size was normal. No evidence of left ventricular regional wall motion abnormalities. There is no left ventricular hypertrophy. Left ventricular diastolic function was not evaluated.  Right Ventricle: The right ventricle has normal systolic function. The cavity was normal. There is no increase in right ventricular wall thickness. Right ventricular systolic pressure is normal. Catheter present in the right ventricle.  Left Atrium: Left atrial size was normal in size. No left atrial/left atrial appendage thrombus was detected. Left atrial appendage velocity is normal at greater than 40 cm/s.  Right Atrium: Right atrial size was normal in size. Catheter present in the right atrium.  Interatrial Septum: No  atrial level shunt detected by color flow Doppler. There is no evidence of a patent foramen ovale.  Pericardium: Trivial pericardial effusion is present.  Mitral Valve: The mitral valve is normal in structure. Mitral valve regurgitation is trivial by color flow Doppler. There is no evidence of mitral valve vegetation. Pulmonary venous flow is normal. There is no evidence of mitral stenosis, with peak gradient 3 mHg, mean gradient 1 mmHg.  Tricuspid Valve: The tricuspid valve was normal in structure. Tricuspid valve regurgitation is trivial by color flow Doppler. No evidence of tricuspid stenosis is present. There is no evidence of tricuspid valve vegetation.  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 The left and right cusps are thickened and appears partially fused. Aortic valve regurgitation is mild by color flow Doppler, directed anteriorly toward the mitral valve. There is severe stenosis of the aortic valve, with peak gradient 85 mmHg, mean gradient 51 mmHg. Vmax 460 cm/s. There is moderate thickening present on the aortic valve right coronary and left coronary cusps with severely decreased mobility, with partial fusion of these leaflets.  Pulmonic Valve: The pulmonic valve was normal in structure, with normal leaflet mobiltiy. No evidence of pulmonic stenosis. Pulmonic valve regurgitation is trivial by color flow Doppler, round the PA catheter. .   Aorta: The aortic root, ascending aorta and aortic arch are normal in size and structure. LVOT measures 2.34 cm.  Pulmonary Artery: Norva Purl catheter present on the right. The pulmonary artery is of normal size.  Venous: The inferior vena cava is dilated in size with greater than 50% respiratory variability, suggesting right atrial pressure of 8 mmHg.  Shunts: There is no evidence of an atrial septal defect.  +-------------+---------++ AORTIC VALVE           +-------------+---------++ AV Mean  Grad:51.0 mmHg +-------------+---------++  +-------------+--------++ MITRAL VALVE          +-------------+--------++ MV Mean grad:1.0 mmHg +-------------+--------++   Kelly Mace MD Electronically signed by Kelly Mace MD Signature Date/Time: 01/23/2022/12:57:48 PM    Final    CT SCANS  CT CORONARY MORPH W/CTA COR W/SCORE 12/14/2021  Addendum 12/15/2021  8:57 AM ADDENDUM REPORT: 12/15/2021 08:55  ADDENDUM: Extracardiac findings will be described separately under dictation for contemporaneously obtained CTA chest, abdomen and pelvis dated 12/14/2021. Please see that report for full description of relevant extracardiac findings.   Electronically Signed By: Toribio Aye M.D. On: 12/15/2021 08:55  Narrative CLINICAL DATA:  Aortic Stenosis  EXAM: Cardiac TAVR CT  TECHNIQUE: The patient was scanned on a Siemens Force 192 slice scanner. A 120 kV retrospective scan was triggered in the ascending thoracic aorta at 140 HU's. Gantry rotation speed was 250 msecs and collimation was .6 mm. No beta blockade or nitro were given. The 3D data set was reconstructed in 5% intervals of the R-R cycle. Systolic and diastolic phases were analyzed on a dedicated work station using MPR, MIP and VRT modes. The patient received 80 cc of contrast.  FINDINGS: Aortic Valve: Calcified tri leaflet with score 3469  Aorta: Mild aneurysmal dilatation normal arch vessels mild calcific atherosclerosis  Sino-tubular Junction: 34 mm  Ascending Thoracic Aorta: 39 mm  Aortic Arch: 29 mm  Descending Thoracic Aorta: 29 mm  Sinus of Valsalva Measurements:  Non-coronary: 33.7 mm  Right - coronary: 35.7 mm  Left -   coronary: 33.6 mm  Coronary Artery Height above Annulus:  Left Main: 17.6 mm above annulus  Right Coronary: 19.6 mm above annulus  Virtual Basal Annulus Measurements:  Maximum / Minimum Diameter: 29.4 mm x 27.7 mm  Perimeter: 88.2 mm  Area:  589 mm2  Coronary Arteries: Sufficient height above annulus for deployment  Optimum Fluoroscopic Angle for Delivery: LAO 5 Caudal 5 degrees  IMPRESSION: 1.  Tri leaflet AV with calcium  score 3469  2. Annular area of 589 mm2 suitable for a 29 mm Sapien 3 Ultra valve  3.  Coronary arteries sufficient height above annulus for deployment  4.  Optimum angiographic angle for deployment LAO 5 Caudal 5 degrees  5.  Membranous septal length 8.4 mm  Maude Emmer  Electronically Signed: By: Maude Emmer M.D. On: 12/14/2021 12:19   CT SCANS  CT CARDIAC SCORING (SELF PAY ONLY) 12/12/2012  Addendum 12/12/2012  1:20 PM **ADDENDUM** CREATED: 12/12/2012 13:13:58  OVER-READ INTERPRETATION - CT CHEST  The following report is an over-read performed by radiologist Dr. Waddell HILARIO Calk, M.D. of [facility] on 12/12/2012 13:13:58.  This over-read does not include interpretation of cardiac or coronary anatomy or pathology.  The CTA interpretation by the cardiologist is attached.  Comparison:  07/30/2008  Findings: Within the left upper lobe there is a 5 mm, image 9/series 4.  This is new when compared with the previous exam.  No airspace consolidation or atelectasis identified.  The heart size appears normal.  There is no pericardial effusion.  No adenopathy identified.  The visualized portions of the esophagus appear normal.  Coronary artery calcifications are noted involving the LAD, left circumflex, and RCA coronary arteries.  Spondylosis is identified within the thoracic spine.  IMPRESSION:  1.  5 mm nodule is identified in the left upper lobe.  Not seen on previous exam. If the patient is at high risk for bronchogenic carcinoma, follow-up chest CT at 6-12 months is recommended.  If the patient is at low risk for bronchogenic carcinoma, follow-up chest CT at 12 months is recommended.  This recommendation follows the consensus statement: Guidelines for Management of  Small Pulmonary Nodules Detected on CT Scans: A Statement from the Fleischner Society as published in Radiology 2005; 237:395-400.  These results will be called to the ordering clinician or representative by the Radiologist Assistant, and communication documented in the PACS Dashboard.  **END ADDENDUM** SIGNED BY: Waddell HILARIO Calk, M.D.  Narrative Coronary Calcium  Score:  Indication: Risk Stratification  Protocol:  The patient was scanned on a Siemens Sensation 16 slice scanner.  3mm axial non contrast images were carried out through the heart. The data set was scored on a dedicated work station using the [employer]   Findings:  Ascneding Aorta some calcification and mildly dilated 3.8 cm  Pericardium Normal  Calcium  Score 141 scattered in the mid and distal LAD , RCA and proximal circumflex  Impression:  Coronary calcium  Score 141  This is 66th percentile for age and sex matched controls.  Maude Emmer MD [facility]  Original Report Authenticated By: Maude Emmer, M.D.     ______________________________________________________________________________________________      Risk Assessment/Calculations           Physical Exam VS:  BP (!) 130/50   Pulse 76   Ht 6' (1.829 m)   Wt (!) 302 lb (137 kg)   SpO2 95%   BMI 40.96 kg/m        Wt Readings from Last 3 Encounters:  12/13/23 (!) 302 lb (137 kg)  10/29/22 292 lb 12.8 oz (132.8 kg)  05/28/22 294 lb (133.4 kg)    GEN: Well nourished, well developed in no acute distress NECK: No JVD; No carotid bruits CARDIAC: RRR, no murmurs, rubs, gallops RESPIRATORY:  Clear to auscultation without rales, wheezing or rhonchi  ABDOMEN: Soft, non-tender, non-distended EXTREMITIES:  No edema; No deformity   ASSESSMENT AND PLAN  Preoperative clearance- According to the Revised Cardiac Risk Index (RCRI), his Perioperative Risk of Major Cardiac Event is (%): 6.6. His Functional Capacity in METs is: 5.62 according to the [facility]  Activity Status Index (DASI). Per AHA/ACC guidelines, he is deemed acceptable risk for the planned procedure without additional cardiovascular testing. Will route to surgical team so they are aware.   GIven hx of CABG 02/2022, if possible would continue Aspirin  throughout perioperative period if possible. If deemed necessary by surgical team, would be reasonable to hold.   PVC bigeminy - Asymptomatic. Plan for 3 day ZIO to assess burden. Increase Toprol  to 50mg  daily.   CAD s/p CABG X1 /HLD, LDL goal <70 - 11/2023 labs with PCP LDL 65. Continue Atorvastatin  80mg  daily, aspirin  81mg  daily. Increase Toprol  due 50mg  daily, as above .Recommend aiming for 150 minutes  of moderate intensity activity per week and following a heart healthy diet.    Severe AS s/p AVR-continue SBE prophylaxis. No heart failure symptoms. Continue SBE prophylaxis for any dental procedures. No indication for repeat echo at this time.   HTN - BP well controlled. Continue current antihypertensive regimen Valsartan -hydrochlorothiazide  160-25mg  daily. Increase Toprol , as above. Discussed to monitor BP at home at least 2 hours after medications and sitting for 5-10 minutes.   Morbid obesity- Weight loss via diet and exercise encouraged. Discussed the impact being overweight would have on cardiovascular risk. Considering GLP1 with Dr. Nichole.        Dispo: follow up in 3 mos  Signed, Reche GORMAN Finder, NP

## 2023-12-13 NOTE — Progress Notes (Unsigned)
Enrolled patient for a 3 day Zio XT monitor to be mailed to patients home  Malik [name] to read

## 2023-12-13 NOTE — Patient Instructions (Signed)
 Medication Instructions:  Your physician has recommended you make the following change in your medication:  1.) increase metoprolol  succinate (Toprol  XL) to 50 mg - take one tablet daily  *If you need a refill on your cardiac medications before your next appointment, please call your pharmacy*  Lab Work: none  Testing/Procedures: 3 day Zio Heart Monitor (Patch)  Follow-Up: At [facility], you and your health needs are our priority.  As part of our continuing mission to provide you with exceptional heart care, our providers are all part of one team.  This team includes your primary Cardiologist (physician) and Advanced Practice Providers or APPs (Physician Assistants and Nurse Practitioners) who all work together to provide you with the care you need, when you need it.  Your next appointment:   3 month(s)  Provider:   Maude Emmer, MD     GEOFFRY HEWS- Long Term Monitor Instructions  Your physician has requested you wear a ZIO patch monitor for 3 days.  This is a single patch monitor. [employer] supplies one patch monitor per enrollment. Additional stickers are not available. Please do not apply patch if you will be having a Nuclear Stress Test,  Echocardiogram, Cardiac CT, MRI, or Chest Xray during the period you would be wearing the  monitor. The patch cannot be worn during these tests. You cannot remove and re-apply the  ZIO XT patch monitor.  Your ZIO patch monitor will be mailed 3 day USPS to your address on file. It may take 3-5 days  to receive your monitor after you have been enrolled.  Once you have received your monitor, please review the enclosed instructions. Your monitor  has already been registered assigning a specific monitor serial # to you.  Billing and Patient Assistance Program Information  We have supplied [employer] with any of your insurance information on file for billing purposes. [employer] offers a sliding scale Patient Assistance Program for patients that do  not have  insurance, or whose insurance does not completely cover the cost of the ZIO monitor.  You must apply for the Patient Assistance Program to qualify for this discounted rate.  To apply, please call [employer] at [phone], select option 4, select option 2, ask to apply for  Patient Assistance Program. Meredeth will ask your household income, and how many people  are in your household. They will quote your out-of-pocket cost based on that information.  [employer] will also be able to set up a 36-month, interest-free payment plan if needed.  Applying the monitor   Shave hair from upper left chest.  Hold abrader disc by orange tab. Rub abrader in 40 strokes over the upper left chest as  indicated in your monitor instructions.  Clean area with 4 enclosed alcohol  pads. Let dry.  Apply patch as indicated in monitor instructions. Patch will be placed under collarbone on left  side of chest with arrow pointing upward.  Rub patch adhesive wings for 2 minutes. Remove white label marked 1. Remove the white  label marked 2. Rub patch adhesive wings for 2 additional minutes.  While looking in a mirror, press and release button in center of patch. A small green light will  flash 3-4 times. This will be your only indicator that the monitor has been turned on.  Do not shower for the first 24 hours. You may shower after the first 24 hours.  Press the button if you feel a symptom. You will hear a small click. Record Date, Time and  Symptom in the Patient Logbook.  When you are ready to remove the patch, follow instructions on the last 2 pages of Patient  Logbook. Stick patch monitor onto the last page of Patient Logbook.  Place Patient Logbook in the blue and white box. Use locking tab on box and tape box closed  securely. The blue and white box has prepaid postage on it. Please place it in the mailbox as  soon as possible. Your physician should have your test results approximately 7 days after the   monitor has been mailed back to [facility].  Call [facility] Customer Care at [phone] if you have questions regarding  your ZIO XT patch monitor. Call them immediately if you see an orange light blinking on your  monitor.  If your monitor falls off in less than 4 days, contact our Monitor department at [phone].  If your monitor becomes loose or falls off after 4 days call [employer] at [phone] for  suggestions on securing your monitor

## 2023-12-15 ENCOUNTER — Encounter (HOSPITAL_BASED_OUTPATIENT_CLINIC_OR_DEPARTMENT_OTHER): Payer: Self-pay | Admitting: Family

## 2023-12-19 DIAGNOSIS — I2510 Atherosclerotic heart disease of native coronary artery without angina pectoris: Secondary | ICD-10-CM | POA: Diagnosis not present

## 2023-12-19 DIAGNOSIS — H25812 Combined forms of age-related cataract, left eye: Secondary | ICD-10-CM | POA: Diagnosis not present

## 2023-12-19 DIAGNOSIS — I10 Essential (primary) hypertension: Secondary | ICD-10-CM | POA: Diagnosis not present

## 2023-12-19 DIAGNOSIS — H401121 Primary open-angle glaucoma, left eye, mild stage: Secondary | ICD-10-CM | POA: Diagnosis not present

## 2023-12-25 DIAGNOSIS — I493 Ventricular premature depolarization: Secondary | ICD-10-CM | POA: Diagnosis not present

## 2023-12-26 DIAGNOSIS — I493 Ventricular premature depolarization: Secondary | ICD-10-CM | POA: Diagnosis not present

## 2023-12-30 ENCOUNTER — Ambulatory Visit (HOSPITAL_BASED_OUTPATIENT_CLINIC_OR_DEPARTMENT_OTHER): Payer: Self-pay | Admitting: Family

## 2023-12-31 DIAGNOSIS — I25709 Atherosclerosis of coronary artery bypass graft(s), unspecified, with unspecified angina pectoris: Secondary | ICD-10-CM | POA: Diagnosis not present

## 2023-12-31 DIAGNOSIS — I10 Essential (primary) hypertension: Secondary | ICD-10-CM | POA: Diagnosis not present

## 2024-01-02 DIAGNOSIS — I25810 Atherosclerosis of coronary artery bypass graft(s) without angina pectoris: Secondary | ICD-10-CM | POA: Diagnosis not present

## 2024-01-02 DIAGNOSIS — H25811 Combined forms of age-related cataract, right eye: Secondary | ICD-10-CM | POA: Diagnosis not present

## 2024-01-02 DIAGNOSIS — H401111 Primary open-angle glaucoma, right eye, mild stage: Secondary | ICD-10-CM | POA: Diagnosis not present

## 2024-01-02 DIAGNOSIS — I10 Essential (primary) hypertension: Secondary | ICD-10-CM | POA: Diagnosis not present

## 2024-01-07 DIAGNOSIS — M1712 Unilateral primary osteoarthritis, left knee: Secondary | ICD-10-CM | POA: Diagnosis not present

## 2024-01-17 ENCOUNTER — Ambulatory Visit: Admitting: Cardiovascular Disease

## 2024-02-26 NOTE — Progress Notes (Signed)
 Office Visit    Patient Name: Malik [name] Date of Encounter: 03/04/2024  Primary Care Provider:  Nichole Senior, MD Primary Cardiologist:  Maude Emmer, MD Primary Electrophysiologist: None  Chief Complaint    Malik [name] is a 73 y.o. male with PMH of CAD s/p CABG x 1 and AVR with bioprosthetic valve, HTN, HLD, pulmonary nodule, frequent PVCs, GERD who presents today for f/u  Past Medical History    Past Medical History:  Diagnosis Date   Aortic stenosis    Arthritis    ASHD (arteriosclerotic heart disease)    Chest pressure    COVID-19 virus infection 11/2019   x 3   GERD (gastroesophageal reflux disease)    Heart murmur    History of kidney stones    Hyperglycemia    Hyperlipidemia    Hypertension    Nephrolithiasis    Obesity    Past Surgical History:  Procedure Laterality Date   AORTIC VALVE REPLACEMENT N/A 01/23/2022   Procedure: AORTIC VALVE REPLACEMENT (AVR) USING INSPIRIS RESILIA  AORTIC VALVE;  Surgeon: Maryjane Mt, MD;  Location: [facility] OR;  Service: Open Heart Surgery;  Laterality: N/A;  Mid sternotomy   APPENDECTOMY     CIRCUMCISION N/A 11/03/2019   Procedure: CIRCUMCISION ADULT;  Surgeon: Watt Rush, MD;  Location: [facility] ORS;  Service: Urology;  Laterality: N/A;   CORONARY ARTERY BYPASS GRAFT N/A 01/23/2022   Procedure: CORONARY ARTERY BYPASS GRAFTING (CABGX1) USING ENDOSCOPICALLY HARVESTED RIGHT GREATER SAPHENOUS VEIN;  Surgeon: Maryjane Mt, MD;  Location: [facility] OR;  Service: Open Heart Surgery;  Laterality: N/A;   RIGHT/LEFT HEART CATH AND CORONARY ANGIOGRAPHY N/A 11/29/2021   Procedure: RIGHT/LEFT HEART CATH AND CORONARY ANGIOGRAPHY;  Surgeon: Wonda Sharper, MD;  Location: [facility] INVASIVE CV LAB;  Service: Cardiovascular;  Laterality: N/A;   TEE WITHOUT CARDIOVERSION N/A 01/23/2022   Procedure: TRANSESOPHAGEAL ECHOCARDIOGRAM (TEE);  Surgeon: Maryjane Mt, MD;  Location: [facility] OR;  Service: Open Heart Surgery;  Laterality: N/A;    Allergies  No  Known Allergies  History of Present Illness    Malik [name]  is a 73 year old male with the above mention past medical history who presents today for follow-up.  Seen by PA recently for labile BP. Tried on coreg  but caused headaches Then started on ARB and diuretic. 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 Walking some  AVR with 27 mm Inspiris resilia valve TTE 2024 with mean gradient 7 mmHg and normal EF  He had a single SVG to RCA at time of his valve surgery  He has had some dental work and took his SBE prior Still has some bad teeth and needs f/u work   Seen by NP 12/13/23 ECG noted atrial bigeminny. He is on Toprol  3 Day monitor with 3.4% PVC burden and < 1% PAC burden longest SVT was only 14 beats   Home Medications    Current Outpatient Medications  Medication Sig Dispense Refill   acetaminophen  (TYLENOL ) 325 MG tablet Take 2 tablets (650 mg total) by mouth every 4 (four) hours as needed.     amoxicillin  (AMOXIL ) 500 MG tablet Take 4 tablets by mouth one hour prior to dental procedures. 4 tablet 2   aspirin  EC 81 MG tablet Take 1 tablet (81 mg total) by mouth daily.     atorvastatin  (LIPITOR) 80 MG tablet TAKE 1 TABLET(80 MG) BY MOUTH DAILY 90 tablet 1   latanoprost  (XALATAN ) 0.005 % ophthalmic solution  Place 1 drop into both eyes at bedtime.     metoprolol  succinate (TOPROL -XL) 50 MG 24 hr tablet Take 1 tablet (50 mg total) by mouth daily. Take with or immediately following a meal. 90 tablet 3   semaglutide-weight management (WEGOVY) 0.5 MG/0.5ML SOAJ SQ injection Inject 0.5 mg into the skin.     valsartan -hydrochlorothiazide  (DIOVAN -HCT) 160-25 MG tablet TAKE 1 TABLET BY MOUTH DAILY 90 tablet 1   No current facility-administered medications for this visit.     Review of Systems  Please see the history of present illness.    (+) Chronic knee pain  All other systems reviewed and are otherwise negative except as noted above.  Physical Exam    Wt Readings  from Last 3 Encounters:  03/04/24 269 lb 8 oz (122.2 kg)  12/13/23 (!) 302 lb (137 kg)  10/29/22 292 lb 12.8 oz (132.8 kg)   VS: Vitals:   03/04/24 0947  BP: (!) 102/58  Pulse: 67  SpO2: 97%   ,Body mass index is 36.55 kg/m.  Affect appropriate Healthy:  appears stated age HEENT: normal Neck supple with no adenopathy JVP normal no bruits no thyromegaly Lungs clear with no wheezing and good diaphragmatic motion Heart:  S1/S2 SEM through AVR no AR murmur, no rub, gallop or click PMI normal Prior sternotomy  Abdomen: benighn, BS positve, no tenderness, no AAA no bruit.  No HSM or HJR Distal pulses intact with no bruits No edema Neuro non-focal Skin warm and dry No muscular weakness  EKG/LABS/ Recent Cardiac Studies    ECG:  SR IVCD  atrial bigeminny  12/13/23   Lab Results  Component Value Date   WBC 10.3 01/26/2022   HGB 9.1 (L) 01/26/2022   HCT 26.1 (L) 01/26/2022   MCV 91.3 01/26/2022   PLT 102 (L) 01/26/2022   Lab Results  Component Value Date   CREATININE 0.99 05/15/2022   BUN 13 05/15/2022   NA 140 05/15/2022   K 4.2 05/15/2022   CL 100 05/15/2022   CO2 27 05/15/2022   Lab Results  Component Value Date   ALT 21 05/15/2022   AST 15 05/15/2022   ALKPHOS 91 05/15/2022   BILITOT 0.5 05/15/2022   Lab Results  Component Value Date   CHOL 106 05/15/2022   HDL 36 (L) 05/15/2022   LDLCALC 55 05/15/2022   TRIG 70 05/15/2022   CHOLHDL 2.9 05/15/2022    Lab Results  Component Value Date   HGBA1C 5.9 (H) 01/19/2022    Cardiac Studies & Procedures   ______________________________________________________________________________________________ CARDIAC CATHETERIZATION  CARDIAC CATHETERIZATION 11/29/2021  Conclusion   Ost RCA to Prox RCA lesion is 80% stenosed.   Prox LAD to Mid LAD lesion is 30% stenosed.   Prox Cx to Mid Cx lesion is 70% stenosed.   There is severe aortic valve stenosis.  1.  Severe single-vessel coronary artery disease with severe  ostial stenosis of the RCA and left-to-right collaterals supplying the distal RCA branches which fill both antegrade and via collaterals 2.  Patent left main and LAD without significant stenosis 3.  Patent left circumflex into a large first OM and moderately severe stenosis of the AV circumflex beyond the OM supplying a small myocardial territory 4.  Severe calcific aortic stenosis with mean transvalvular gradient 50 mmHg, peak to peak gradient 61 mmHg, calculated valve area 0.89 cm 5.  Normal right heart hemodynamics  Recommendations: Favor medical therapy for CAD, CT angiography studies for planning of aortic valve replacement via  TAVR versus SAVR  Findings Coronary Findings Diagnostic  Dominance: Right  Left Anterior Descending The LAD courses to the apex and supplies collaterals to the RCA.  The vessel has mild nonobstructive disease.  There is no significant stenosis in the LAD or its diagonal branches. Prox LAD to Mid LAD lesion is 30% stenosed.  Left Circumflex The circumflex is patent into a large obtuse marginal.  Just beyond the marginal origin, there is a moderately severe stenosis but a small myocardial territory beyond the lesion. Prox Cx to Mid Cx lesion is 70% stenosed.  Right Coronary Artery Ost RCA to Prox RCA lesion is 80% stenosed. The lesion is calcified. Lesion difficult to visualize and may be more severe.  There is a well-formed left-to-right collateral and to and fro flow seen on RCA imaging.  There is no high-grade stenosis throughout the mid or distal portion of the RCA.  The only visible lesion is at the ostium.  Right Posterior Descending Artery Collaterals RPDA filled by collaterals from 1st Sept.  First Right Posterolateral Branch Collaterals 1st RPL filled by collaterals from 2nd Sept.  Intervention  No interventions have been documented.     ECHOCARDIOGRAM  ECHOCARDIOGRAM COMPLETE 03/07/2022  Narrative ECHOCARDIOGRAM REPORT    Patient  Name:   Malik [name] Date of Exam: 03/07/2022 Medical Rec #:  [id number]        Height:       72.0 in Accession #:    [id number]       Weight:       298.0 lb Date of Birth:  Aug 13, 1950        BSA:          2.524 m Patient Age:    71 years         BP:           144/76 mmHg Patient Gender: M                HR:           59 bpm. Exam Location:  [address]  Procedure: 2D Echo, Cardiac Doppler, Color Doppler and Intracardiac Opacification Agent  Indications:    I35.0 Nonrheumatic aortic (valve) stenosis  History:        Patient has prior history of Echocardiogram examinations, most recent 01/23/2022. Signs/Symptoms:Murmur and Dyspnea; Risk Factors:Hypertension and Dyslipidemia. Aortic valve replacement (27mm Inspiris Resilia). Obesity. Hyperglycemia.  Sonographer:    Jon Hacker RCS Referring Phys: MANUELITA KATHEE RUMMER  IMPRESSIONS   1. Left ventricular ejection fraction, by estimation, is 60 to 65%. The left ventricle has normal function. The left ventricle has no regional wall motion abnormalities. Left ventricular diastolic parameters were normal. 2. Right ventricular systolic function is normal. The right ventricular size is normal. 3. Right atrial size was mild to moderately dilated. 4. The mitral valve is normal in structure. No evidence of mitral valve regurgitation. No evidence of mitral stenosis. 5. The aortic valve has been repaired/replaced. There is a 27 mm Inspiris Resilia valve present in the aortic position. No aortic stenosis is present. Aortic valve area, by VTI measures 3.79 cm. Aortic valve mean gradient measures 7.0 mmHg. Aortic valve Vmax measures 1.87 m/s. 6. Aortic dilatation noted. There is mild dilatation of the ascending aorta, measuring 41 mm. 7. The inferior vena cava is normal in size with greater than 50% respiratory variability, suggesting right atrial pressure of 3 mmHg. 8. Compared to echo dated 10/2021 there is now an AVR present functioning  normally.  FINDINGS Left Ventricle: Left ventricular ejection fraction, by estimation, is 60 to 65%. The left ventricle has normal function. The left ventricle has no regional wall motion abnormalities. The left ventricular internal cavity size was normal in size. There is no left ventricular hypertrophy. Left ventricular diastolic parameters were normal. Normal left ventricular filling pressure.  Right Ventricle: The right ventricular size is normal. No increase in right ventricular wall thickness. Right ventricular systolic function is normal.  Left Atrium: Left atrial size was normal in size.  Right Atrium: Right atrial size was mild to moderately dilated.  Pericardium: There is no evidence of pericardial effusion.  Mitral Valve: The mitral valve is normal in structure. No evidence of mitral valve regurgitation. No evidence of mitral valve stenosis.  Tricuspid Valve: The tricuspid valve is normal in structure. Tricuspid valve regurgitation is not demonstrated. No evidence of tricuspid stenosis.  Aortic Valve: The aortic valve has been repaired/replaced. Aortic valve regurgitation is not visualized. No aortic stenosis is present. Aortic valve mean gradient measures 7.0 mmHg. Aortic valve peak gradient measures 14.0 mmHg. Aortic valve area, by VTI measures 3.79 cm. There is a 27 mm Inspiris Resilia valve present in the aortic position.  Pulmonic Valve: The pulmonic valve was normal in structure. Pulmonic valve regurgitation is not visualized. No evidence of pulmonic stenosis.  Aorta: Aortic dilatation noted. There is mild dilatation of the ascending aorta, measuring 41 mm.  Venous: The inferior vena cava is normal in size with greater than 50% respiratory variability, suggesting right atrial pressure of 3 mmHg.  IAS/Shunts: No atrial level shunt detected by color flow Doppler.   LEFT VENTRICLE PLAX 2D LVIDd:         5.20 cm   Diastology LVIDs:         3.60 cm   LV e' medial:     10.70 cm/s LV PW:         1.20 cm   LV E/e' medial:  8.3 LV IVS:        0.80 cm   LV e' lateral:   11.00 cm/s LVOT diam:     2.70 cm   LV E/e' lateral: 8.1 LV SV:         164 LV SV Index:   65 LVOT Area:     5.73 cm   RIGHT VENTRICLE RV Basal diam:  2.80 cm RV S prime:     11.00 cm/s TAPSE (M-mode): 1.6 cm  LEFT ATRIUM             Index        RIGHT ATRIUM           Index LA diam:        5.20 cm 2.06 cm/m   RA Area:     26.80 cm LA Vol (A2C):   56.2 ml 22.27 ml/m  RA Volume:   96.40 ml  38.19 ml/m LA Vol (A4C):   87.4 ml 34.63 ml/m LA Biplane Vol: 74.3 ml 29.44 ml/m AORTIC VALVE AV Area (Vmax):    3.58 cm AV Area (Vmean):   3.26 cm AV Area (VTI):     3.79 cm AV Vmax:           187.00 cm/s AV Vmean:          121.000 cm/s AV VTI:            0.433 m AV Peak Grad:      14.0 mmHg AV Mean Grad:      7.0  mmHg LVOT Vmax:         117.00 cm/s LVOT Vmean:        68.800 cm/s LVOT VTI:          0.287 m LVOT/AV VTI ratio: 0.66  AORTA Ao Asc diam: 4.10 cm  MITRAL VALVE MV Area (PHT): 2.94 cm    SHUNTS MV Decel Time: 258 msec    Systemic VTI:  0.29 m MV E velocity: 89.10 cm/s  Systemic Diam: 2.70 cm MV A velocity: 65.00 cm/s MV E/A ratio:  1.37  Wilbert Bihari MD Electronically signed by Wilbert Bihari MD Signature Date/Time: 03/07/2022/1:05:05 PM    Final   TEE  ECHO INTRAOPERATIVE TEE 01/23/2022  Narrative *INTRAOPERATIVE TRANSESOPHAGEAL REPORT *    Patient Name:   Malik [name] Date of Exam: 01/23/2022 Medical Rec #:  [id number]        Height:       72.0 in Accession #:    [id number]       Weight:       302.6 lb Date of Birth:  [birth year]        BSA:          2.54 m Patient Age:    71 years         BP:           122/71 mmHg Patient Gender: M                HR:           66 bpm. Exam Location:  Anesthesiology  Transesophogeal exam was perform intraoperatively during surgical procedure. Patient was closely monitored under general anesthesia during the  entirety of examination.  Indications:     Aortic Stenosis i35.0, CAD Native Vessel i25.10 Sonographer:     Damien Senior RDCS Performing Phys: [id number] DEWARD KALLMAN Diagnosing Phys: Kelly Mace MD  Complications: No known complications during this procedure. POST-OP IMPRESSIONS Limited post-CPB examination: the patient separated from CPB easily _ Left Ventricle: The left ventricular function appears normal, unchanged from pre-bypass. Overall EF 60% with normal wall motion and contractility. (The flattened septal motion originally seen with pacing on separation from bypass normalized once NSR resumed.) _ Right Ventricle: The right ventricle appears unchanged from pre-bypass. _ Aortic Valve: A bioprosthetic valve was placed in the aortic position. Leaflets are freely mobile. There is no aortic insufficiency or stenosis. There is no perivalvular leak. Mean gradient 5 mmHg, peak gradient 10 mmHg. _ Mitral Valve: The mitral valve appears unchanged from pre-bypass. _ Tricuspid Valve: The tricuspid valve appears unchanged from pre-bypass. Trivial TR remains visible around the PA catheter.  PRE-OP FINDINGS Left Ventricle: The left ventricle has normal systolic function, with an ejection fraction of 60-65%, measured 63%. The cavity size was normal. No evidence of left ventricular regional wall motion abnormalities. There is no left ventricular hypertrophy. Left ventricular diastolic function was not evaluated.  Right Ventricle: The right ventricle has normal systolic function. The cavity was normal. There is no increase in right ventricular wall thickness. Right ventricular systolic pressure is normal. Catheter present in the right ventricle.  Left Atrium: Left atrial size was normal in size. No left atrial/left atrial appendage thrombus was detected. Left atrial appendage velocity is normal at greater than 40 cm/s.  Right Atrium: Right atrial size was normal in size. Catheter present in  the right atrium.  Interatrial Septum: No atrial level shunt detected by color flow Doppler. There is no evidence of a patent foramen ovale.  Pericardium: Trivial pericardial effusion is present.  Mitral Valve: The mitral valve is normal in structure. Mitral valve regurgitation is trivial by color flow Doppler. There is no evidence of mitral valve vegetation. Pulmonary venous flow is normal. There is no evidence of mitral stenosis, with peak gradient 3 mHg, mean gradient 1 mmHg.  Tricuspid Valve: The tricuspid valve was normal in structure. Tricuspid valve regurgitation is trivial by color flow Doppler. No evidence of tricuspid stenosis is present. There is no evidence of tricuspid valve vegetation.  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 The left and right cusps are thickened and appears partially fused. Aortic valve regurgitation is mild by color flow Doppler, directed anteriorly toward the mitral valve. There is severe stenosis of the aortic valve, with peak gradient 85 mmHg, mean gradient 51 mmHg. Vmax 460 cm/s. There is moderate thickening present on the aortic valve right coronary and left coronary cusps with severely decreased mobility, with partial fusion of these leaflets.  Pulmonic Valve: The pulmonic valve was normal in structure, with normal leaflet mobiltiy. No evidence of pulmonic stenosis. Pulmonic valve regurgitation is trivial by color flow Doppler, round the PA catheter. .   Aorta: The aortic root, ascending aorta and aortic arch are normal in size and structure. LVOT measures 2.34 cm.  Pulmonary Artery: Norva Purl catheter present on the right. The pulmonary artery is of normal size.  Venous: The inferior vena cava is dilated in size with greater than 50% respiratory variability, suggesting right atrial pressure of 8 mmHg.  Shunts: There is no evidence of an atrial septal defect.  +-------------+---------++ AORTIC VALVE            +-------------+---------++ AV Mean Grad:51.0 mmHg +-------------+---------++  +-------------+--------++ MITRAL VALVE          +-------------+--------++ MV Mean grad:1.0 mmHg +-------------+--------++   Kelly Mace MD Electronically signed by Kelly Mace MD Signature Date/Time: 01/23/2022/12:57:48 PM    Final  MONITORS  LONG TERM MONITOR (3-14 DAYS) 12/26/2023  Narrative Patch Wear Time:  2 days and 1 hours (2025-09-22T20:44:06-398 to 2025-09-24T22:27:14-0400)  Patient had a min HR of 48 bpm, max HR of 154 bpm, and avg HR of 66 bpm. Predominant underlying rhythm was Sinus Rhythm. Bundle Branch Block/IVCD was present. 2 Supraventricular Tachycardia runs occurred, the run with the fastest interval lasting 14 beats with a max rate of 154 bpm (avg 135 bpm); the run with the fastest interval was also the longest. Isolated SVEs were occasional (1.3%, 2486), SVE Couplets were rare (<1.0%, 420), and SVE Triplets were rare (<1.0%, 21). Isolated VEs were occasional (3.4%, 6444), VE Couplets were rare (<1.0%, 25), and no VE Triplets were present. Ventricular Bigeminy and Trigeminy were present.  Maude Emmer MD [facility]   CT SCANS  CT CORONARY MORPH W/CTA COR W/SCORE 12/14/2021  Addendum 12/15/2021  8:57 AM ADDENDUM REPORT: 12/15/2021 08:55  ADDENDUM: Extracardiac findings will be described separately under dictation for contemporaneously obtained CTA chest, abdomen and pelvis dated 12/14/2021. Please see that report for full description of relevant extracardiac findings.   Electronically Signed By: Toribio Aye M.D. On: 12/15/2021 08:55  Narrative CLINICAL DATA:  Aortic Stenosis  EXAM: Cardiac TAVR CT  TECHNIQUE: The patient was scanned on a Siemens Force 192 slice scanner. A 120 kV retrospective scan was triggered in the ascending thoracic aorta at 140 HU's. Gantry rotation speed was 250 msecs and collimation was .6 mm. No beta blockade or  nitro were given. The 3D data set was  reconstructed in 5% intervals of the R-R cycle. Systolic and diastolic phases were analyzed on a dedicated work station using MPR, MIP and VRT modes. The patient received 80 cc of contrast.  FINDINGS: Aortic Valve: Calcified tri leaflet with score 3469  Aorta: Mild aneurysmal dilatation normal arch vessels mild calcific atherosclerosis  Sino-tubular Junction: 34 mm  Ascending Thoracic Aorta: 39 mm  Aortic Arch: 29 mm  Descending Thoracic Aorta: 29 mm  Sinus of Valsalva Measurements:  Non-coronary: 33.7 mm  Right - coronary: 35.7 mm  Left -   coronary: 33.6 mm  Coronary Artery Height above Annulus:  Left Main: 17.6 mm above annulus  Right Coronary: 19.6 mm above annulus  Virtual Basal Annulus Measurements:  Maximum / Minimum Diameter: 29.4 mm x 27.7 mm  Perimeter: 88.2 mm  Area: 589 mm2  Coronary Arteries: Sufficient height above annulus for deployment  Optimum Fluoroscopic Angle for Delivery: LAO 5 Caudal 5 degrees  IMPRESSION: 1.  Tri leaflet AV with calcium  score 3469  2. Annular area of 589 mm2 suitable for a 29 mm Sapien 3 Ultra valve  3.  Coronary arteries sufficient height above annulus for deployment  4.  Optimum angiographic angle for deployment LAO 5 Caudal 5 degrees  5.  Membranous septal length 8.4 mm  Maude Emmer  Electronically Signed: By: Maude Emmer M.D. On: 12/14/2021 12:19   CT SCANS  CT CARDIAC SCORING (SELF PAY ONLY) 12/12/2012  Addendum 12/12/2012  1:20 PM **ADDENDUM** CREATED: 12/12/2012 13:13:58  OVER-READ INTERPRETATION - CT CHEST  The following report is an over-read performed by radiologist Dr. Waddell HILARIO Calk, M.D. of [facility] on 12/12/2012 13:13:58.  This over-read does not include interpretation of cardiac or coronary anatomy or pathology.  The CTA interpretation by the cardiologist is attached.  Comparison:  07/30/2008  Findings: Within the left upper lobe  there is a 5 mm, image 9/series 4.  This is new when compared with the previous exam.  No airspace consolidation or atelectasis identified.  The heart size appears normal.  There is no pericardial effusion.  No adenopathy identified.  The visualized portions of the esophagus appear normal.  Coronary artery calcifications are noted involving the LAD, left circumflex, and RCA coronary arteries.  Spondylosis is identified within the thoracic spine.  IMPRESSION:  1.  5 mm nodule is identified in the left upper lobe.  Not seen on previous exam. If the patient is at high risk for bronchogenic carcinoma, follow-up chest CT at 6-12 months is recommended.  If the patient is at low risk for bronchogenic carcinoma, follow-up chest CT at 12 months is recommended.  This recommendation follows the consensus statement: Guidelines for Management of Small Pulmonary Nodules Detected on CT Scans: A Statement from the Fleischner Society as published in Radiology 2005; 237:395-400.  These results will be called to the ordering clinician or representative by the Radiologist Assistant, and communication documented in the PACS Dashboard.  **END ADDENDUM** SIGNED BY: Waddell HILARIO Calk, M.D.  Narrative Coronary Calcium  Score:  Indication: Risk Stratification  Protocol:  The patient was scanned on a Siemens Sensation 16 slice scanner.  3mm axial non contrast images were carried out through the heart. The data set was scored on a dedicated work station using the Advance auto   Findings:  Ascneding Aorta some calcification and mildly dilated 3.8 cm  Pericardium Normal  Calcium  Score 141 scattered in the mid and distal LAD , RCA and proximal circumflex  Impression:  Coronary calcium  Score 141  This is 66th percentile for age and sex matched controls.  Maude Emmer MD [facility]  Original Report Authenticated By: Maude Emmer, M.D.      ______________________________________________________________________________________________      Assessment & Plan    1.  Coronary artery disease: -s/p CABG x 1 12/24/21 by Dr Maryjane for treatment of single-vessel severe ostial stenosis with SVG to RCA -No angina -Continue current GDMT with ASA 81 mg, Lipitor 80 mg, and Toprol -XL 25 mg   2.  Severe aortic stenosis: -Severe calcific aortic stenosis with mean transvalvular gradient 50 mmHg, peak to peak  -s/p AVR repair with 27 mm bioprosthetic 12/24/21 [facility] -2D echo completed 03/07/22 with aortic mean gradient of 7.0 -SBE prophylaxis discussed and being followed for recent bone graft and dental work   3.  Essential hypertension: -Patient's blood pressure has been in the 120s to 130s systolically at home. -We will continue current antihypertensive regimen with valsartan  80 mg and HCTZ 25 mg, Toprol  XL 25 mg  4.  Hyperlipidemia: -Patient's LDL cholesterol was 55 in 10/7973 -Continue atorvastatin  80 mg   5.  Morbid obesity: -Patient's BMI is 42.07 kg/m  -Patient was advised to increase physical activity to at least 150 minutes/weeks of moderate intensity exercise  6. 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  Concerns regarding medicines are outlined above.   Signed, Maude Emmer, MD [facility] 03/04/2024, 10:11 AM

## 2024-03-04 ENCOUNTER — Ambulatory Visit: Attending: Cardiology | Admitting: Cardiovascular Disease

## 2024-03-04 ENCOUNTER — Encounter: Payer: Self-pay | Admitting: Cardiovascular Disease

## 2024-03-04 VITALS — BP 102/58 | HR 67 | Ht 72.0 in | Wt 269.5 lb

## 2024-03-04 DIAGNOSIS — E785 Hyperlipidemia, unspecified: Secondary | ICD-10-CM | POA: Diagnosis not present

## 2024-03-04 DIAGNOSIS — I2510 Atherosclerotic heart disease of native coronary artery without angina pectoris: Secondary | ICD-10-CM

## 2024-03-04 DIAGNOSIS — I10 Essential (primary) hypertension: Secondary | ICD-10-CM

## 2024-03-04 DIAGNOSIS — Z952 Presence of prosthetic heart valve: Secondary | ICD-10-CM | POA: Diagnosis not present

## 2024-03-04 NOTE — Patient Instructions (Addendum)
 Medication Instructions:  No changes   *If you need a refill on your cardiac medications before your next appointment, please call your pharmacy*   Lab Work: Not needed    Testing/Procedures: Not needed   Follow-Up: At [facility], you and your health needs are our priority.  As part of our continuing mission to provide you with exceptional heart care, we have created designated Provider Care Teams.  These Care Teams include your primary Cardiologist (physician) and Advanced Practice Providers (APPs -  Physician Assistants and Nurse Practitioners) who all work together to provide you with the care you need, when you need it.     Your next appointment:   12 month(s)  The format for your next appointment:   In Person  Provider:   Maude Emmer, MD

## 2024-03-30 ENCOUNTER — Encounter: Payer: Medicare (Managed Care) | Attending: Family | Primary: Family

## 2024-03-31 ENCOUNTER — Ambulatory Visit: Admit: 2024-03-31 | Discharge: 2024-03-31 | Payer: Medicare (Managed Care) | Attending: Family | Primary: Family

## 2024-03-31 VITALS — BP 132/78 | HR 76 | Temp 96.90000°F | Ht 73.0 in | Wt 228.8 lb

## 2024-03-31 DIAGNOSIS — M2550 Pain in unspecified joint: Principal | ICD-10-CM

## 2024-03-31 NOTE — Progress Notes (Signed)
 [facility]  [address]  [place], [place] [address]  [phone]      Dustin Zimmerman is a 74 y.o. male who presents today for his  medicalconditions/complaints as noted below.  Norleen MALVA Pouch is c/o of Mass (Nodules on fingers, started a year ago, now on toes.  Initially had on neck and moved to left thumb/fingers.  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    HPI:   Mass  Associated symptoms include arthralgias. Pertinent negatives include no abdominal pain, chest pain, chills, coughing, diaphoresis, fatigue, fever, headaches, joint swelling, myalgias, nausea, neck pain, rash or vomiting.     74 year old male with known history of hypothyroid here today to request blood work. Also has changing/moving bumps on feet/hands and neck for past 1 year. States he is working with functional medicine MD and they have requested full blood work forensic scientist. He has also seen a dermatologist with no findings for skin changes. States he gets pain in his heel and wrist, and has no hx of arthritis seen on xrays in the past. States bumps appears for several days or weeks then move to other areas. No redness or warmth to any bump or joint. No fever, chills. Does have a lot of random itching without rash. Has tried epsom salt soaks, baking soda, vinegar, new looser s.hoes. He does walk a lot and stands for his job at [employer].   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)     Myalgia     Polyp of colon 04/17/2017      Past Surgical History:   Procedure Laterality Date    EYE SURGERY      FOOT SURGERY      middle toe    KNEE SURGERY      SHOULDER SURGERY       Family History   Problem Relation Age of Onset    No Known Problems Mother     Other Father         aspiration pneumonia    Diabetes  Brother      Social History     Tobacco Use    Smoking status: Former     Current packs/day: 1.00     Average packs/day: 1 pack/day for 10.0 years (10.0 ttl pk-yrs)     Types: Pipe, Cigarettes    Smokeless tobacco: Never   Substance Use Topics    Alcohol use: Yes     Alcohol/week: 1.0 - 2.0 standard drink of alcohol     Types: 1 - 2 Standard drinks or equivalent per week     Comment: occ      Current Outpatient Medications   Medication Sig Dispense Refill    vardenafil (LEVITRA) 20 MG tablet Take 1 tablet by mouth daily as needed      famotidine  (PEPCID ) 20 MG tablet Take 1 tablet by mouth nightly      levothyroxine  (SYNTHROID ) 75 MCG tablet TAKE 1 TABLET BY MOUTH DAILY 90 tablet 3    timolol  (TIMOPTIC ) 0.25 % ophthalmic solution       fluorometholone (FML) 0.1 % ophthalmic suspension       mupirocin  (BACTROBAN ) 2 %  ointment Apply topically 3 times daily. 15 g 0    pantoprazole  (PROTONIX ) 40 MG tablet       omeprazole  (PRILOSEC) 40 MG delayed release capsule TAKE 1 CAPSULE BY MOUTH EVERY MORNING BEFORE BREAKFAST 30 capsule 3     No current facility-administered medications for this visit.     Allergies   Allergen Reactions    Motrin [Ibuprofen] Other (See Comments)     kidney    Percocet [Oxycodone-Acetaminophen] Hives    Motrin [Ibuprofen] Nausea And Vomiting     kidney    Percocet [Oxycodone-Acetaminophen] Rash       Health Maintenance   Topic Date Due    Shingles vaccine (1 of 2) Never done    Flu vaccine (1) Never done    COVID-19 Vaccine (1 - 2024-25 season) Never done    Annual Wellness Visit (Medicare Advantage)  03/26/2024    Prostate Specific Antigen (PSA) Screening or Monitoring  12/04/2024    Respiratory Syncytial Virus (RSV) Pregnant or age 75 yrs+ (1 - 1-dose 75+ series) 12/27/2024    Depression Screen  03/31/2025    Colorectal Cancer Screen  04/20/2025    DTaP/Tdap/Td vaccine (3 - Td or Tdap) 06/14/2026    Lipids  12/04/2028    Pneumococcal 50+ years Vaccine  Completed    AAA screen  Completed     Hepatitis C screen  Addressed    Hepatitis A vaccine  Aged Out    Hepatitis B vaccine  Aged Out    Hib vaccine  Aged Out    Polio vaccine  Aged Out    Meningococcal (ACWY) vaccine  Aged Out    Meningococcal B vaccine  Aged Out    A1C test (Diabetic or Prediabetic)  Discontinued       Subjective:      Review of Systems   Constitutional:  Positive for activity change. Negative for appetite change, chills, diaphoresis, fatigue and fever.   Eyes:  Negative for visual disturbance.   Respiratory:  Negative for cough, chest tightness and shortness of breath.    Cardiovascular:  Negative for chest pain, palpitations and leg swelling.   Gastrointestinal:  Negative for abdominal pain, nausea and vomiting.   Musculoskeletal:  Positive for arthralgias. Negative for gait problem, joint swelling, myalgias, neck pain and neck stiffness.   Skin:  Negative for color change, pallor, rash and wound.   Neurological:  Negative for dizziness and headaches.   Hematological:  Negative for adenopathy.   Psychiatric/Behavioral:  Negative for sleep disturbance.          :     Physical Exam  Vitals and nursing note reviewed.   Constitutional:       General: He is not in acute distress.     Appearance: Normal appearance. He is well-developed. He is obese. He is not ill-appearing or diaphoretic.   HENT:      Head: Normocephalic and atraumatic.   Eyes:      Conjunctiva/sclera: Conjunctivae normal.   Cardiovascular:      Rate and Rhythm: Normal rate and regular rhythm.      Pulses: Normal pulses.      Heart sounds: Normal heart sounds. No murmur heard.     Comments: No LE edema  Pulmonary:      Effort: Pulmonary effort is normal.      Breath sounds: Normal breath sounds.   Musculoskeletal:         General: Tenderness (inner aspect of right 5th toe with  small corn like lesion ( corn pads mildly helpful)) present. No swelling, deformity or signs of injury.      Cervical back: Neck supple.      Left lower leg: No edema.   Skin:     General: Skin is  warm and dry.   Neurological:      General: No focal deficit present.      Mental Status: He is alert and oriented to person, place, and time. Mental status is at baseline.   Psychiatric:         Mood and Affect: Mood normal.         Behavior: Behavior normal.         Thought Content: Thought content normal.         Judgment: Judgment normal.         Assessment:       Diagnosis Orders   1. Arthralgia, unspecified joint  Uric Acid    Rheumatoid Factor    ANA Screen with Reflex    CBC    Vitamin C    Vitamin D 25 Hydroxy      2. Mass of skin  Rheumatoid Factor    ANA Screen with Reflex    CBC      3. Hypothyroidism, unspecified type  Homocysteine    TSH    T3, Free    T4, Free    Thyroid Antibodies      4. Special screening for malignant neoplasm of prostate  PSA Screening      5. Fatigue, unspecified type  ANA Screen with Reflex    Comprehensive Metabolic Panel    Homocysteine    Testosterone, free, total    Estrogens, Fractionated    DHEA-Sulfate    Sex Hormone Binding Globulin    Vitamin D 25 Hydroxy    Ferritin      6. Lipid screening  Lipid Panel      7. Diabetes mellitus screening  Hemoglobin A1C      8. Hyperlipidemia, unspecified hyperlipidemia type  Homocysteine      9. Body mass index (BMI) 30.0-30.9, adult  Vitamin D 25 Hydroxy      10. Pruritus, unspecified  Ferritin          Plan:      No follow-ups on file.  Orders Placed This Encounter   Procedures    Uric Acid     Standing Status:   Future     Expected Date:   03/31/2024     Expiration Date:   03/31/2025    Rheumatoid Factor     Standing Status:   Future     Expected Date:   03/31/2024     Expiration Date:   03/31/2025    ANA Screen with Reflex     Standing Status:   Future     Expected Date:   03/31/2024     Expiration Date:   03/31/2025    CBC     Standing Status:   Future     Expected Date:   03/31/2024     Expiration Date:   03/31/2025    Comprehensive Metabolic Panel     Standing Status:   Future     Expected Date:   03/31/2024     Expiration Date:   03/31/2025     Lipid Panel     PT FASTING 8-12 HOURS     Standing Status:   Future     Expected Date:   03/31/2024  Expiration Date:   03/31/2025     Is Patient Fasting?/# of Hours:   8-12 HOURS    Hemoglobin A1C     Standing Status:   Future     Expected Date:   03/31/2024     Expiration Date:   03/31/2025    Homocysteine     Standing Status:   Future     Expected Date:   03/31/2024     Expiration Date:   03/31/2025    Testosterone, free, total     Standing Status:   Future     Expected Date:   03/31/2024     Expiration Date:   03/31/2025    Estrogens, Fractionated     Standing Status:   Future     Expected Date:   03/31/2024     Expiration Date:   03/31/2025    DHEA-Sulfate     Standing Status:   Future     Expected Date:   03/31/2024     Expiration Date:   03/31/2025    Sex Hormone Binding Globulin     Standing Status:   Future     Expected Date:   03/31/2024     Expiration Date:   03/31/2025    Vitamin C     Standing Status:   Future     Expected Date:   03/31/2024     Expiration Date:   03/31/2025    Vitamin D 25 Hydroxy     Standing Status:   Future     Expected Date:   03/31/2024     Expiration Date:   03/31/2025    TSH     Standing Status:   Future     Expected Date:   03/31/2024     Expiration Date:   03/31/2025    T3, Free     Standing Status:   Future     Expected Date:   03/31/2024     Expiration Date:   03/31/2025    T4, Free     Standing Status:   Future     Expected Date:   03/31/2024     Expiration Date:   03/31/2025    Thyroid Antibodies     Standing Status:   Future     Expected Date:   03/31/2024     Expiration Date:   04/01/2025    Ferritin     Standing Status:   Future     Expected Date:   03/31/2024     Expiration Date:   03/31/2025    PSA Screening     Standing Status:   Future     Expected Date:   03/31/2024     Expiration Date:   03/31/2025     Changing masses:  Unclear etiology but suspect autoimmune or rheumatoid process? Check labs for more evaluation, dermatology has no explanation thus far    Check other labs at request of functional medicine MD/screening  routine labs  Declines vaccines due    given educational materials - see patient instructions.  Discussed use,benefit, and side effects of prescribed medications.  All patient questions answered.Pt voiced understanding. Reviewed health maintenance.  Instructed to continue currentmedications, diet and exercise.    Electronically signed by Burnard LOISE Bottcher, APRN - CNP,CNP on 03/31/2024 at 10:07 AM

## 2024-03-31 NOTE — Progress Notes (Signed)
 Visit Information    Have you changed or started any medications since your last visit including any over-the-counter medicines, vitamins, or herbal medicines? no   Have you stopped taking any of your medications? Is so, why? -  no  Are you having any side effects from any of your medications? - no    Have you seen any other physician or provider since your last visit?  no   Have you had any other diagnostic tests since your last visit?  no   Have you been seen in the emergency room and/or had an admission in a hospital since we last saw you?  no   Have you had your routine dental cleaning in the past 6 months?  no     Do you have an active MyChart account? If no, what is the barrier?  Yes    Patient Care Team:  Donna Burnard SAILOR, APRN - CNP as PCP - General (Nurse Practitioner)  Donna Burnard SAILOR, APRN - CNP as PCP - Empaneled Provider  Daiber, Lamar SAUNDERS, MD    Medical History Review  Past Medical, Family, and Social History reviewed and  contribute to the patient presenting condition    Health Maintenance   Topic Date Due    Shingles vaccine (1 of 2) Never done    Flu vaccine (1) Never done    COVID-19 Vaccine (1 - 2024-25 season) Never done    Annual Wellness Visit (Medicare Advantage)  03/26/2024    Depression Screen  11/13/2024    Prostate Specific Antigen (PSA) Screening or Monitoring  12/04/2024    Respiratory Syncytial Virus (RSV) Pregnant or age 18 yrs+ (1 - 1-dose 75+ series) 12/27/2024    Colorectal Cancer Screen  04/20/2025    DTaP/Tdap/Td vaccine (3 - Td or Tdap) 06/14/2026    Lipids  12/04/2028    Pneumococcal 50+ years Vaccine  Completed    AAA screen  Completed    Hepatitis C screen  Addressed    Hepatitis A vaccine  Aged Out    Hepatitis B vaccine  Aged Out    Hib vaccine  Aged Out    Polio vaccine  Aged Out    Meningococcal (ACWY) vaccine  Aged Out    Meningococcal B vaccine  Aged Out    A1C test (Diabetic or Prediabetic)  Discontinued

## 2024-04-01 ENCOUNTER — Inpatient Hospital Stay: Payer: Medicare (Managed Care) | Primary: Family

## 2024-04-01 DIAGNOSIS — Z125 Encounter for screening for malignant neoplasm of prostate: Principal | ICD-10-CM

## 2024-04-01 LAB — COMPREHENSIVE METABOLIC PANEL
ALT: 47 U/L (ref 10–50)
AST: 34 U/L (ref 10–50)
Albumin/Globulin Ratio: 1.6 (ref 1.0–2.5)
Albumin: 4.5 g/dL (ref 3.5–5.2)
Alkaline Phosphatase: 84 U/L (ref 40–129)
Anion Gap: 13 mmol/L (ref 9–16)
BUN: 17 mg/dL (ref 8–23)
CO2: 28 mmol/L (ref 20–31)
Calcium: 9.8 mg/dL (ref 8.6–10.4)
Chloride: 101 mmol/L (ref 98–107)
Creatinine: 0.9 mg/dL (ref 0.7–1.2)
Est, Glom Filt Rate: 90 mL/min/1.73m2 (ref >60)
Glucose: 78 mg/dL (ref 74–99)
Potassium: 4.6 mmol/L (ref 3.7–5.3)
Sodium: 142 mmol/L (ref 136–145)
Total Bilirubin: 0.4 mg/dL (ref 0.0–1.2)
Total Protein: 7.4 g/dL (ref 6.6–8.7)

## 2024-04-01 LAB — URIC ACID: Uric Acid: 6.3 mg/dL (ref 3.4–7.0)

## 2024-04-01 LAB — T3, FREE: T3, Free: 2.78 pg/mL (ref 2.00–4.40)

## 2024-04-01 LAB — T4, FREE: T4 Free: 1.4 ng/dL (ref 0.92–1.68)

## 2024-04-01 LAB — CBC
Hematocrit: 47.8 % (ref 40.7–50.3)
Hemoglobin: 15.5 g/dL (ref 13.0–17.0)
MCH: 30 pg (ref 25.2–33.5)
MCHC: 32.4 g/dL (ref 28.4–34.8)
MCV: 92.6 fL (ref 82.6–102.9)
MPV: 10.4 fL (ref 8.1–13.5)
NRBC Automated: 0 /100{WBCs}
Platelets: 332 k/uL (ref 138–453)
RBC: 5.16 m/uL (ref 4.21–5.77)
RDW: 13.6 % (ref 11.8–14.4)
WBC: 6.3 k/uL (ref 3.5–11.3)

## 2024-04-01 LAB — RHEUMATOID FACTOR: Rheumatoid Factor: <10 [IU]/mL (ref 0–13)

## 2024-04-01 LAB — FERRITIN: Ferritin: 298 ng/mL

## 2024-04-01 LAB — LIPID PANEL
Chol/HDL Ratio: 6.5 — ABNORMAL HIGH (ref <5.0)
Cholesterol, Total: 310 mg/dL — ABNORMAL HIGH (ref 0–199)
HDL: 48 mg/dL (ref >40)
LDL Cholesterol: 244 mg/dL — ABNORMAL HIGH (ref 0–100)
Triglycerides: 88 mg/dL (ref <150)
VLDL: 18 mg/dL (ref 1–30)

## 2024-04-01 LAB — TESTOSTERONE, FREE, TOTAL
Sex Hormone Binding: 40 nmol/L (ref 19–76)
Testosterone, Free: 62.7 pg/mL (ref 47.0–244.0)
Testosterone: 353 ng/dL (ref 193–740)

## 2024-04-01 LAB — HEMOGLOBIN A1C
Estimated Avg Glucose: 120 mg/dL
Hemoglobin A1C: 5.8 % (ref 4.0–6.0)

## 2024-04-01 LAB — CREATININE
Creatinine: 0.8 mg/dL (ref 0.7–1.2)
Est, Glom Filt Rate: >90 mL/min/1.73m2 (ref >60)

## 2024-04-01 LAB — VITAMIN D 25 HYDROXY: Vit D, 25-Hydroxy: 72.9 ng/mL (ref 30.0–100.0)

## 2024-04-01 LAB — VITAMIN B12: Vitamin B-12: 496 pg/mL (ref 232–1245)

## 2024-04-01 LAB — MAGNESIUM: Magnesium: 2.2 mg/dL (ref 1.6–2.4)

## 2024-04-01 LAB — TSH: TSH: 2.83 u[IU]/mL (ref 0.27–4.20)

## 2024-04-01 LAB — HOMOCYSTEINE: Homocysteine: 12.4 umol/L (ref 0.0–15.0)

## 2024-04-01 LAB — PSA SCREENING: PSA: 6.03 ng/mL — ABNORMAL HIGH (ref 0.00–4.00)

## 2024-04-01 LAB — CALCIUM: Calcium: 9.8 mg/dL (ref 8.6–10.4)

## 2024-04-02 LAB — ANA SCREEN WITH REFLEX
ANA: NEGATIVE
Anti ds DNA: 0.9 [IU]/mL (ref <10.0)
ENA Antibodies Screen: 0.2 U/mL (ref <0.7)

## 2024-04-02 LAB — THYROID ANTIBODIES
Thyroglobulin Ab: <12 [IU]/mL (ref 0.0–40.0)
Thyroid Peroxidase (Tpo) Ab: 21 [IU]/mL (ref 0.0–25.0)

## 2024-04-02 LAB — DHEA-SULFATE: DHEAS (DHEA Sulfate): 143 ug/dL (ref 33.6–246)

## 2024-04-04 LAB — VITAMIN C: Vitamin C: 27 umol/L (ref 23–114)

## 2024-04-05 ENCOUNTER — Encounter

## 2024-04-05 LAB — ESTROGENS, FRACTIONATED
Estradiol: 26.1 pg/mL (ref 10.0–42.0)
Estrogen Total: 44.1 pg/mL (ref 19.0–69.0)
Estrone: 18 pg/mL (ref 9.0–36.0)

## 2024-04-08 NOTE — Telephone Encounter (Signed)
 Patient called back , wants lisa to call him back.

## 2024-04-09 NOTE — Telephone Encounter (Signed)
"  Patient would like referral to dr naudad at [facility] and one for [place], he wants to see which one he can get into quicker.  Will pick up the [place] referral so he has it on hand  "
